# Patient Record
Sex: Male | Born: 1975 | Race: White | Hispanic: No | Smoking: Former smoker
Health system: Southern US, Community
[De-identification: ages and names within clinical notes are randomized; demographics above are authoritative.]

## PROBLEM LIST (undated history)

## (undated) DIAGNOSIS — E079 Disorder of thyroid, unspecified: Secondary | ICD-10-CM

## (undated) DIAGNOSIS — K589 Irritable bowel syndrome without diarrhea: Secondary | ICD-10-CM

## (undated) DIAGNOSIS — Z8489 Family history of other specified conditions: Secondary | ICD-10-CM

## (undated) DIAGNOSIS — I10 Essential (primary) hypertension: Secondary | ICD-10-CM

## (undated) DIAGNOSIS — E119 Type 2 diabetes mellitus without complications: Secondary | ICD-10-CM

## (undated) DIAGNOSIS — K219 Gastro-esophageal reflux disease without esophagitis: Secondary | ICD-10-CM

## (undated) DIAGNOSIS — G4730 Sleep apnea, unspecified: Secondary | ICD-10-CM

## (undated) DIAGNOSIS — Z87442 Personal history of urinary calculi: Secondary | ICD-10-CM

## (undated) DIAGNOSIS — M109 Gout, unspecified: Secondary | ICD-10-CM

## (undated) DIAGNOSIS — E785 Hyperlipidemia, unspecified: Secondary | ICD-10-CM

## (undated) HISTORY — DX: Gout, unspecified: M10.9

## (undated) HISTORY — DX: Sleep apnea, unspecified: G47.30

## (undated) HISTORY — PX: HERNIA REPAIR: SHX51

## (undated) HISTORY — PX: UPPER GASTROINTESTINAL ENDOSCOPY: SHX188

## (undated) HISTORY — DX: Disorder of thyroid, unspecified: E07.9

## (undated) HISTORY — DX: Essential (primary) hypertension: I10

## (undated) HISTORY — DX: Gastro-esophageal reflux disease without esophagitis: K21.9

## (undated) HISTORY — PX: COLONOSCOPY: SHX174

## (undated) HISTORY — DX: Hyperlipidemia, unspecified: E78.5

## (undated) HISTORY — DX: Type 2 diabetes mellitus without complications: E11.9

## (undated) HISTORY — DX: Irritable bowel syndrome without diarrhea: K58.9

---

## 2004-01-29 ENCOUNTER — Encounter: Payer: Self-pay | Admitting: Internal Medicine

## 2007-04-16 ENCOUNTER — Ambulatory Visit: Payer: Self-pay | Admitting: Internal Medicine

## 2007-05-08 ENCOUNTER — Ambulatory Visit: Payer: Self-pay | Admitting: Internal Medicine

## 2007-05-08 ENCOUNTER — Encounter: Payer: Self-pay | Admitting: Internal Medicine

## 2007-05-11 ENCOUNTER — Encounter: Payer: Self-pay | Admitting: Internal Medicine

## 2007-05-11 DIAGNOSIS — K219 Gastro-esophageal reflux disease without esophagitis: Secondary | ICD-10-CM

## 2007-05-11 HISTORY — DX: Gastro-esophageal reflux disease without esophagitis: K21.9

## 2007-05-18 LAB — CONVERTED CEMR LAB
AST: 29 units/L (ref 0–37)
Albumin: 4.4 g/dL (ref 3.5–5.2)
Alkaline Phosphatase: 41 units/L (ref 39–117)
BUN: 16 mg/dL (ref 6–23)
Basophils Absolute: 0 10*3/uL (ref 0.0–0.1)
Basophils Relative: 0.5 % (ref 0.0–1.0)
Bilirubin, Direct: 0.2 mg/dL (ref 0.0–0.3)
Chloride: 104 meq/L (ref 96–112)
Cholesterol: 166 mg/dL (ref 0–200)
Eosinophils Absolute: 0.2 10*3/uL (ref 0.0–0.6)
Eosinophils Relative: 2.2 % (ref 0.0–5.0)
Hemoglobin: 15.3 g/dL (ref 13.0–17.0)
MCV: 85 fL (ref 78.0–100.0)
Monocytes Absolute: 0.7 10*3/uL (ref 0.2–0.7)
Neutro Abs: 4.7 10*3/uL (ref 1.4–7.7)
Potassium: 4.3 meq/L (ref 3.5–5.1)
RDW: 12.4 % (ref 11.5–14.6)
Total Bilirubin: 1 mg/dL (ref 0.3–1.2)
Total CHOL/HDL Ratio: 7.3
Total Protein: 7.2 g/dL (ref 6.0–8.3)

## 2007-06-04 ENCOUNTER — Encounter: Payer: Self-pay | Admitting: Internal Medicine

## 2007-06-08 ENCOUNTER — Ambulatory Visit: Payer: Self-pay | Admitting: Internal Medicine

## 2007-06-08 DIAGNOSIS — R109 Unspecified abdominal pain: Secondary | ICD-10-CM | POA: Insufficient documentation

## 2007-06-09 ENCOUNTER — Encounter: Admission: RE | Admit: 2007-06-09 | Discharge: 2007-06-09 | Payer: Self-pay | Admitting: Internal Medicine

## 2007-06-11 ENCOUNTER — Telehealth (INDEPENDENT_AMBULATORY_CARE_PROVIDER_SITE_OTHER): Payer: Self-pay | Admitting: *Deleted

## 2007-06-22 ENCOUNTER — Ambulatory Visit: Payer: Self-pay | Admitting: Gastroenterology

## 2007-06-22 LAB — CONVERTED CEMR LAB
Lipase: 22 units/L (ref 11.0–59.0)
Sed Rate: 9 mm/hr (ref 0–20)

## 2007-07-10 ENCOUNTER — Ambulatory Visit: Payer: Self-pay | Admitting: Gastroenterology

## 2007-07-10 ENCOUNTER — Encounter: Payer: Self-pay | Admitting: Internal Medicine

## 2007-07-10 ENCOUNTER — Encounter: Payer: Self-pay | Admitting: Gastroenterology

## 2007-07-23 ENCOUNTER — Ambulatory Visit: Payer: Self-pay | Admitting: Gastroenterology

## 2007-08-10 ENCOUNTER — Telehealth: Payer: Self-pay | Admitting: Internal Medicine

## 2007-08-12 ENCOUNTER — Telehealth (INDEPENDENT_AMBULATORY_CARE_PROVIDER_SITE_OTHER): Payer: Self-pay | Admitting: *Deleted

## 2007-08-26 ENCOUNTER — Ambulatory Visit: Payer: Self-pay | Admitting: Internal Medicine

## 2007-08-29 ENCOUNTER — Observation Stay (HOSPITAL_COMMUNITY): Admission: EM | Admit: 2007-08-29 | Discharge: 2007-08-30 | Payer: Self-pay | Admitting: Emergency Medicine

## 2007-08-29 ENCOUNTER — Ambulatory Visit: Payer: Self-pay | Admitting: Cardiology

## 2007-08-31 ENCOUNTER — Ambulatory Visit: Payer: Self-pay | Admitting: Internal Medicine

## 2007-09-21 ENCOUNTER — Encounter: Payer: Self-pay | Admitting: Internal Medicine

## 2007-11-30 ENCOUNTER — Encounter: Payer: Self-pay | Admitting: Internal Medicine

## 2007-12-01 ENCOUNTER — Encounter: Payer: Self-pay | Admitting: Internal Medicine

## 2008-01-04 ENCOUNTER — Telehealth: Payer: Self-pay | Admitting: Internal Medicine

## 2008-01-05 ENCOUNTER — Encounter: Payer: Self-pay | Admitting: Internal Medicine

## 2008-01-08 DIAGNOSIS — G4733 Obstructive sleep apnea (adult) (pediatric): Secondary | ICD-10-CM | POA: Insufficient documentation

## 2008-01-08 DIAGNOSIS — K589 Irritable bowel syndrome without diarrhea: Secondary | ICD-10-CM | POA: Insufficient documentation

## 2008-01-08 DIAGNOSIS — I10 Essential (primary) hypertension: Secondary | ICD-10-CM | POA: Insufficient documentation

## 2008-01-08 DIAGNOSIS — G4730 Sleep apnea, unspecified: Secondary | ICD-10-CM

## 2008-01-08 HISTORY — DX: Irritable bowel syndrome, unspecified: K58.9

## 2008-01-08 HISTORY — DX: Sleep apnea, unspecified: G47.30

## 2008-09-07 ENCOUNTER — Telehealth: Payer: Self-pay | Admitting: Internal Medicine

## 2008-09-08 ENCOUNTER — Ambulatory Visit: Payer: Self-pay | Admitting: Internal Medicine

## 2008-09-08 DIAGNOSIS — I10 Essential (primary) hypertension: Secondary | ICD-10-CM

## 2008-09-08 DIAGNOSIS — L255 Unspecified contact dermatitis due to plants, except food: Secondary | ICD-10-CM | POA: Insufficient documentation

## 2008-09-08 HISTORY — DX: Essential (primary) hypertension: I10

## 2008-09-13 ENCOUNTER — Telehealth: Payer: Self-pay | Admitting: Internal Medicine

## 2008-10-10 ENCOUNTER — Ambulatory Visit: Payer: Self-pay | Admitting: Internal Medicine

## 2008-10-10 DIAGNOSIS — E079 Disorder of thyroid, unspecified: Secondary | ICD-10-CM

## 2008-10-10 HISTORY — DX: Disorder of thyroid, unspecified: E07.9

## 2008-10-11 ENCOUNTER — Telehealth: Payer: Self-pay | Admitting: Internal Medicine

## 2008-10-11 LAB — CONVERTED CEMR LAB: TSH: 2.02 u[IU]/mL

## 2009-04-05 ENCOUNTER — Encounter: Payer: Self-pay | Admitting: Internal Medicine

## 2009-04-10 ENCOUNTER — Ambulatory Visit: Payer: Self-pay | Admitting: Internal Medicine

## 2009-05-03 ENCOUNTER — Encounter: Payer: Self-pay | Admitting: Internal Medicine

## 2009-05-16 ENCOUNTER — Encounter: Payer: Self-pay | Admitting: Internal Medicine

## 2009-06-05 ENCOUNTER — Ambulatory Visit: Payer: Self-pay | Admitting: Internal Medicine

## 2009-09-05 ENCOUNTER — Ambulatory Visit: Payer: Self-pay | Admitting: Internal Medicine

## 2009-09-05 DIAGNOSIS — M109 Gout, unspecified: Secondary | ICD-10-CM

## 2009-09-05 HISTORY — DX: Gout, unspecified: M10.9

## 2009-09-05 LAB — CONVERTED CEMR LAB
TSH: 3.64 microintl units/mL (ref 0.35–5.50)
Uric Acid, Serum: 9.4 mg/dL — ABNORMAL HIGH (ref 4.0–7.8)

## 2009-09-08 ENCOUNTER — Telehealth: Payer: Self-pay | Admitting: Internal Medicine

## 2009-10-30 ENCOUNTER — Ambulatory Visit: Payer: Self-pay | Admitting: Internal Medicine

## 2010-04-13 ENCOUNTER — Ambulatory Visit: Payer: Self-pay | Admitting: Internal Medicine

## 2010-11-19 ENCOUNTER — Ambulatory Visit
Admission: RE | Admit: 2010-11-19 | Discharge: 2010-11-19 | Payer: Self-pay | Source: Home / Self Care | Attending: Internal Medicine | Admitting: Internal Medicine

## 2010-11-28 NOTE — Assessment & Plan Note (Signed)
Summary: PAIN IN R KNEE (GOUT?) // RS   Vital Signs:  Patient profile:   35 year old male Weight:      276 pounds Temp:     98.2 degrees F oral BP sitting:   120 / 90  (left arm) Cuff size:   regular  Vitals Entered By: Kathrynn Speed CMA (April 13, 2010 10:53 AM) CC: Pian in right knee / gout?   CC:  Pian in right knee / gout?Kyle [name]  History of Present Illness: 35 year old patient has a history of gout and also a history of treated hypertension.  For the past few days.  He has had a traumatic right knee pain and swelling.  He has had prior gout in this knee.  He states that over the years.  He has had probably 10 episodes of acute gouty arthritis.  Her uric acyl level was checked about 7 months ago and was 9.3.  Preventative medications were discussed with the wishes to avoid further medications.  He has been using indomethacin, which has not been very effective and has caused some mild stomach upset.  Current Medications (verified): 1)  Azor 5-40 Mg Tabs (Amlodipine-Olmesartan) .... One Daily 2)  Indomethacin 50 Mg Caps (Indomethacin) .... As Needed  Allergies (verified): 1)  ! Penicillin G Pot in Dextrose (Penicillin G Potassium in D5w)  Physical Exam  General:  overweight-appearing.  130/90overweight-appearing.   Msk:  right knee was minimally swollen, warm to touch and slightly tender   Impression & Recommendations:  Problem # 1:  GOUT, UNSPECIFIED (ICD-274.9)  Problem # 2:  ESSENTIAL HYPERTENSION, BENIGN (ICD-401.1)  His updated medication list for this problem includes:    Azor 5-40 Mg Tabs (Amlodipine-olmesartan) ..... One daily  His updated medication list for this problem includes:    Azor 5-40 Mg Tabs (Amlodipine-olmesartan) ..... One daily  Complete Medication List: 1)  Azor 5-40 Mg Tabs (Amlodipine-olmesartan) .... One daily 2)  Indomethacin 50 Mg Caps (Indomethacin) .... As needed 3)  Prednisone 20 Mg Tabs (Prednisone) .... One twice daily  Patient  Instructions: 1)  Please schedule a follow-up appointment in 6 months. 2)  Please schedule a follow-up appointment as needed. 3)  Limit your Sodium (Salt) to less than 2 grams a day(slightly less than 1/2 a teaspoon) to prevent fluid retention, swelling, or worsening of symptoms. Prescriptions: PREDNISONE 20 MG TABS (PREDNISONE) one twice daily  #20 x 0   Entered and Authorized by:   Gordy Savers  MD   Signed by:   Gordy Savers  MD on 04/13/2010   Method used:   Print then Give to Patient   RxID:   [id number] INDOMETHACIN 50 MG CAPS (INDOMETHACIN) as needed  #50 x 2   Entered and Authorized by:   Gordy Savers  MD   Signed by:   Gordy Savers  MD on 04/13/2010   Method used:   Print then Give to Patient   RxID:   [id number] AZOR 5-40 MG TABS (AMLODIPINE-OLMESARTAN) one daily  #90 x 6   Entered and Authorized by:   Gordy Savers  MD   Signed by:   Gordy Savers  MD on 04/13/2010   Method used:   Print then Give to Patient   RxID:   [id number]   Appended Document: PAIN IN R KNEE (GOUT?) // RS     Vitals Entered By: Duard Brady LPN (April 13, 2010 12:56 PM)  Allergies: 1)  ! Penicillin G Pot in  Dextrose (Penicillin G Potassium in D5w)   Complete Medication List: 1)  Azor 5-40 Mg Tabs (Amlodipine-olmesartan) .... One daily 2)  Indomethacin 50 Mg Caps (Indomethacin) .... As needed 3)  Prednisone 20 Mg Tabs (Prednisone) .... One twice daily  Other Orders: Depo- Medrol 80mg  (J1040) Admin of Therapeutic Inj  intramuscular or subcutaneous ([address])    Medication Administration  Injection # 1:    Medication: Depo- Medrol 80mg     Diagnosis: GOUT, UNSPECIFIED (ICD-274.9)    Route: IM    Site: RUOQ gluteus    Exp Date: 11.2013    Lot #: [id number]    Mfr: Pharmacia    Patient tolerated injection without complications    Given by: Duard Brady LPN (April 13, 2010 12:59 PM)  Orders Added: 1)  Depo- Medrol 80mg   [J1040] 2)  Admin of Therapeutic Inj  intramuscular or subcutaneous [60454]

## 2010-11-28 NOTE — Assessment & Plan Note (Signed)
Summary: 6 month rov/njr pt rsc/njr   Vital Signs:  [name] profile:   35 year old male Weight:      264 pounds BMI:     35.93 Temp:     97.5 degrees F oral BP sitting:   124 / 94  (left arm) Cuff size:   large  Vitals Entered By: Raechel Ache, RN (October 30, 2009 8:20 AM) CC: 6 mo ROV; still has gout pain both feet.   CC:  6 mo ROV; still has gout pain both feet.Marland Kitchen  History of Present Illness: Kyle [name] who is seen today for follow-up of his hypertension.  Last visit, diuretic therapy was discontinued and Azor 5-40 substituted.  He continues to have achiness primarily in the ankles of both feet.  Last month.  He was felt to have probable gout with inflammatory changes involving his right ankle.  He has tolerated his medication well. Since his last visit here.  He is adopted two children  Allergies: 1)  ! Penicillin G Pot in Dextrose (Penicillin G Potassium in D5w)  Past History:  Past Medical History: Reviewed history from 09/05/2009 and no changes required. GERD  Current Problems:  HYPERTENSION (ICD-401.9) SLEEP APNEA (ICD-780.57)-mild IBS (ICD-564.1) ABDOMINAL PAIN (ICD-789.00) FAMILY HISTORY OF CAD MALE 1ST DEGREE RELATIVE <60 (ICD-V16.49) GERD (ICD-530.81)  probable gout  Review of Systems  The [name] denies anorexia, fever, weight loss, weight gain, vision loss, decreased hearing, hoarseness, chest pain, syncope, dyspnea on exertion, peripheral edema, prolonged cough, headaches, hemoptysis, abdominal pain, melena, hematochezia, severe indigestion/heartburn, hematuria, incontinence, genital sores, muscle weakness, suspicious skin lesions, transient blindness, difficulty walking, depression, unusual weight change, abnormal bleeding, enlarged lymph nodes, angioedema, breast masses, and testicular masses.    Physical Exam  General:  mildly overweight, but pressure 140/94 Msk:   no inflammatory changes noted   Impression & Recommendations:  Problem  # 1:  GOUT, UNSPECIFIED (ICD-274.9)  Problem # 2:  ESSENTIAL HYPERTENSION, BENIGN (ICD-401.1)  His updated medication list for this problem includes:    Azor 5-40 Mg Tabs (Amlodipine-olmesartan) ..... One daily  His updated medication list for this problem includes:    Azor 5-40 Mg Tabs (Amlodipine-olmesartan) ..... One daily  Complete Medication List: 1)  Protonix 40 Mg Pack (Pantoprazole sodium) .Marland Kitchen.. 1 two times a day 2)  Zyrtec Allergy 10 Mg Tabs (Cetirizine hcl) .Marland Kitchen.. 1 once daily as needed 3)  Sucralfate 1 Gm Tabs (Sucralfate) .Marland Kitchen.. 1 qid 4)  Azor 5-40 Mg Tabs (Amlodipine-olmesartan) .... One daily 5)  Indomethacin 50 Mg Caps (Indomethacin) .Marland Kitchen.. 1 three times a day  Other Orders: Prescription Created Electronically [phone])  [name] Instructions: 1)  Limit your Sodium (Salt). 2)  It is important that you exercise regularly at least 20 minutes 5 times a week. If you develop chest pain, have severe difficulty breathing, or feel very tired , stop exercising immediately and seek medical attention. 3)  Please schedule a follow-up appointment in 3 months. Prescriptions: INDOMETHACIN 50 MG CAPS (INDOMETHACIN) 1 three times a day  #90 x 2   Entered and Authorized by:   Gordy Savers  MD   Signed by:   Gordy Savers  MD on 10/30/2009   Method used:   Print then Give to [name]   RxID:   [id number] AZOR 5-40 MG TABS (AMLODIPINE-OLMESARTAN) one daily  #90 x 6   Entered and Authorized by:   Gordy Savers  MD   Signed by:   Gordy Savers  MD on 10/30/2009   Method used:   Print then Give to [name]   RxID:   [id number] PROTONIX 40 MG  PACK (PANTOPRAZOLE SODIUM) 1 two times a day  #180 x 6   Entered and Authorized by:   Gordy Savers  MD   Signed by:   Gordy Savers  MD on 10/30/2009   Method used:   Print then Give to [name]   RxID:   [id number] INDOMETHACIN 50 MG CAPS (INDOMETHACIN) 1 three times a day  #90 x 2   Entered and  Authorized by:   Gordy Savers  MD   Signed by:   Gordy Savers  MD on 10/30/2009   Method used:   Electron[facility] to        [facility] (retail)       [address]       [place], [place]  [address]       Ph: [phone] or [phone]       Fax: [phone]   RxID:   [id number] AZOR 5-40 MG TABS (AMLODIPINE-OLMESARTAN) one daily  #90 x 6   Entered and Authorized by:   Gordy Savers  MD   Signed by:   Gordy Savers  MD on 10/30/2009   Method used:   [facility]        [facility] (retail)       [address]       [place], [place]  [address]       Ph: [phone] or [phone]       Fax: [phone]   RxID:   [id number] PROTONIX 40 MG  PACK (PANTOPRAZOLE SODIUM) 1 two times a day  #180 x 6   Entered and Authorized by:   Gordy Savers  MD   Signed by:   Gordy Savers  MD on 10/30/2009   Method used:   [facility]        [facility] (retail)       [address]       [place], [place]  [address]       Ph: [phone] or [phone]       Fax: [phone]   RxID:   [id number]

## 2010-11-29 NOTE — Assessment & Plan Note (Signed)
Summary: ? gout//ccm   Vital Signs:  Patient profile:   35 year old male Weight:      284 pounds Temp:     99.6 degrees F oral BP sitting:   130 / 80  (left arm) Cuff size:   large  Vitals Entered By: Duard Brady LPN (November 19, 2010 3:30 PM) CC: c/o pain and ?swelling to ()R) foot x1wk Is Patient Diabetic? No   CC:  c/o pain and ?swelling to ()R) foot x1wk.  History of Present Illness: 35 year old C. history of treated hypertension.  He has exogenous obesity.  He also has a history of hyperuricemia and recurrent gouty arthritis.  This has affected his right foot and ankle on at least 3 prior episodes.  It is also affected his knee.  For the past week.  He has had increasing pain involving his lateral foot area.  He has been using indomethacin and he had a few pills of prednisone and he is much improved  Allergies: 1)  ! Penicillin G Pot in Dextrose (Penicillin G Potassium in D5w)  Past History:  Past Medical History: GERD  Current Problems:  HYPERTENSION (ICD-401.9) SLEEP APNEA (ICD-780.57)-mild IBS (ICD-564.1) ABDOMINAL PAIN (ICD-789.00) FAMILY HISTORY OF CAD MALE 1ST DEGREE RELATIVE <60 (ICD-V16.49) GERD (ICD-530.81)  GOUT   Review of Systems       The patient complains of difficulty walking.  The patient denies anorexia, fever, weight loss, weight gain, vision loss, decreased hearing, hoarseness, chest pain, syncope, dyspnea on exertion, peripheral edema, prolonged cough, headaches, hemoptysis, abdominal pain, melena, hematochezia, severe indigestion/heartburn, hematuria, incontinence, genital sores, muscle weakness, suspicious skin lesions, transient blindness, depression, unusual weight change, abnormal bleeding, enlarged lymph nodes, angioedema, breast masses, and testicular masses.    Physical Exam  General:  overweight-appearing.  130/80overweight-appearing.   Msk:  tenderness along the dorsal and lateral aspect of the right foot Pulses:  R and L  carotid,radial,femoral,dorsalis pedis and posterior tibial pulses are full and equal bilaterally   Impression & Recommendations:  Problem # 1:  GOUT, UNSPECIFIED (ICD-274.9)  His updated medication list for this problem includes:    Allopurinol 300 Mg Tabs (Allopurinol) ..... One daily  His updated medication list for this problem includes:    Allopurinol 300 Mg Tabs (Allopurinol) ..... One daily  Problem # 2:  HYPERTENSION (ICD-401.9)  His updated medication list for this problem includes:    Azor 5-40 Mg Tabs (Amlodipine-olmesartan) ..... One daily  His updated medication list for this problem includes:    Azor 5-40 Mg Tabs (Amlodipine-olmesartan) ..... One daily  Complete Medication List: 1)  Azor 5-40 Mg Tabs (Amlodipine-olmesartan) .... One daily 2)  Indomethacin 50 Mg Caps (Indomethacin) .... As needed 3)  Prednisone 20 Mg Tabs (Prednisone) .... One twice daily 4)  Allopurinol 300 Mg Tabs (Allopurinol) .... One daily  Patient Instructions: 1)  Limit your Sodium (Salt) to less than 2 grams a day(slightly less than 1/2 a teaspoon) to prevent fluid retention, swelling, or worsening of symptoms. 2)  It is important that you exercise regularly at least 20 minutes 5 times a week. If you develop chest pain, have severe difficulty breathing, or feel very tired , stop exercising immediately and seek medical attention. 3)  You need to lose weight. Consider a lower calorie diet and regular exercise.  Prescriptions: ALLOPURINOL 300 MG TABS (ALLOPURINOL) one daily  #90 x 4   Entered and Authorized by:   Gordy Savers  MD   Signed by:  Gordy Savers  MD on 11/19/2010   Method used:   [facility]        [facility] ([facility]ail)       [address]       [place], [place]  [address]       Ph: [phone] or [phone]       Fax: [phone]   RxID:   [id number] PREDNISONE 20 MG TABS (PREDNISONE) one twice daily  #20 x 1   Entered and Authorized  by:   Gordy Savers  MD   Signed by:   Gordy Savers  MD on 11/19/2010   Method used:   [facility]        [id number]       [address]       [place], [place]  [address]       Ph: [phone] or [phone]       Fax: [phone]   RxID:   [id number]    Orders Added: 1)  Est. Patient Level III [[address]]

## 2011-03-12 NOTE — Assessment & Plan Note (Signed)
[facility] OFFICE NOTE   Kyle, [name]                         MRN:          [id number]  DATE:04/16/2007                            DOB:          [birth year]    A 35 year old gentleman who was seen today to establish with our  practice.  He has a history of gastroesophageal reflux disease, and has  been on Protonix for a few months.  The past 3 days he has had  increasing epigastric pain, especially nocturnal, in spite of  medications.  He describes some bloating, belching, minimal nausea, but  no active emesis.  No change in his bowel habits.  There does not appear  to be any new stressors in his life, although he has recently relocated,  and does have a new job.  He is married without children.  Nondrinker  and nonsmoker.   PAST MEDICAL HISTORY:  Fairly unremarkable.  There has been no history  of peptic ulcer disease.  He had a hernia repair in the year 2000 in  [place].   HE HAS A PENICILLIN ALLERGY.   Medical regimen includes Protonix 40 mg daily.   FAMILY HISTORY:  Father is age 62 with a history of prostate cancer.  He  is status post CABG, and has a thyroid disorder.  Mother age 78 also has  coronary artery disease, status post prior MI.  Two half sisters, 1 with  history of cervical cancer.   EXAMINATION:  Reveals an overweight male that appears well.  No acute  distress.  Blood pressure was 140/90.  Weight 255.  Fundi, ears, nose, and throat clear.  NECK:  No bruits or adenopathy.  No thyroid enlargement.  CHEST:  Clear.  CARDIOVASCULAR EXAM:  Normal heart sounds.  No murmurs.  ABDOMEN:  Obese, soft and non-tender.  No organomegaly.  No bruits  appreciated.  EXTERNAL GENITALIA:  Normal.  RECTAL EXAM:  No obvious hemorrhoids.  Stool Hemoccult negative.  EXTREMITIES:  Negative with full peripheral pulses.   IMPRESSION:  1. Abdominal pain.  2. Probable gastroesophageal reflux disease.   DISPOSITION:  We will place the patient on aggressive antireflux regimen  including weight loss.  Instructions were dispensed.  For the short  term, we will increase his Protonix to a b.i.d.  regimen.  He will be rechecked in 2 weeks, and will have a fasting  laboratory panel done at that  time.  Depending on his clinical response, we will consider additional  testing for H. pylori, possible gallbladder disease, etc., depending on  his clinical response.     Gordy Savers, MD  Electronically Signed    PFK/MedQ  DD: 04/16/2007  DT: 04/16/2007  Job #: [id number]

## 2011-03-12 NOTE — Assessment & Plan Note (Signed)
[facility]                         GASTROENTEROLOGY OFFICE NOTE   NAME:Kyle [name], Kyle [name]                         MRN:          [id number]  DATE:07/23/2007                            DOB:          1976-01-08    The patient returns and is still having episodes of subxiphoid pain  rather severe, that last up to an hour in duration, usually occurring at  night without other precipitating or alleviating elements.  There are no  specific hepatobiliary, other gastrointestinal, or any cardiovascular  pulmonary symptoms related to this pain.  He has been on twice a day  Pantoprazole and also twice a day anticholinergic therapy with Levsin  without improvement.  He does have chronic underlying IBS with  alternating diarrhea and constipation.  As per previous notes, previous  ultrasound examination was apparently unremarkable, although, I do not  have that specific report.  I did endoscopy and colonoscopy on July 10, 2007, both of which were entirely normal.   LABORATORY DATA:  Pattern consistent with inflammatory bowel disease  with positive ASCA IgA and IgG antibodies.  Serologic markers for celiac  disease were unremarkable.  Small bowel biopsy also was obtained and  showed no evidence of abnormalities.   PHYSICAL EXAMINATION:  VITAL SIGNS:  The patient weighs 245 pounds,  blood pressure 132/98.  Pulse 68 and regular.  I cannot see where he has  had previous hypertension.  ABDOMEN:  Unremarkable.  HEART:  Regular rhythm without murmurs, rubs, or gallops.  CHEST:  Clear.   ASSESSMENT:  1. Chronic irritable bowel syndrome with alternating diarrhea and      constipation.  He has positive serological panel for Crohn's.  It      is interesting and should be kept in mind, although, I do not think      he has active inflammatory bowel disease at this point.  2. Recurrent attacks of subxiphoid pain despite twice a day PPI      therapy.  Etiology diagnoses  should include dysfunctional      gallbladder, esophageal spasm, or functional symptomatology.  3. Borderline essential hypertension which needs to be followed.  4. History of lactose intolerance.   RECOMMENDATIONS:  1. Check CCK-HIDA hepatobiliary scanning to see if he has gallbladder      dysfunction.  2. Should CCK-HIDA scan be normal, we will proceed with esophageal      monometry and 24-hour pH probe testing.  3. Consider small bowel enterocapsular examination depending on      clinical course.  4. Continue empiric reflux treatment with twice a day Protonix until      workup completed.     Vania Rea. Jarold Motto, MD, Caleen Essex, FAGA  Electronically Signed    DRP/MedQ  DD: 07/23/2007  DT: 07/23/2007  Job #: [id number]   cc:   Gordy Savers, MD

## 2011-03-12 NOTE — H&P (Signed)
NAME:  Kyle [name], Kyle [name] NO.:  [id number]   MEDICAL RECORD NO.:  [id number]          PATIENT TYPE:  EMS   LOCATION:  [facility]                         FACILITY:  [facility]   PHYSICIAN:  Dorian Pod, ACNP  DATE OF BIRTH:  30-Apr-1976   DATE OF ADMISSION:  08/29/2007  DATE OF DISCHARGE:                              HISTORY & PHYSICAL   PRIMARY CARDIOLOGIST:  New, being seen by Dr. Olga Millers.   PRIMARY CARE:  Eleonore Chiquito.   GI:  Dr. Jarold Motto.   Mr. Lindstrom is a 35 year old Caucasian gentleman with no known history of  coronary artery disease, who presents to [facility] Emergency  Department today complaining of chest discomfort x3 days.  Mr. Purdy has  no previous cardiac workup.  He has been worked up, however, for ongoing  irritable bowel syndrome type symptoms.  He states he underwent an  endoscopy and colonoscopy through [facility] in September.  In  discussion with the patient and his wife, he states everything was  negative. However, in pulling up office notes from Dr. Jarold Motto,  questionable possibility of Crohn disease.  The patient states he was  not pleased with the service he received through the GI office and has  an appointment with [facility] later this month for a workup.  Mr.  Calleros also complains of rectal bleeding x2 days; one episode on Thursday  and 2 episodes on Friday.  He states episodes occurred with change in  consistency of stool, although Mr. Littler states his irritable bowel  symptoms can be diarrhea or constipation.  Not very clear as to what the  episodes entail, as he is rather in answering then, but apparently he  has chronic abdominal pain, chronic GERD-type symptoms with a lot of  reflux, acid indigestion.  He states this chest discomfort he had 3 days  ago, however, was different from his normal reflux symptoms, although it  does seem to correlate with his abdominal discomfort.  He describes it  as a localized burning in  his substernal area with increased belching.  He states it started Thursday night after he had gone to bed.  He took  some Pepto-Bismol without relief, but finally fell asleep.  Friday, he  had a bloody stool.  The chest discomfort returned.  He described it as  a burning epigastric discomfort.  He also associated it with a headache.  The chest discomfort lasted around an hour and then resolved.  This  morning, he was watching TV.  He had some chest discomfort again.  He  described it as a soreness, also associated with constant abdominal  pain.  He does complain of increased fatigue this morning.  With the  chest discomfort, he decided to have his wife check his blood pressure  and his blood pressure was 160/100.  He states he has never been treated  for hypertension but Dr. Kirtland Bouchard just watches his blood pressure because it  has been mildly elevated in the past; mildly meaning 130s/90s.  Mr.  Lamountain is in no acute distress at this time, denying any current  discomfort.  However, when he initially arrived in the emergency room he  complained of chest discomfort, rate of 3 or 4 on a scale of 1-10.  He  states it was relieved with aspirin and nitroglycerin sublingual.   ALLERGIES:  PENICILLIN.   MEDICATIONS:  1. Protonix b.i.d.  2. Hyomax-SR 0.375 mg b.i.d.   PAST MEDICAL HISTORY:  Previous EGD colonoscopy, questionable Crohn  disease, pending further workup at [facility], and borderline  hypertension.   SOCIAL HISTORY:  He lives in [place] with his wife.  He is an  Art gallery manager.  Denies any tobacco, EtOH, drugs or herbal medication use.  He  tries to follow a bland diet.   FAMILY HISTORY:  Both parents alive.  Mother had an MI in her 35s.  Father with CABG in his 35s.   REVIEW OF SYSTEMS:  Positive for a 30-pound weight loss in 6 months,  unintentional; occasional headaches; chest discomfort as described  above; abdominal discomfort; diarrhea; constipation; abdominal pain;   bloody stool x2 days.   PHYSICAL EXAMINATION:  Temperature 97.3; heart rate initially 105,  currently 78; respirations 18; blood pressure 159/93.  Mr. Furtick is in no acute distress.  No a jugular vein distention.  No carotid bruits.  HEENT:  Unremarkable.  CARDIOVASCULAR:  Exam reveals S1 and S2, regular rate and rhythm.  LUNGS:  Clear to auscultation.  SKIN:  Warm and dry.  ABDOMEN:  Soft, nontender, positive bowel sounds.  LOWER EXTREMITIES: Without clubbing, cyanosis or edema.  NEUROLOGIC:  Alert and oriented x3.   Chest x-ray showing no acute findings.  EKG sinus rhythm at a rate of  99.   LABORATORY DATA:  H&H 15.2 and 43.5, WBC 6.1, platelets 222,000.  Sodium  141, potassium 3.9, BUN and creatinine 8 and 0.9 with glucose 116.  Dr.  Olga Millers in to examine and assess patient with complaints of chest  discomfort, somewhat atypical with ongoing symptoms of GERD, irritable  bowel syndrome, now with x3 episodes of bright-red blood in stool.  EKG:  Normal sinus rhythm with no ST changes.   The patient will be admitted for observation, rule out MI.  No further  cardiac workup.  If enzymes are negative, continue Protonix.  Recheck  CBC in the a.m.  Follow up outpatient with GI.  Discontinue aspirin.      Dorian Pod, ACNP     MB/MEDQ  D:  08/29/2007  T:  08/31/2007  Job:  [id number]

## 2011-03-12 NOTE — Discharge Summary (Signed)
[name], [name]                ACCOUNT NO.:  [id number]   MEDICAL RECORD NO.:  [id number]          PATIENT TYPE:  OBV   LOCATION:  3735                         FACILITY:  [facility]   PHYSICIAN:  Madolyn Frieze. Jens Som, MD, FACCDATE OF BIRTH:  [birth year]   DATE OF ADMISSION:  08/29/2007  DATE OF DISCHARGE:  08/30/2007                               DISCHARGE SUMMARY   DISCHARGING PHYSICIAN:  Dr. Olga Millers.   PRIMARY CARDIOLOGIST:  New, has been seen by Dr. Jens Som.   PRIMARY CARE PHYSICIAN:  Dr. Eleonore Chiquito.   GASTROENTEROLOGIST:  Dr. Jarold Motto.   DISCHARGE DIAGNOSES:  1. Chest pain, negative cardiac workup, no further cardiac workup      needed at this time.  2. Gastroesophageal reflux disease.  3. Irritable bowel syndrome.  4. Questionable Crohn disease with hematochezia.  5. Borderline hypertension.   HOSPITAL COURSE:  [name] is a 35 year old Caucasian gentleman with no  known history of coronary artery disease, who presents to [facility]  Emergency Room complaining of chest pain for 3 days, rather atypical  chest discomfort; he states it is different from his GERD symptoms.  Chest pain is described as burning associated with increased belching  and abdominal discomfort, patient complaining of 3 episodes of bright  red blood in stool over the last 2 days with ongoing abdominal  discomfort, GERD symptoms and 30-pound weight loss over this last 6  months, in the process of being worked up by GI, also pending further  evaluation by GI Department a [facility] later this month, patient  admitted for observation.  No further episodes of chest discomfort, no  loss of blood during the admission.  Patient is being discharged home  with instructions to follow up with GI at [facility] as scheduled.  No  further cardiac workup at this time.   LABORATORY WORK AT TIME OF DISCHARGE:  H&H 14 and 39.3, WBC 6.3,  platelets 210,000.  Potassium 4.2, BUN and creatinine 10 and  0.87.   DURATION OF DISCHARGE ENCOUNTER:  Less than 30 minutes.      Dorian Pod, ACNP      Madolyn Frieze. Jens Som, MD, [facility]  Electronically Signed    MB/MEDQ  D:  08/30/2007  T:  08/31/2007  Job:  [id number]   cc:   Vania Rea. Jarold Motto, MD, [facility]aleen Essex, FAGA

## 2011-03-12 NOTE — Assessment & Plan Note (Signed)
[facility]                         GASTROENTEROLOGY OFFICE NOTE   JADRIEL, SAXER                         MRN:          [id number]  DATE:06/22/2007                            DOB:          Mar 30, 1976    HISTORY:  [name], Kyle [name]. 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 Gordy Savers for  evaluation of epigastric abdominal pain.  The patient has had acid  reflux for several years, initially treated by a physician in [place],  [place].  He has been on Prilosec and/or Protonix but saw Dr.  Amador Cunas approximately one month ago, with periodic episodes of  rather acute abdominal discomfort.  These pains were in the epigastric  area and associated with mild nausea but no emesis, no hepatobiliary  complaints or symptoms.  The laboratory data was all unremarkable  including an upper abdominal ultrasound examination.  He was placed on  Levsinex twice daily, along with Protonix 40 mg from once a day to twice  daily, and really has been doing somewhat better.  He does have belching  and burping but denies pre-regurgitation or dysphagia.  He has had no  clay-colored stools, dark urine, icterus, fever or chills.   He does have chronic irritable bowel syndrome, alternating diarrhea and  constipation, but denies melena or hematochezia.  He has never had  endoscopic or barium studies.  He has a variety of food intolerance,  including lactose.  He has never been checked for celiac disease.  He  denies any history of osteoporosis, anemia or current skin rashes, but  does have frequent aphthous erosions in his mouth and palate.  He denies  foreign travel and no infectious disease exposure, no use of alcohol,  cigarettes or NSAIDs.  He has had no significant anorexia or weight  loss.   PAST MEDICAL/SURGICAL HISTORY:  1. Remarkable for labile hypertension.  2. Sleep apnea.  3. Previous right inguinal  hernia repair.   MEDICAL DECISION MAKING:  1. Levsin 0.375 mg twice daily.  2. Protonix 40 mg twice daily.  3. Metamucil twice daily.   ALLERGIES:  In the past he has had reaction to PENICILLIN.   FAMILY HISTORY:  Remarkable for an older uncle having colon cancer.  An  aunt with colon polyps.  His father had prostate cancer.   SOCIAL HISTORY:  The patient is married and lives with his wife.  He has  a Curator.  He has never smoked or used ethanol.   REVIEW OF SYSTEMS:  Otherwise noncontributory, without any  cardiovascular, pulmonary, genitourinary, neurological, orthopedic,  endocrine or dermatologic problems.   PHYSICAL EXAMINATION:  GENERAL:  He is a healthy-appearing white male,  in no distress.  He appears his stated age.  VITAL SIGNS:  He is 6 feet 2 inches tall and weighs 245 pounds.  Blood  pressure 132/78, pulse 78 and regular.  HEENT/NECK:  I could not appreciate stigmata of chronic liver disease or  thyromegaly.  He did have a healing erosion on the soft palate.  No  lymphadenopathy noted.  CHEST:  Entirely clear.  HEART:  He was in a regular rhythm without murmurs, gallops or rubs.  ABDOMEN:  There was no hepatosplenomegaly, abdominal mass or tenderness  except in the epigastric area to deep palpation.  Bowel sounds normal.  EXTREMITIES:  Peripheral extremities were unremarkable.  NEUROLOGIC:  Mental status was clear.  RECTAL:  Exam was deferred.   The patient had an ultrasound exam performed at [facility] on June 08, 2007, which was entirely normal except for  increased echogenicity of the liver, suggesting fatty infiltration.  The  pancreas was poorly visualized.   ASSESSMENT:  1. Chronic acid reflux disease - rule out Barrett's mucosa.  2. Recurrent epigastric abdominal pain, of unexplained etiology -      still think it is possible the patient has very small gallstones      which are giving him attacks.  3. History  of chronic irritable bowel syndrome - rule out inflammatory      bowel disease.  4. Lactose intolerance - rule out celiac disease.   RECOMMENDATIONS:  1. Check celiac panel and inflammatory bowel disease panel.  2. Continue current medications per Dr. Amador Cunas.  3. Outpatient endoscopy and colonoscopy as soon as possible.  4. Consider small bowel series.  5. Lactose-free diet as tolerated.     Vania Rea. Jarold Motto, MD, Caleen Essex, FAGA  Electronically Signed    DRP/MedQ  DD: 06/22/2007  DT: 06/23/2007  Job #: [id number]   cc:   Gordy Savers, MD

## 2011-03-15 NOTE — Assessment & Plan Note (Signed)
[facility]                         GASTROENTEROLOGY OFFICE NOTE   DAJOUR, PIERPOINT                         MRN:          [id number]  DATE:07/30/2007                            DOB:          [birth year]    TELEPHONE CALL:  The patient is a patient of Dr. Norval Gable.  He has a working diagnosis  of irritable bowel syndrome.  He had a serologic panel positive for  Crohn's disease.  EGD and colonoscopy were unrevealing.  He and his wife  had called dissatisfied with a lack of clear-cut explanation and in my  roll as continuous quality improvement coordinator for the [facility], I called to answer his questions.  He plans to  transfer his care to Bernette Redbird, M.D., in the future.   He stated that he felt that the medical care was appropriate but he was  left with questions about his diagnosis and the rationale for that.  We  had a conversation about irritable bowel syndrome and Crohn's disease.  I explained to him that it certainly looks like he has irritable bowel  syndrome at this time.  He has the positive [employer] serologic panel  for Crohn's and that sometimes people have Crohn's disease that does not  really manifest itself in the mucosa of the gut until later on in life  and in the interim, they act and are treated as if they have irritable  bowel syndrome.  He has had some repeated attacks of subxiphoid pain,  Dr. Jarold Motto had recommended a [facility] HIDA scan since previous ultrasound  was reported to be negative for stones, and I explained the rationale  for this study.  He will follow up with Dr. Matthias Hughs on this as well as  the possible small-bowel capsule endoscopy study, Dr. Jarold Motto had  mentioned that is a possibility as well.   Dr. Jarold Motto had also indicated that there may be gluten sensitivity  and had made a recommendation to continue with gluten-free diet, I think  there was a miscommunication in there in that the  patient had not been  on it, but we clarified that as well.     Iva Boop, MD,FACG  Electronically Signed    CEG/MedQ  DD: 07/30/2007  DT: 07/31/2007  Job #: [id number]   cc:   Vania Rea. Jarold Motto, MD, Caleen Essex, FAGA

## 2011-06-17 ENCOUNTER — Other Ambulatory Visit: Payer: Self-pay | Admitting: Internal Medicine

## 2011-07-30 ENCOUNTER — Ambulatory Visit (INDEPENDENT_AMBULATORY_CARE_PROVIDER_SITE_OTHER): Payer: BC Managed Care – PPO | Admitting: Internal Medicine

## 2011-07-30 ENCOUNTER — Encounter: Payer: Self-pay | Admitting: Internal Medicine

## 2011-07-30 DIAGNOSIS — I10 Essential (primary) hypertension: Secondary | ICD-10-CM

## 2011-07-30 DIAGNOSIS — J069 Acute upper respiratory infection, unspecified: Secondary | ICD-10-CM

## 2011-07-30 MED ORDER — HYDROCODONE-HOMATROPINE 5-1.5 MG/5ML PO SYRP: 5.0000 mL | ORAL_SOLUTION | Freq: Four times a day (QID) | ORAL | Status: AC | PRN

## 2011-07-30 NOTE — Progress Notes (Signed)
  Subjective:    Patient ID: Kyle [name], male    DOB: 02/27/1976, 35 y.o.   MRN: [id number]  HPI  35 year old patient who presents with a chief complaint of weakness cough low-grade fever chest congestion. Cough is largely nonproductive. His wife and 2 children have had similar illness he has felt poorly with fatigue weakness and has basically spent the weekend in bed. No recent fever or chills. He feels improved but still quite weak. He does have treated hypertension    Review of Systems  Constitutional: Positive for fever, appetite change and fatigue. Negative for chills.  HENT: Positive for congestion and rhinorrhea. Negative for hearing loss, ear pain, sore throat, trouble swallowing, neck stiffness, dental problem, voice change and tinnitus.   Eyes: Negative for pain, discharge and visual disturbance.  Respiratory: Positive for cough. Negative for chest tightness, wheezing and stridor.   Cardiovascular: Negative for chest pain, palpitations and leg swelling.  Gastrointestinal: Negative for nausea, vomiting, abdominal pain, diarrhea, constipation, blood in stool and abdominal distention.  Genitourinary: Negative for urgency, hematuria, flank pain, discharge, difficulty urinating and genital sores.  Musculoskeletal: Negative for myalgias, back pain, joint swelling, arthralgias and gait problem.  Skin: Negative for rash.  Neurological: Negative for dizziness, syncope, speech difficulty, weakness, numbness and headaches.  Hematological: Negative for adenopathy. Does not bruise/bleed easily.  Psychiatric/Behavioral: Negative for behavioral problems and dysphoric mood. The patient is not nervous/anxious.        Objective:   Physical Exam  Constitutional: He is oriented to person, place, and time. He appears well-developed and well-nourished. No distress.       Blood pressure 130/90  HENT:  Head: Normocephalic.  Right Ear: External ear normal.  Left Ear: External ear normal.  Eyes:  Conjunctivae and EOM are normal.  Neck: Normal range of motion. Thyromegaly present.  Cardiovascular: Normal rate and normal heart sounds.   Pulmonary/Chest: Effort normal and breath sounds normal. No respiratory distress. He has no wheezes. He has no rales.       Frequent paroxysms of coughing during the exam  Abdominal: Bowel sounds are normal.  Musculoskeletal: Normal range of motion. He exhibits no edema and no tenderness.  Neurological: He is alert and oriented to person, place, and time.  Psychiatric: He has a normal mood and affect. His behavior is normal.          Assessment & Plan:    Viral URI. Will treat symptomatically with Hydromet. Hypertension. Well controlled we'll continue his present regimen

## 2011-07-30 NOTE — Patient Instructions (Signed)
Get plenty of rest, Drink lots of  clear liquids, and use Tylenol or ibuprofen for fever and discomfort.    Call or return to clinic prn if these symptoms worsen or fail to improve as anticipated.  Please check your blood pressure on a regular basis.  If it is consistently greater than 150/90, please make an office appointment.

## 2011-08-06 LAB — COMPREHENSIVE METABOLIC PANEL
ALT: 30
AST: 22
AST: 22
Albumin: 3.8
Albumin: 4
Alkaline Phosphatase: 37 — ABNORMAL LOW
Alkaline Phosphatase: 42
BUN: 10
CO2: 30
Chloride: 102
Chloride: 106
Creatinine, Ser: 0.81
Creatinine, Ser: 0.87
GFR calc Af Amer: >60
Glucose, Bld: 94
Potassium: 4.2
Total Bilirubin: 0.6
Total Protein: 6.6
Total Protein: 6.9

## 2011-08-06 LAB — POCT CARDIAC MARKERS
Operator id: 265201
Troponin i, poc: <0.05

## 2011-08-06 LAB — CBC
HCT: 43.5
Hemoglobin: 14
MCHC: 35
MCV: 82.8
MCV: 83
RBC: 4.82
RDW: 12.5
WBC: 6.3

## 2011-08-06 LAB — CARDIAC PANEL(CRET KIN+CKTOT+MB+TROPI)
Relative Index: INVALID
Troponin I: 0.01

## 2011-08-06 LAB — I-STAT 8, (EC8 V) (CONVERTED LAB)
BUN: 8
Bicarbonate: 26.9 — ABNORMAL HIGH
Glucose, Bld: 116 — ABNORMAL HIGH
HCT: 45
Sodium: 141
TCO2: 28
pCO2, Ven: 42.3 — ABNORMAL LOW
pH, Ven: 7.411 — ABNORMAL HIGH

## 2011-08-06 LAB — CK TOTAL AND CKMB (NOT AT ARMC)
CK, MB: 0.9
Relative Index: INVALID
Total CK: 88

## 2011-08-06 LAB — PROTIME-INR: INR: 1.1

## 2011-08-06 LAB — DIFFERENTIAL
Eosinophils Absolute: 0.1
Eosinophils Relative: 2
Lymphocytes Relative: 31
Lymphs Abs: 1.9
Monocytes Absolute: 0.5
Neutrophils Relative %: 58

## 2011-08-06 LAB — POCT I-STAT CREATININE: Creatinine, Ser: 0.9

## 2011-08-06 LAB — TSH: TSH: 1.373

## 2011-08-06 LAB — LIPID PANEL: Total CHOL/HDL Ratio: 6.6

## 2011-08-06 LAB — APTT
aPTT: 34
aPTT: 36

## 2011-08-16 ENCOUNTER — Ambulatory Visit (INDEPENDENT_AMBULATORY_CARE_PROVIDER_SITE_OTHER): Payer: BC Managed Care – PPO | Admitting: Internal Medicine

## 2011-08-16 ENCOUNTER — Encounter: Payer: Self-pay | Admitting: Internal Medicine

## 2011-08-16 DIAGNOSIS — G4730 Sleep apnea, unspecified: Secondary | ICD-10-CM

## 2011-08-16 DIAGNOSIS — I10 Essential (primary) hypertension: Secondary | ICD-10-CM

## 2011-08-16 NOTE — Progress Notes (Signed)
  Subjective:    Patient ID: Kyle [name], male    DOB: 1976/10/03, 35 y.o.   MRN: [id number]  HPI  35 year old patient who has a history of sleep apnea. He has used a CPAP machine in the past but has not tolerated the mask well and really does not use his CPAP machine he has had a CPAP titration study and the best observed pressure that corrected his sleep disorder breathing was a pressure of 7 cm of water. At He has treated hypertension which has been well. He has exogenous obesity. Complaints include fatigue and daytime sleepiness    Review of Systems  Constitutional: Positive for fatigue.  Psychiatric/Behavioral: Positive for sleep disturbance.       Objective:   Physical Exam  Constitutional: He is oriented to person, place, and time. He appears well-developed and well-nourished. No distress.       Weight 280  HENT:  Head: Normocephalic.  Right Ear: External ear normal.  Left Ear: External ear normal.  Eyes: Conjunctivae and EOM are normal.       Mild pharyngeal crowding laterally  Neck: Normal range of motion.  Cardiovascular: Normal rate, regular rhythm and normal heart sounds.   Pulmonary/Chest: Breath sounds normal.  Abdominal: Bowel sounds are normal.  Musculoskeletal: Normal range of motion. He exhibits no edema and no tenderness.  Neurological: He is alert and oriented to person, place, and time.  Psychiatric: He has a normal mood and affect. His behavior is normal.          Assessment & Plan:   Hypertension well controlled. We'll continue present regimen Obstructive sleep apnea. We'll reset the patient with a new face mask and initiate CPAP at 7 cm of water    Low-salt diet exercise and especially weight loss all discussed and encouraged

## 2011-08-16 NOTE — Patient Instructions (Signed)
Limit your sodium (Salt) intake  You need to lose weight.  Consider a lower calorie diet and regular exercise.    It is important that you exercise regularly, at least 20 minutes 3 to 4 times per week.  If you develop chest pain or shortness of breath seek  medical attention.  Please check your blood pressure on a regular basis.  If it is consistently greater than 150/90, please make an office appointment.  Return in 6 months for follow-up  

## 2011-12-08 ENCOUNTER — Other Ambulatory Visit: Payer: Self-pay | Admitting: Internal Medicine

## 2011-12-24 ENCOUNTER — Other Ambulatory Visit: Payer: Self-pay | Admitting: Internal Medicine

## 2012-04-20 ENCOUNTER — Ambulatory Visit (INDEPENDENT_AMBULATORY_CARE_PROVIDER_SITE_OTHER): Payer: BC Managed Care – PPO | Admitting: Internal Medicine

## 2012-04-20 ENCOUNTER — Encounter: Payer: Self-pay | Admitting: Internal Medicine

## 2012-04-20 VITALS — BP 120/80 | Temp 98.0°F | Wt 288.0 lb

## 2012-04-20 DIAGNOSIS — I10 Essential (primary) hypertension: Secondary | ICD-10-CM

## 2012-04-20 DIAGNOSIS — J069 Acute upper respiratory infection, unspecified: Secondary | ICD-10-CM

## 2012-04-20 MED ORDER — HYDROCODONE-HOMATROPINE 5-1.5 MG/5ML PO SYRP: 5.0000 mL | ORAL_SOLUTION | Freq: Four times a day (QID) | ORAL | Status: AC | PRN

## 2012-04-20 NOTE — Progress Notes (Signed)
  Subjective:    Patient ID: Kyle [name], male    DOB: [birth year], 36 y.o.   MRN: [id number]  HPI  36 year old patient has a history of treated hypertension a remote history of tobacco use with discontinuation in 2005. He had the acute onset of illness 6 days ago with sore throat cervical lymphadenopathy and low-grade fever he was out of town on vacation and 2 days later due to clinical worsening [place]. A rapid strep was negative he was treated with a Z-Pak and has taken his final dose today. 2 days ago developed a nonproductive cough and worsening congestion. He still feels unwell with fatigue and hypersomnolence. Blood pressure was notably elevated at his UC  Encounter. Allergies include penicillin only    Review of Systems  Constitutional: Positive for fever, chills and fatigue. Negative for appetite change.  HENT: Positive for congestion and sore throat. Negative for hearing loss, ear pain, trouble swallowing, neck stiffness, dental problem, voice change and tinnitus.   Eyes: Negative for pain, discharge and visual disturbance.  Respiratory: Positive for cough. Negative for chest tightness, wheezing and stridor.   Cardiovascular: Negative for chest pain, palpitations and leg swelling.  Gastrointestinal: Negative for nausea, vomiting, abdominal pain, diarrhea, constipation, blood in stool and abdominal distention.  Genitourinary: Negative for urgency, hematuria, flank pain, discharge, difficulty urinating and genital sores.  Musculoskeletal: Negative for myalgias, back pain, joint swelling, arthralgias and gait problem.  Skin: Negative for rash.  Neurological: Negative for dizziness, syncope, speech difficulty, weakness, numbness and headaches.  Hematological: Negative for adenopathy. Does not bruise/bleed easily.  Psychiatric/Behavioral: Negative for behavioral problems and dysphoric mood. The patient is not nervous/anxious.        Objective:   Physical Exam  Constitutional: He is oriented to person, place, and time. He appears well-developed.  HENT:  Head: Normocephalic.  Right Ear: External ear normal.  Left Ear: External ear normal.       Mild erythema the oropharynx No cervical adenopathy  Eyes: Conjunctivae and EOM are normal.  Neck: Normal range of motion.  Cardiovascular: Normal rate and normal heart sounds.   Pulmonary/Chest: Effort normal and breath sounds normal. No respiratory distress. He has no wheezes. He has no rales.  Abdominal: Bowel sounds are normal.  Musculoskeletal: Normal range of motion. He exhibits no edema and no tenderness.  Neurological: He is alert and oriented to person, place, and time.  Psychiatric: He has a normal mood and affect. His behavior is normal.          Assessment & Plan:   URI with cough. The patient has completed his final dose of azithromycin. Will treat his cough aggressively with Hydromet. Hypertension presently controlled. Samples dispensed

## 2012-04-20 NOTE — Patient Instructions (Addendum)
Get plenty of rest, Drink lots of  clear liquids, and use Tylenol  for fever and discomfort.    Cough medicine as directed  Okay to use indomethacin

## 2012-10-08 ENCOUNTER — Ambulatory Visit (INDEPENDENT_AMBULATORY_CARE_PROVIDER_SITE_OTHER): Payer: BC Managed Care – PPO | Admitting: Internal Medicine

## 2012-10-08 ENCOUNTER — Encounter: Payer: Self-pay | Admitting: Internal Medicine

## 2012-10-08 VITALS — BP 160/100 | HR 88 | Temp 97.9°F | Resp 20 | Ht 73.75 in | Wt 295.0 lb

## 2012-10-08 DIAGNOSIS — Z23 Encounter for immunization: Secondary | ICD-10-CM

## 2012-10-08 DIAGNOSIS — M109 Gout, unspecified: Secondary | ICD-10-CM

## 2012-10-08 DIAGNOSIS — Z0000 Encounter for general adult medical examination without abnormal findings: Secondary | ICD-10-CM

## 2012-10-08 DIAGNOSIS — E079 Disorder of thyroid, unspecified: Secondary | ICD-10-CM

## 2012-10-08 DIAGNOSIS — I10 Essential (primary) hypertension: Secondary | ICD-10-CM

## 2012-10-08 DIAGNOSIS — G4730 Sleep apnea, unspecified: Secondary | ICD-10-CM

## 2012-10-08 LAB — COMPREHENSIVE METABOLIC PANEL
ALT: 97 U/L — ABNORMAL HIGH (ref 0–53)
AST: 63 U/L — ABNORMAL HIGH (ref 0–37)
Creatinine, Ser: 0.8 mg/dL (ref 0.4–1.5)
Sodium: 137 mEq/L (ref 135–145)
Total Bilirubin: 0.9 mg/dL (ref 0.3–1.2)
Total Protein: 8 g/dL (ref 6.0–8.3)

## 2012-10-08 LAB — TSH: TSH: 1.83 u[IU]/mL (ref 0.35–5.50)

## 2012-10-08 LAB — URIC ACID: Uric Acid, Serum: 7 mg/dL (ref 4.0–7.8)

## 2012-10-08 MED ORDER — ALLOPURINOL 300 MG PO TABS: 300.0000 mg | ORAL_TABLET | Freq: Every day | ORAL | Status: DC

## 2012-10-08 MED ORDER — LOSARTAN POTASSIUM-HCTZ 100-25 MG PO TABS: 1.0000 | ORAL_TABLET | Freq: Every day | ORAL | Status: DC

## 2012-10-08 NOTE — Progress Notes (Signed)
Subjective:    Patient ID: Kyle [name], male    DOB: [birth year], 36 y.o.   MRN: [id number]  HPI  36 year old patient who is seen today for a wellness exam. Medical problems include hypertension. He has not been taking his blood pressure medication and blood pressure mildly elevated today. Since his last visit he has gained 15 pounds in weight. He has a history of hyperuricemia and gout. He has OSA and is treated with CPAP. He has a family history of hypothyroidism and a personal history of TSH abnormality in the past when last checked this was normal  Family history father age 46 status post CABG history of prostate cancer and hypothyroidism Mother age 42 prior MI history of hypertension 2 half sisters in good health one with a history of cervical cancer  Social history married 2 children ages 41 and 97  Wt Readings from Last 3 Encounters:  10/08/12 295 lb (133.811 kg)  04/20/12 288 lb (130.636 kg)  08/16/11 280 lb (127.007 kg)    BP Readings from Last 3 Encounters:  10/08/12 160/100  04/20/12 120/80  08/16/11 130/70    Review of Systems  Constitutional: Positive for fatigue and unexpected weight change. Negative for fever, chills, activity change and appetite change.  HENT: Negative for hearing loss, ear pain, congestion, rhinorrhea, sneezing, mouth sores, trouble swallowing, neck pain, neck stiffness, dental problem, voice change, sinus pressure and tinnitus.   Eyes: Negative for photophobia, pain, redness and visual disturbance.  Respiratory: Negative for apnea, cough, choking, chest tightness, shortness of breath and wheezing.   Cardiovascular: Negative for chest pain, palpitations and leg swelling.  Gastrointestinal: Negative for nausea, vomiting, abdominal pain, diarrhea, constipation, blood in stool, abdominal distention, anal bleeding and rectal pain.  Genitourinary: Negative for dysuria, urgency, frequency, hematuria, flank pain, decreased urine volume, discharge, penile  swelling, scrotal swelling, difficulty urinating, genital sores and testicular pain.  Musculoskeletal: Negative for myalgias, back pain, joint swelling, arthralgias and gait problem.  Skin: Negative for color change, rash and wound.  Neurological: Negative for dizziness, tremors, seizures, syncope, facial asymmetry, speech difficulty, weakness, light-headedness, numbness and headaches.  Hematological: Negative for adenopathy. Does not bruise/bleed easily.  Psychiatric/Behavioral: Negative for suicidal ideas, hallucinations, behavioral problems, confusion, sleep disturbance, self-injury, dysphoric mood, decreased concentration and agitation. The patient is not nervous/anxious.        Objective:   Physical Exam  Constitutional: He appears well-developed and well-nourished.       Blood pressure 150-160/98 -100  HENT:  Head: Normocephalic and atraumatic.  Right Ear: External ear normal.  Left Ear: External ear normal.  Nose: Nose normal.  Mouth/Throat: Oropharynx is clear and moist.  Eyes: Conjunctivae normal and EOM are normal. Pupils are equal, round, and reactive to light. No scleral icterus.  Neck: Normal range of motion. Neck supple. No JVD present. No thyromegaly present.  Cardiovascular: Regular rhythm, normal heart sounds and intact distal pulses.  Exam reveals no gallop and no friction rub.   No murmur heard. Pulmonary/Chest: Effort normal and breath sounds normal. He exhibits no tenderness.  Abdominal: Soft. Bowel sounds are normal. He exhibits no distension and no mass. There is no tenderness.  Genitourinary: Prostate normal and penis normal.  Musculoskeletal: Normal range of motion. He exhibits no edema and no tenderness.  Lymphadenopathy:    He has no cervical adenopathy.  Neurological: He is alert. He has normal reflexes. No cranial nerve deficit. Coordination normal.  Skin: Skin is warm and dry. No rash noted.  Psychiatric: He  has a normal mood and affect. His behavior is  normal.          Assessment & Plan:  Preventive health exam Hypertension poor control. We'll resume antihypertensive therapy Exogenous obesity weight loss encouraged OSA. Continue CPAP weight loss encouraged History of gout stable

## 2012-10-08 NOTE — Patient Instructions (Signed)
Limit your sodium (Salt) intake  You need to lose weight.  Consider a lower calorie diet and regular exercise.    It is important that you exercise regularly, at least 20 minutes 3 to 4 times per week.  If you develop chest pain or shortness of breath seek  medical attention.  Return in 3 months for follow-up

## 2013-01-06 ENCOUNTER — Ambulatory Visit: Payer: BC Managed Care – PPO | Admitting: Internal Medicine

## 2013-03-01 ENCOUNTER — Encounter: Payer: Self-pay | Admitting: Internal Medicine

## 2013-03-01 ENCOUNTER — Ambulatory Visit (INDEPENDENT_AMBULATORY_CARE_PROVIDER_SITE_OTHER): Payer: PRIVATE HEALTH INSURANCE | Admitting: Internal Medicine

## 2013-03-01 VITALS — BP 160/90 | HR 108 | Temp 98.5°F | Resp 20 | Wt 296.0 lb

## 2013-03-01 DIAGNOSIS — I10 Essential (primary) hypertension: Secondary | ICD-10-CM

## 2013-03-01 DIAGNOSIS — G4730 Sleep apnea, unspecified: Secondary | ICD-10-CM

## 2013-03-01 MED ORDER — FLUTICASONE PROPIONATE 50 MCG/ACT NA SUSP: 2.0000 | Freq: Every day | NASAL | Status: DC

## 2013-03-01 MED ORDER — AMLODIPINE BESYLATE 5 MG PO TABS: 5.0000 mg | ORAL_TABLET | Freq: Every day | ORAL | Status: DC

## 2013-03-01 NOTE — Patient Instructions (Signed)
Limit your sodium (Salt) intake    It is important that you exercise regularly, at least 20 minutes 3 to 4 times per week.  If you develop chest pain or shortness of breath seek  medical attention.  Please check your blood pressure on a regular basis.  If it is consistently greater than 150/90, please make an office appointment.  You need to lose weight.  Consider a lower calorie diet and regular exercise.  Return in 3 months for follow-up

## 2013-03-01 NOTE — Progress Notes (Signed)
Subjective:    Patient ID: Kyle [name], male    DOB: 02-03-1976, 37 y.o.   MRN: [id number]  HPI  37 year old patient who is seen today for followup. He has history of treated hypertension which was not optimally controlled 4 months ago. He has OSA and obesity. His main complaint is allergy related. His weight remains at 296.  Past Medical History  Diagnosis Date  . Essential hypertension, benign 09/08/2008  . GERD 05/11/2007  . Gout, unspecified 09/05/2009  . HYPERTENSION 01/08/2008  . IBS 01/08/2008  . PLANT DERMATITIS 09/08/2008  . SLEEP APNEA 01/08/2008  . THYROID STIMULATING HORMONE, ABNORMAL 10/10/2008    History   Social History  . Marital Status: Married    Spouse Name: N/A    Number of Children: N/A  . Years of Education: N/A   Occupational History  . Not on file.   Social History Main Topics  . Smoking status: Former Smoker    Quit date: 10/29/2003  . Smokeless tobacco: Never Used  . Alcohol Use: Yes  . Drug Use: No  . Sexually Active: Not on file   Other Topics Concern  . Not on file   Social History Narrative  . No narrative on file    Past Surgical History  Procedure Laterality Date  . Hernia repair      Family History  Problem Relation Age of Onset  . Stroke Neg Hx     family hx  . Hypertension Neg Hx     family hx  . Cancer Neg Hx     cervical, prostate - family hx  . Heart disease Neg Hx     family hx  . Hyperlipidemia Neg Hx     family hx    Allergies  Allergen Reactions  . Penicillins     Current Outpatient Prescriptions on File Prior to Visit  Medication Sig Dispense Refill  . allopurinol (ZYLOPRIM) 300 MG tablet Take 1 tablet (300 mg total) by mouth daily.  90 tablet  2  . indomethacin (INDOCIN) 50 MG capsule TAKE AS NEEDED  50 capsule  1  . losartan-hydrochlorothiazide (HYZAAR) 100-25 MG per tablet Take 1 tablet by mouth daily.  90 tablet  3   No current facility-administered medications on file prior to visit.    BP 160/90   Pulse 108  Temp(Src) 98.5 F (36.9 C) (Oral)  Resp 20  Wt 296 lb (134.265 kg)  BMI 38.27 kg/m2  SpO2 98%       Review of Systems  Constitutional: Negative for fever, chills, appetite change and fatigue.  HENT: Positive for congestion, rhinorrhea, sneezing and postnasal drip. Negative for hearing loss, ear pain, sore throat, trouble swallowing, neck stiffness, dental problem, voice change and tinnitus.   Eyes: Positive for itching. Negative for pain, discharge and visual disturbance.  Respiratory: Negative for cough, chest tightness, wheezing and stridor.   Cardiovascular: Negative for chest pain, palpitations and leg swelling.  Gastrointestinal: Negative for nausea, vomiting, abdominal pain, diarrhea, constipation, blood in stool and abdominal distention.  Genitourinary: Negative for urgency, hematuria, flank pain, discharge, difficulty urinating and genital sores.  Musculoskeletal: Negative for myalgias, back pain, joint swelling, arthralgias and gait problem.  Skin: Negative for rash.  Neurological: Negative for dizziness, syncope, speech difficulty, weakness, numbness and headaches.  Hematological: Negative for adenopathy. Does not bruise/bleed easily.  Psychiatric/Behavioral: Negative for behavioral problems and dysphoric mood. The patient is not nervous/anxious.        Objective:   Physical Exam  Constitutional:  He is oriented to person, place, and time. He appears well-developed.  Blood pressure 160/96  HENT:  Head: Normocephalic.  Right Ear: External ear normal.  Left Ear: External ear normal.  Eyes: Conjunctivae and EOM are normal.  Neck: Normal range of motion.  Cardiovascular: Normal rate and normal heart sounds.   Pulmonary/Chest: Breath sounds normal.  Abdominal: Bowel sounds are normal.  Musculoskeletal: Normal range of motion. He exhibits no edema and no tenderness.  Neurological: He is alert and oriented to person, place, and time.  Psychiatric: He has a  normal mood and affect. His behavior is normal.          Assessment & Plan:   Hypertension. Suboptimal control. Lifestyle issues addressed. We'll add amlodipine 5 Allergic rhinitis. Will add a nasal steroid Obesity OSA. Weight loss encouraged  Recheck 3 months

## 2013-03-01 NOTE — Progress Notes (Signed)
  Subjective:    Patient ID: Kyle [name], male    DOB: 06-Mar-1976, 37 y.o.   MRN: [id number]  HPI  BP Readings from Last 3 Encounters:  03/01/13 160/90  10/08/12 160/100  04/20/12 120/80    Wt Readings from Last 3 Encounters:  03/01/13 296 lb (134.265 kg)  10/08/12 295 lb (133.811 kg)  04/20/12 288 lb (130.636 kg)    Review of Systems     Objective:   Physical Exam        Assessment & Plan:

## 2013-06-01 ENCOUNTER — Ambulatory Visit: Payer: PRIVATE HEALTH INSURANCE | Admitting: Internal Medicine

## 2013-06-02 ENCOUNTER — Ambulatory Visit (INDEPENDENT_AMBULATORY_CARE_PROVIDER_SITE_OTHER): Payer: PRIVATE HEALTH INSURANCE | Admitting: Internal Medicine

## 2013-06-02 ENCOUNTER — Encounter: Payer: Self-pay | Admitting: Internal Medicine

## 2013-06-02 VITALS — BP 150/90 | HR 95 | Temp 99.0°F | Resp 20 | Wt 298.0 lb

## 2013-06-02 DIAGNOSIS — I10 Essential (primary) hypertension: Secondary | ICD-10-CM

## 2013-06-02 DIAGNOSIS — G4730 Sleep apnea, unspecified: Secondary | ICD-10-CM

## 2013-06-02 DIAGNOSIS — E669 Obesity, unspecified: Secondary | ICD-10-CM | POA: Insufficient documentation

## 2013-06-02 NOTE — Patient Instructions (Addendum)
Limit your sodium (Salt) intake  Please check your blood pressure on a regular basis.  If it is consistently greater than 150/90, please make an office appointment.  Return in 6 months for follow-up DASH Diet The DASH diet stands for "Dietary Approaches to Stop Hypertension." It is a healthy eating plan that has been shown to reduce high blood pressure (hypertension) in as little as 14 days, while also possibly providing other significant health benefits. These other health benefits include reducing the risk of breast cancer after menopause and reducing the risk of type 2 diabetes, heart disease, colon cancer, and stroke. Health benefits also include weight loss and slowing kidney failure in patients with chronic kidney disease.  DIET GUIDELINES  Limit salt (sodium). Your diet should contain less than 1500 mg of sodium daily.  Limit refined or processed carbohydrates. Your diet should include mostly whole grains. Desserts and added sugars should be used sparingly.  Include small amounts of heart-healthy fats. These types of fats include nuts, oils, and tub margarine. Limit saturated and trans fats. These fats have been shown to be harmful in the body. CHOOSING FOODS  The following food groups are based on a 2000 calorie diet. See your Registered Dietitian for individual calorie needs. Grains and Grain Products (6 to 8 servings daily)  Eat More Often: Whole-wheat bread, brown rice, whole-grain or wheat pasta, quinoa, popcorn without added fat or salt (air popped).  Eat Less Often: White bread, white pasta, white rice, cornbread. Vegetables (4 to 5 servings daily)  Eat More Often: Fresh, frozen, and canned vegetables. Vegetables may be raw, steamed, roasted, or grilled with a minimal amount of fat.  Eat Less Often/Avoid: Creamed or fried vegetables. Vegetables in a cheese sauce. Fruit (4 to 5 servings daily)  Eat More Often: All fresh, canned (in natural juice), or frozen fruits. Dried  fruits without added sugar. One hundred percent fruit juice ( cup [237 mL] daily).  Eat Less Often: Dried fruits with added sugar. Canned fruit in light or heavy syrup. [employer], Fish, and Poultry (2 servings or less daily. One serving is 3 to 4 oz [85-114 g]).  Eat More Often: Ninety percent or leaner ground beef, tenderloin, sirloin. Round cuts of beef, chicken breast, [place] breast. All fish. Grill, bake, or broil your meat. Nothing should be fried.  Eat Less Often/Avoid: Fatty cuts of meat, [place], or chicken leg, thigh, or wing. Fried cuts of meat or fish. Dairy (2 to 3 servings)  Eat More Often: Low-fat or fat-free milk, low-fat plain or light yogurt, reduced-fat or part-skim cheese.  Eat Less Often/Avoid: Milk (whole, 2%).Whole milk yogurt. Full-fat cheeses. Nuts, Seeds, and Legumes (4 to 5 servings per week)  Eat More Often: All without added salt.  Eat Less Often/Avoid: Salted nuts and seeds, canned beans with added salt. Fats and Sweets (limited)  Eat More Often: Vegetable oils, tub margarines without trans fats, sugar-free gelatin. Mayonnaise and salad dressings.  Eat Less Often/Avoid: Coconut oils, palm oils, butter, stick margarine, cream, half and half, cookies, candy, pie. FOR MORE INFORMATION The Dash Diet Eating Plan: www.dashdiet.org Document Released: 10/03/2011 Document Revised: 01/06/2012 Document Reviewed: 10/03/2011 [facility] Patient Information 2014 [place], [place].

## 2013-06-02 NOTE — Progress Notes (Signed)
  Subjective:    Patient ID: Kyle [name], male    DOB: 14-Nov-1975, 37 y.o.   MRN: [id number]  HPI  BP Readings from Last 3 Encounters:  06/02/13 150/90  03/01/13 160/90  10/08/12 160/100    Wt Readings from Last 3 Encounters:  06/02/13 298 lb (135.172 kg)  03/01/13 296 lb (134.265 kg)  10/08/12 295 lb (133.811 kg)    Review of Systems     Objective:   Physical Exam        Assessment & Plan:

## 2013-06-02 NOTE — Progress Notes (Signed)
Subjective:    Patient ID: Kyle [name], male    DOB: 1976/03/25, 37 y.o.   MRN: [id number]  HPI 37 year old patient who is seen today for followup of hypertension. He feels well. No concerns or complaints. He has obesity and OSA. No weight loss. Rare home blood pressure monitoring but usually in the 140/90 range.  Past Medical History  Diagnosis Date  . Essential hypertension, benign 09/08/2008  . GERD 05/11/2007  . Gout, unspecified 09/05/2009  . HYPERTENSION 01/08/2008  . IBS 01/08/2008  . PLANT DERMATITIS 09/08/2008  . SLEEP APNEA 01/08/2008  . THYROID STIMULATING HORMONE, ABNORMAL 10/10/2008    History   Social History  . Marital Status: Married    Spouse Name: N/A    Number of Children: N/A  . Years of Education: N/A   Occupational History  . Not on file.   Social History Main Topics  . Smoking status: Former Smoker    Quit date: 10/29/2003  . Smokeless tobacco: Never Used  . Alcohol Use: Yes  . Drug Use: No  . Sexually Active: Not on file   Other Topics Concern  . Not on file   Social History Narrative  . No narrative on file    Past Surgical History  Procedure Laterality Date  . Hernia repair      Family History  Problem Relation Age of Onset  . Stroke Neg Hx     family hx  . Hypertension Neg Hx     family hx  . Cancer Neg Hx     cervical, prostate - family hx  . Heart disease Neg Hx     family hx  . Hyperlipidemia Neg Hx     family hx    Allergies  Allergen Reactions  . Penicillins     Current Outpatient Prescriptions on File Prior to Visit  Medication Sig Dispense Refill  . allopurinol (ZYLOPRIM) 300 MG tablet Take 1 tablet (300 mg total) by mouth daily.  90 tablet  2  . amLODipine (NORVASC) 5 MG tablet Take 1 tablet (5 mg total) by mouth daily.  90 tablet  3  . fluticasone (FLONASE) 50 MCG/ACT nasal spray Place 2 sprays into the nose daily.  16 g  6  . losartan-hydrochlorothiazide (HYZAAR) 100-25 MG per tablet Take 1 tablet by mouth  daily.  90 tablet  3  . indomethacin (INDOCIN) 50 MG capsule TAKE AS NEEDED  50 capsule  1   No current facility-administered medications on file prior to visit.    BP 150/90  Pulse 95  Temp(Src) 99 F (37.2 C) (Oral)  Resp 20  Wt 298 lb (135.172 kg)  BMI 38.53 kg/m2  SpO2 98%       Review of Systems  Constitutional: Negative for fever, chills, appetite change and fatigue.  HENT: Negative for hearing loss, ear pain, congestion, sore throat, trouble swallowing, neck stiffness, dental problem, voice change and tinnitus.   Eyes: Negative for pain, discharge and visual disturbance.  Respiratory: Negative for cough, chest tightness, wheezing and stridor.   Cardiovascular: Negative for chest pain, palpitations and leg swelling.  Gastrointestinal: Negative for nausea, vomiting, abdominal pain, diarrhea, constipation, blood in stool and abdominal distention.  Genitourinary: Negative for urgency, hematuria, flank pain, discharge, difficulty urinating and genital sores.  Musculoskeletal: Negative for myalgias, back pain, joint swelling, arthralgias and gait problem.  Skin: Negative for rash.  Neurological: Negative for dizziness, syncope, speech difficulty, weakness, numbness and headaches.  Hematological: Negative for adenopathy. Does not bruise/bleed easily.  Psychiatric/Behavioral: Negative for behavioral problems and dysphoric mood. The patient is not nervous/anxious.        Objective:   Physical Exam  Constitutional: He is oriented to person, place, and time. He appears well-developed.  Blood pressure 140/90 in both arms  HENT:  Head: Normocephalic.  Right Ear: External ear normal.  Left Ear: External ear normal.  Eyes: Conjunctivae and EOM are normal.  Neck: Normal range of motion.  Cardiovascular: Normal rate and normal heart sounds.   Pulmonary/Chest: Breath sounds normal.  Abdominal: Bowel sounds are normal.  Musculoskeletal: Normal range of motion. He exhibits no  edema and no tenderness.  Neurological: He is alert and oriented to person, place, and time.  Psychiatric: He has a normal mood and affect. His behavior is normal.          Assessment & Plan:   Hypertension. Reasonable control. Nonpharmacologic measures discussed Exogenous obesity. Weight loss exercise encouraged  No change in medications Recheck 6 months

## 2013-08-25 ENCOUNTER — Telehealth: Payer: Self-pay | Admitting: Internal Medicine

## 2013-08-25 NOTE — Telephone Encounter (Signed)
Pt is having symptoms of high blood sugar. Pt is overweight , extreme thirst, frequent urination, blurred vision. Pt would like to have a1c labs done prior to seeing you. Pt would like lab appt would also make ov to determine results. pls advise.

## 2013-08-26 NOTE — Telephone Encounter (Signed)
Noted  

## 2013-08-26 NOTE — Telephone Encounter (Signed)
Suggest office visits today with random blood sugar

## 2013-08-26 NOTE — Telephone Encounter (Signed)
Lmom for pt to call me directly/ pt states on voice mail he's in meetings most of day.

## 2013-08-26 NOTE — Telephone Encounter (Signed)
Please call pt and add to schedule today per Dr.K

## 2013-08-26 NOTE — Telephone Encounter (Signed)
Pt out of town today. appt sch Friday for 3:45pm

## 2013-08-27 ENCOUNTER — Ambulatory Visit (INDEPENDENT_AMBULATORY_CARE_PROVIDER_SITE_OTHER): Payer: PRIVATE HEALTH INSURANCE | Admitting: Internal Medicine

## 2013-08-27 ENCOUNTER — Encounter: Payer: Self-pay | Admitting: Internal Medicine

## 2013-08-27 VITALS — BP 120/80 | Temp 97.7°F | Wt 284.0 lb

## 2013-08-27 DIAGNOSIS — E1169 Type 2 diabetes mellitus with other specified complication: Secondary | ICD-10-CM | POA: Insufficient documentation

## 2013-08-27 DIAGNOSIS — I10 Essential (primary) hypertension: Secondary | ICD-10-CM

## 2013-08-27 DIAGNOSIS — E1165 Type 2 diabetes mellitus with hyperglycemia: Secondary | ICD-10-CM

## 2013-08-27 DIAGNOSIS — R739 Hyperglycemia, unspecified: Secondary | ICD-10-CM

## 2013-08-27 DIAGNOSIS — R3589 Other polyuria: Secondary | ICD-10-CM

## 2013-08-27 DIAGNOSIS — IMO0002 Reserved for concepts with insufficient information to code with codable children: Secondary | ICD-10-CM

## 2013-08-27 DIAGNOSIS — R631 Polydipsia: Secondary | ICD-10-CM

## 2013-08-27 DIAGNOSIS — R7309 Other abnormal glucose: Secondary | ICD-10-CM

## 2013-08-27 DIAGNOSIS — Z23 Encounter for immunization: Secondary | ICD-10-CM

## 2013-08-27 DIAGNOSIS — R358 Other polyuria: Secondary | ICD-10-CM

## 2013-08-27 LAB — GLUCOSE, POCT (MANUAL RESULT ENTRY): POC Glucose: 403 mg/dL — AB (ref 70–99)

## 2013-08-27 MED ORDER — LOSARTAN POTASSIUM 100 MG PO TABS: 100.0000 mg | ORAL_TABLET | Freq: Every day | ORAL | Status: DC

## 2013-08-27 MED ORDER — METFORMIN HCL ER (MOD) 500 MG PO TB24: 500.0000 mg | ORAL_TABLET | Freq: Every day | ORAL | Status: DC

## 2013-08-27 MED ORDER — CANAGLIFLOZIN 300 MG PO TABS: 1.0000 | ORAL_TABLET | Freq: Every day | ORAL | Status: DC

## 2013-08-27 MED ORDER — INSULIN ASPART 100 UNIT/ML FLEXPEN: PEN_INJECTOR | SUBCUTANEOUS | Status: DC

## 2013-08-27 MED ORDER — INSULIN DETEMIR 100 UNIT/ML ~~LOC~~ SOLN: 20.0000 [IU] | Freq: Every day | SUBCUTANEOUS | Status: DC

## 2013-08-27 NOTE — Patient Instructions (Signed)
Drink as much fluid as you  can tolerate over the next few days  Return in one week for follow up

## 2013-08-27 NOTE — Progress Notes (Signed)
Subjective:    Patient ID: Kyle [name], male    DOB: 1976-10-25, 37 y.o.   MRN: [id number]  HPI  37 year old patient who has a history of exogenous obesity and treated hypertension. He presents with a 3 to four-week history of significant hyperglycemic symptoms. No family history of diabetes.  Wt Readings from Last 3 Encounters:  08/27/13 284 lb (128.822 kg)  06/02/13 298 lb (135.172 kg)  03/01/13 296 lb (134.265 kg)    Past Medical History  Diagnosis Date  . Essential hypertension, benign 09/08/2008  . GERD 05/11/2007  . Gout, unspecified 09/05/2009  . HYPERTENSION 01/08/2008  . IBS 01/08/2008  . PLANT DERMATITIS 09/08/2008  . SLEEP APNEA 01/08/2008  . THYROID STIMULATING HORMONE, ABNORMAL 10/10/2008    History   Social History  . Marital Status: Married    Spouse Name: N/A    Number of Children: N/A  . Years of Education: N/A   Occupational History  . Not on file.   Social History Main Topics  . Smoking status: Former Smoker    Quit date: 10/29/2003  . Smokeless tobacco: Never Used  . Alcohol Use: Yes  . Drug Use: No  . Sexual Activity: Not on file   Other Topics Concern  . Not on file   Social History Narrative  . No narrative on file    Past Surgical History  Procedure Laterality Date  . Hernia repair      Family History  Problem Relation Age of Onset  . Stroke Neg Hx     family hx  . Hypertension Neg Hx     family hx  . Cancer Neg Hx     cervical, prostate - family hx  . Heart disease Neg Hx     family hx  . Hyperlipidemia Neg Hx     family hx    Allergies  Allergen Reactions  . Penicillins     Current Outpatient Prescriptions on File Prior to Visit  Medication Sig Dispense Refill  . allopurinol (ZYLOPRIM) 300 MG tablet Take 1 tablet (300 mg total) by mouth daily.  90 tablet  2  . amLODipine (NORVASC) 5 MG tablet Take 1 tablet (5 mg total) by mouth daily.  90 tablet  3  . fluticasone (FLONASE) 50 MCG/ACT nasal spray Place 2 sprays into  the nose daily.  16 g  6  . indomethacin (INDOCIN) 50 MG capsule TAKE AS NEEDED  50 capsule  1   No current facility-administered medications on file prior to visit.    BP 120/80  Temp(Src) 97.7 F (36.5 C) (Oral)  Wt 284 lb (128.822 kg)  BMI 36.72 kg/m2       Review of Systems  Constitutional: Positive for activity change and fatigue. Negative for fever, chills and appetite change.  HENT: Negative for congestion, dental problem, ear pain, hearing loss, mouth sores, rhinorrhea, sinus pressure, sneezing, tinnitus, trouble swallowing and voice change.   Eyes: Positive for visual disturbance. Negative for photophobia, pain and redness.  Respiratory: Negative for apnea, cough, choking, chest tightness, shortness of breath and wheezing.   Cardiovascular: Negative for chest pain, palpitations and leg swelling.  Gastrointestinal: Negative for nausea, vomiting, abdominal pain, diarrhea, constipation, blood in stool, abdominal distention, anal bleeding and rectal pain.  Endocrine: Positive for polydipsia and polyuria.  Genitourinary: Positive for frequency. Negative for dysuria, urgency, hematuria, flank pain, decreased urine volume, discharge, penile swelling, scrotal swelling, difficulty urinating, genital sores and testicular pain.  Musculoskeletal: Negative for arthralgias, back pain, gait  problem, joint swelling, myalgias, neck pain and neck stiffness.  Skin: Negative for color change, rash and wound.  Neurological: Negative for dizziness, tremors, seizures, syncope, facial asymmetry, speech difficulty, weakness, light-headedness, numbness and headaches.  Hematological: Negative for adenopathy. Does not bruise/bleed easily.  Psychiatric/Behavioral: Negative for suicidal ideas, hallucinations, behavioral problems, confusion, sleep disturbance, self-injury, dysphoric mood, decreased concentration and agitation. The patient is not nervous/anxious.        Objective:   Physical Exam   Constitutional: He appears well-developed and well-nourished. No distress.  Blood pressure low normal  Cardiovascular: Regular rhythm.   No  tachycardia  Pulmonary/Chest: No respiratory distress.  No Kussmaul's respirations  Psychiatric: He has a normal mood and affect. His behavior is normal. Judgment and thought content normal.          Assessment & Plan:   New onset uncontrolled diabetes.  The patient was given extensive literature concerning diabetes and its management. He was counseled on the use of home glucose monitoring as well as insulin injection technique. He will be started on basal bolus insulin regimen and will be seen in 7 days. He was also started on oral medications. He will force fluids. Laboratory studies will be reviewed.  We'll refer for nutritional counseling We checked 7 days or as needed  Hypertension well controlled Exogenous obesity

## 2013-08-30 ENCOUNTER — Other Ambulatory Visit: Payer: Self-pay | Admitting: *Deleted

## 2013-08-30 MED ORDER — GLUCOSE BLOOD VI STRP: 1.0000 | ORAL_STRIP | Freq: Three times a day (TID) | Status: DC | PRN

## 2013-08-30 MED ORDER — FREESTYLE LANCETS MISC: 1.0000 | Freq: Three times a day (TID) | Status: DC | PRN

## 2013-09-03 ENCOUNTER — Ambulatory Visit (INDEPENDENT_AMBULATORY_CARE_PROVIDER_SITE_OTHER): Payer: PRIVATE HEALTH INSURANCE | Admitting: Internal Medicine

## 2013-09-03 ENCOUNTER — Encounter: Payer: Self-pay | Admitting: Internal Medicine

## 2013-09-03 VITALS — BP 140/90 | HR 89 | Temp 98.0°F | Resp 20 | Wt 285.0 lb

## 2013-09-03 DIAGNOSIS — IMO0002 Reserved for concepts with insufficient information to code with codable children: Secondary | ICD-10-CM

## 2013-09-03 DIAGNOSIS — E1165 Type 2 diabetes mellitus with hyperglycemia: Secondary | ICD-10-CM

## 2013-09-03 DIAGNOSIS — I10 Essential (primary) hypertension: Secondary | ICD-10-CM

## 2013-09-03 MED ORDER — INSULIN DETEMIR 100 UNIT/ML ~~LOC~~ SOLN: SUBCUTANEOUS | Status: DC

## 2013-09-03 MED ORDER — METFORMIN HCL ER (MOD) 500 MG PO TB24: 1000.0000 mg | ORAL_TABLET | Freq: Every day | ORAL | Status: DC

## 2013-09-03 NOTE — Progress Notes (Signed)
Subjective:    Patient ID: Kyle [name], male    DOB: 05/19/1976, 37 y.o.   MRN: [id number]  HPI  37 year old patient who is seen today in followup. He was seen 7 days ago with new onset uncontrolled diabetes. His hyperglycemic symptoms have all resolved except for some persistent blurred vision. He is on basal bolus insulin as well as metformin 500 mg once daily. He initially had some nausea on metformin therapy but none for the past 3 or 4 days. He is on mealtime insulin 8 units with 4 extra units if blood sugars are over 200. For the past 3 or 4 days he has had the nice glycemic control. Blood sugars are generally in the 130s to 160 range. Blood sugars throughout the day ranged from 1:30 to 200. No hypoglycemia.  Past Medical History  Diagnosis Date  . Essential hypertension, benign 09/08/2008  . GERD 05/11/2007  . Gout, unspecified 09/05/2009  . HYPERTENSION 01/08/2008  . IBS 01/08/2008  . PLANT DERMATITIS 09/08/2008  . SLEEP APNEA 01/08/2008  . THYROID STIMULATING HORMONE, ABNORMAL 10/10/2008    History   Social History  . Marital Status: Married    Spouse Name: N/A    Number of Children: N/A  . Years of Education: N/A   Occupational History  . Not on file.   Social History Main Topics  . Smoking status: Former Smoker    Quit date: 10/29/2003  . Smokeless tobacco: Never Used  . Alcohol Use: Yes  . Drug Use: No  . Sexual Activity: Not on file   Other Topics Concern  . Not on file   Social History Narrative  . No narrative on file    Past Surgical History  Procedure Laterality Date  . Hernia repair      Family History  Problem Relation Age of Onset  . Stroke Neg Hx     family hx  . Hypertension Neg Hx     family hx  . Cancer Neg Hx     cervical, prostate - family hx  . Heart disease Neg Hx     family hx  . Hyperlipidemia Neg Hx     family hx    Allergies  Allergen Reactions  . Penicillins     Current Outpatient Prescriptions on File Prior to Visit   Medication Sig Dispense Refill  . allopurinol (ZYLOPRIM) 300 MG tablet Take 1 tablet (300 mg total) by mouth daily.  90 tablet  2  . amLODipine (NORVASC) 5 MG tablet Take 1 tablet (5 mg total) by mouth daily.  90 tablet  3  . Canagliflozin 300 MG TABS Take 1 tablet (300 mg total) by mouth daily.  30 tablet    . glucose blood test strip 1 each by Other route 3 (three) times daily as needed for other.  100 each  12  . indomethacin (INDOCIN) 50 MG capsule TAKE AS NEEDED  50 capsule  1  . insulin aspart (NOVOLOG FLEXPEN) 100 UNIT/ML SOPN FlexPen 8 Units prior to each meal Add an additional 4 units if blood sugar is over 200  3 mL  6  . Lancets (FREESTYLE) lancets 1 each by Other route 3 (three) times daily as needed for other.  100 each  12  . losartan (COZAAR) 100 MG tablet Take 1 tablet (100 mg total) by mouth daily.  90 tablet  6   No current facility-administered medications on file prior to visit.    BP 140/90  Pulse 89  Temp(Src)  98 F (36.7 C) (Oral)  Resp 20  Wt 285 lb (129.275 kg)  SpO2 98%       Review of Systems  Eyes: Positive for visual disturbance.       Objective:   Physical Exam  Constitutional: He appears well-developed and well-nourished. No distress.          Assessment & Plan:   Diabetes mellitus new onset. Greatly improved glycemic control. We'll titrate levemir to 24 units. Will continue present dose of mealtime insulin and omit  midday dosing.  We'll increase metformin to 1000 mg daily if tolerated.  He does have diabetic teaching scheduled. Recheck 1 month.  Continue Invokanna

## 2013-09-03 NOTE — Patient Instructions (Signed)
Diabetic/nutritional counseling as discussed  Return in one month for follow-up

## 2013-09-10 ENCOUNTER — Telehealth: Payer: Self-pay | Admitting: Internal Medicine

## 2013-09-10 MED ORDER — INSULIN ASPART 100 UNIT/ML FLEXPEN: PEN_INJECTOR | SUBCUTANEOUS | Status: DC

## 2013-09-10 MED ORDER — INSULIN DETEMIR 100 UNIT/ML ~~LOC~~ SOLN: SUBCUTANEOUS | Status: DC

## 2013-09-10 NOTE — Telephone Encounter (Signed)
Pt states his rx for insulin aspart (NOVOLOG FLEXPEN) 100 UNIT/ML SOPN FlexPen and insulin detemir (LEVEMIR) 100 UNIT/ML injection Was not at pharm. Pt is out of samples Can you pls resend. [facility]

## 2013-09-10 NOTE — Telephone Encounter (Signed)
Pharm system is down, pt call rxs into pharm

## 2013-09-10 NOTE — Telephone Encounter (Signed)
Rx called in to pharmacy. 

## 2013-09-20 ENCOUNTER — Other Ambulatory Visit: Payer: Self-pay | Admitting: *Deleted

## 2013-09-20 MED ORDER — METFORMIN HCL ER (MOD) 500 MG PO TB24: 1000.0000 mg | ORAL_TABLET | Freq: Every day | ORAL | Status: DC

## 2013-09-20 MED ORDER — ALLOPURINOL 300 MG PO TABS: 300.0000 mg | ORAL_TABLET | Freq: Every day | ORAL | Status: DC

## 2013-09-20 MED ORDER — CANAGLIFLOZIN 300 MG PO TABS: 1.0000 | ORAL_TABLET | Freq: Every day | ORAL | Status: DC

## 2013-09-20 MED ORDER — INSULIN DETEMIR 100 UNIT/ML FLEXPEN: 24.0000 [IU] | PEN_INJECTOR | Freq: Every day | SUBCUTANEOUS | Status: DC

## 2013-09-21 ENCOUNTER — Telehealth: Payer: Self-pay | Admitting: Internal Medicine

## 2013-09-21 MED ORDER — INSULIN PEN NEEDLE 32G X 4 MM MISC: 1.0000 | Freq: Three times a day (TID) | Status: DC | PRN

## 2013-09-21 NOTE — Telephone Encounter (Signed)
Left detailed message Rx was sent to pharmacy. 

## 2013-09-21 NOTE — Telephone Encounter (Signed)
Pt needs the needle for the flexpen, 4 ml x 32 is what is states on side of box.  Pharm [facility]/ [address] Pt needs today, is leaving for thanksgiving

## 2013-09-27 ENCOUNTER — Telehealth: Payer: Self-pay | Admitting: Internal Medicine

## 2013-09-27 DIAGNOSIS — E1165 Type 2 diabetes mellitus with hyperglycemia: Secondary | ICD-10-CM

## 2013-09-27 DIAGNOSIS — IMO0002 Reserved for concepts with insufficient information to code with codable children: Secondary | ICD-10-CM

## 2013-09-27 NOTE — Telephone Encounter (Signed)
Order for referral to Endocrinology per pt request done. Called McKenzie back told her I sent order for referral to Endo as requested and someone should contact them. Told her referral for Nutritionist and Diabetic Management referral was sent back on 10/31 by Dr. Kirtland Bouchard and someone was suppose to contact them. McKenzie said they have not heard from anyone. Told her I am sorry, new referral done and someone should contact them. McKenzie verbalized understanding.

## 2013-09-27 NOTE — Telephone Encounter (Signed)
Caller: McKenzie/Spouse; Phone: [phone]; Reason for Call: Newly diagnosed with Diabetes Mellitus.  Has not received call from anyone yet about training.  Called to request referral to Dr. Wendall Mola, endocrinolgist with [facility] phone [phone].  Prefers husband work with a [employer] for diabetes management.  Friends highly recommended Dr Tedd Sias.  Please call back to advise if this is approved and appointment date/time.

## 2013-10-01 ENCOUNTER — Ambulatory Visit: Payer: PRIVATE HEALTH INSURANCE | Admitting: Internal Medicine

## 2013-10-27 ENCOUNTER — Other Ambulatory Visit: Payer: Self-pay | Admitting: Internal Medicine

## 2013-11-28 LAB — HM DIABETES FOOT EXAM

## 2013-12-06 ENCOUNTER — Ambulatory Visit: Payer: PRIVATE HEALTH INSURANCE | Admitting: Internal Medicine

## 2013-12-08 LAB — BASIC METABOLIC PANEL
CREATININE: 0.8
POTASSIUM: 3.9 mmol/L
SODIUM: 140 mmol/L (ref 137–147)

## 2013-12-08 LAB — MICROALBUMIN, URINE: MICROALB UR: 23

## 2013-12-08 LAB — HEPATIC FUNCTION PANEL
ALT: 46 U/L — AB (ref 10–40)
AST: 26 U/L
Albumin: 4.8
Alkaline Phosphatase: 48 U/L
Total Bilirubin: 0.5 mg/dL

## 2013-12-08 LAB — C-PEPTIDE: C PEPTIDE: 5.8

## 2013-12-17 LAB — HEMOGLOBIN A1C: A1c: 6

## 2013-12-26 LAB — HM DIABETES EYE EXAM

## 2014-01-21 ENCOUNTER — Encounter: Payer: Self-pay | Admitting: Family Medicine

## 2014-01-21 ENCOUNTER — Ambulatory Visit (INDEPENDENT_AMBULATORY_CARE_PROVIDER_SITE_OTHER): Payer: PRIVATE HEALTH INSURANCE | Admitting: Family Medicine

## 2014-01-21 VITALS — BP 114/76 | HR 92 | Temp 98.8°F | Ht 74.0 in | Wt 270.5 lb

## 2014-01-21 DIAGNOSIS — M109 Gout, unspecified: Secondary | ICD-10-CM

## 2014-01-21 DIAGNOSIS — E079 Disorder of thyroid, unspecified: Secondary | ICD-10-CM

## 2014-01-21 DIAGNOSIS — E119 Type 2 diabetes mellitus without complications: Secondary | ICD-10-CM

## 2014-01-21 DIAGNOSIS — I10 Essential (primary) hypertension: Secondary | ICD-10-CM

## 2014-01-21 DIAGNOSIS — G4730 Sleep apnea, unspecified: Secondary | ICD-10-CM

## 2014-01-21 NOTE — Assessment & Plan Note (Signed)
Off CPAP currently, weight loss should help.

## 2014-01-21 NOTE — Assessment & Plan Note (Addendum)
Body mass index is 34.72 kg/(m^2). Weight loss noted. Congratulated and encouraged for continued weight loss.

## 2014-01-21 NOTE — Assessment & Plan Note (Signed)
Great control on current allopurinol dose.  

## 2014-01-21 NOTE — Assessment & Plan Note (Signed)
Chronic, stable. Great control as evidenced by latest A1c. Unclear metformin dose he should be on - recommended he check with Dr. Pricilla HandlerSolum's office and notify us to update chart.

## 2014-01-21 NOTE — Progress Notes (Signed)
Pre visit review using our clinic review tool, if applicable. No additional management support is needed unless otherwise documented below in the visit note. 

## 2014-01-21 NOTE — Assessment & Plan Note (Signed)
With thyromegaly today - check TSH next blood work

## 2014-01-21 NOTE — Progress Notes (Signed)
BP 114/76  Pulse 92  Temp(Src) 98.8 F (37.1 C) (Oral)  Ht 6\' 2"  (1.88 m)  Wt 270 lb 8 oz (122.698 kg)  BMI 34.72 kg/m2   CC: transfer care from [facility]  Subjective:    Patient ID: Kyle [name], male    DOB: [birth year], 38 y.o.   MRN: [id number]  HPI: Kyle [name] is a 38 y.o. male presenting on 01/21/2014 for Establish Care   Prior saw Dr. Lesia Hausen.   Adopted children from [place] Brings labwork from Endo with latest A1c 12/08/2013 6.0%  DM - dx last fall.  Compliant with metformin (unclear dose), canagliflozin, and levemir.  To start seeing diabetes education May 2015.  Checks sugars once daily - fasting.  80-140.  No paresthesias.  No low sugars, hypoglycemic sxs.  Last eye exam 12/2013, WNL.  Foot exam every endo visit.  Recently dx with floppy eyelid syndrome.  Causing dry eyelids.  HTN - compliant with amlodipine and losartan.  No HA, vision changes, CP/tightness, SOB, leg swelling.  Gout - allopurinol 300mg  daily controlling sxs.  Wt Readings from Last 3 Encounters:  01/21/14 270 lb 8 oz (122.698 kg)  09/03/13 285 lb (129.275 kg)  08/27/13 284 lb (128.822 kg)    Preventative: Last CPE 1 yr ago Hep A done Flu shot 07/2014 Td 2009  Relevant past medical, surgical, family and social history reviewed and updated as indicated.  Allergies and medications reviewed and updated. Current Outpatient Prescriptions on File Prior to Visit  Medication Sig  . allopurinol (ZYLOPRIM) 300 MG tablet Take 1 tablet (300 mg total) by mouth daily.  . Canagliflozin 300 MG TABS Take 1 tablet (300 mg total) by mouth daily.  Marland Kitchen glucose blood test strip 1 each by Other route 3 (three) times daily as needed for other.  . Lancets (FREESTYLE) lancets 1 each by Other route 3 (three) times daily as needed for other.  . losartan (COZAAR) 100 MG tablet Take 1 tablet (100 mg total) by mouth daily.  . metFORMIN (GLUMETZA) 500 MG (MOD) 24 hr tablet Take 2 tablets (1,000 mg total) by mouth daily  with breakfast.  . indomethacin (INDOCIN) 50 MG capsule TAKE AS NEEDED  . Insulin Pen Needle 32G X 4 MM MISC 1 each by Does not apply route 3 (three) times daily as needed.   No current facility-administered medications on file prior to visit.    Review of Systems Per HPI unless specifically indicated above    Objective:    BP 114/76  Pulse 92  Temp(Src) 98.8 F (37.1 C) (Oral)  Ht 6\' 2"  (1.88 m)  Wt 270 lb 8 oz (122.698 kg)  BMI 34.72 kg/m2  Physical Exam  Nursing note and vitals reviewed. Constitutional: He appears well-developed and well-nourished. No distress.  HENT:  Head: Normocephalic and atraumatic.  Mouth/Throat: Oropharynx is clear and moist. No oropharyngeal exudate.  Eyes: Conjunctivae and EOM are normal. Pupils are equal, round, and reactive to light.  Neck: Normal range of motion. Neck supple. Thyromegaly present.  Cardiovascular: Normal rate, regular rhythm, normal heart sounds and intact distal pulses.   No murmur heard. Pulmonary/Chest: Effort normal and breath sounds normal. No respiratory distress. He has no wheezes. He has no rales.  Musculoskeletal: He exhibits no edema.  Lymphadenopathy:    He has no cervical adenopathy.  Skin: Skin is warm and dry. No rash noted.  Psychiatric: He has a normal mood and affect.      Assessment & Plan:   Problem  List Items Addressed This Visit   Diabetes type 2, controlled - Primary     Chronic, stable. Great control as evidenced by latest A1c. Unclear metformin dose he should be on - recommended he check with Dr. Pricilla HandlerSolum's office and notify us to update chart.    Relevant Medications      Insulin Detemir (LEVEMIR) 100 UNIT/ML Pen   Gout, unspecified     Great control on current allopurinol dose.    HYPERTENSION     Chronic, stable. Continue amlodipine 10mg  and losartan 100mg  daily.    Relevant Medications      amLODipine (NORVASC) 10 MG tablet   Obesity     Body mass index is 34.72 kg/(m^2). Weight loss  noted. Congratulated and encouraged for continued weight loss.    SLEEP APNEA     Off CPAP currently, weight loss should help.    THYROID STIMULATING HORMONE, ABNORMAL     With thyromegaly today - check TSH next blood work         Follow up plan: Return in about 4 months (around 05/23/2014), or as needed, for annual exam, prior fasting for blood work.

## 2014-01-21 NOTE — Patient Instructions (Addendum)
Call Dr. Pricilla HandlerSolum's office to clarify metformin dose (immediate or extended release) Return as needed or in 3-4 months for a physical with fasting blood work prior. Good to meet you today, call us with questions. Continue f/u with Dr Tedd SiasSolum for diabetes - congratulations on great control!

## 2014-01-21 NOTE — Assessment & Plan Note (Signed)
Chronic, stable. Continue amlodipine 10mg  and losartan 100mg  daily.

## 2014-01-22 ENCOUNTER — Telehealth: Payer: Self-pay | Admitting: Family Medicine

## 2014-01-22 NOTE — Telephone Encounter (Signed)
Relevant patient education assigned to patient using Emmi. ° °

## 2014-02-02 ENCOUNTER — Telehealth: Payer: Self-pay

## 2014-02-02 NOTE — Telephone Encounter (Signed)
Relevant patient education assigned to patient using Emmi. ° °

## 2014-02-08 ENCOUNTER — Telehealth: Payer: Self-pay | Admitting: *Deleted

## 2014-02-08 NOTE — Telephone Encounter (Signed)
Late entry- patient called and advised that per Dr. Tedd SiasSolum he is taking regular Metformin 1000 mg BID. Med list updated.

## 2014-02-10 ENCOUNTER — Other Ambulatory Visit: Payer: Self-pay | Admitting: Internal Medicine

## 2014-02-15 ENCOUNTER — Encounter: Payer: Self-pay | Admitting: *Deleted

## 2014-05-14 ENCOUNTER — Other Ambulatory Visit: Payer: Self-pay | Admitting: Family Medicine

## 2014-05-14 DIAGNOSIS — M109 Gout, unspecified: Secondary | ICD-10-CM

## 2014-05-14 DIAGNOSIS — E079 Disorder of thyroid, unspecified: Secondary | ICD-10-CM

## 2014-05-14 DIAGNOSIS — I10 Essential (primary) hypertension: Secondary | ICD-10-CM

## 2014-05-14 DIAGNOSIS — E669 Obesity, unspecified: Secondary | ICD-10-CM

## 2014-05-14 DIAGNOSIS — E119 Type 2 diabetes mellitus without complications: Secondary | ICD-10-CM

## 2014-05-18 ENCOUNTER — Other Ambulatory Visit (INDEPENDENT_AMBULATORY_CARE_PROVIDER_SITE_OTHER): Payer: PRIVATE HEALTH INSURANCE

## 2014-05-18 DIAGNOSIS — E119 Type 2 diabetes mellitus without complications: Secondary | ICD-10-CM

## 2014-05-18 DIAGNOSIS — E669 Obesity, unspecified: Secondary | ICD-10-CM

## 2014-05-18 DIAGNOSIS — E079 Disorder of thyroid, unspecified: Secondary | ICD-10-CM

## 2014-05-18 DIAGNOSIS — M109 Gout, unspecified: Secondary | ICD-10-CM

## 2014-05-18 DIAGNOSIS — I10 Essential (primary) hypertension: Secondary | ICD-10-CM

## 2014-05-18 LAB — LIPID PANEL
CHOL/HDL RATIO: 5
Cholesterol: 153 mg/dL (ref 0–200)
HDL: 28 mg/dL — ABNORMAL LOW (ref >39.00)
NONHDL: 125
TRIGLYCERIDES: 252 mg/dL — AB (ref 0.0–149.0)
VLDL: 50.4 mg/dL — ABNORMAL HIGH (ref 0.0–40.0)

## 2014-05-18 LAB — URIC ACID: Uric Acid, Serum: 4.6 mg/dL (ref 4.0–7.8)

## 2014-05-18 LAB — BASIC METABOLIC PANEL
BUN: 14 mg/dL (ref 6–23)
CALCIUM: 9.3 mg/dL (ref 8.4–10.5)
CO2: 31 mEq/L (ref 19–32)
Chloride: 105 mEq/L (ref 96–112)
Creatinine, Ser: 0.8 mg/dL (ref 0.4–1.5)
GFR: 116.5 mL/min (ref >60.00)
Glucose, Bld: 100 mg/dL — ABNORMAL HIGH (ref 70–99)
Potassium: 4.4 mEq/L (ref 3.5–5.1)
SODIUM: 140 meq/L (ref 135–145)

## 2014-05-18 LAB — LDL CHOLESTEROL, DIRECT: Direct LDL: 87.8 mg/dL

## 2014-05-18 LAB — T4, FREE: FREE T4: 1.06 ng/dL (ref 0.60–1.60)

## 2014-05-18 LAB — HEMOGLOBIN A1C: HEMOGLOBIN A1C: 5.9 % (ref 4.6–6.5)

## 2014-05-18 LAB — TSH: TSH: 0.72 u[IU]/mL (ref 0.35–4.50)

## 2014-05-19 LAB — T3: T3 TOTAL: 135.8 ng/dL (ref 80.0–204.0)

## 2014-05-25 ENCOUNTER — Ambulatory Visit (INDEPENDENT_AMBULATORY_CARE_PROVIDER_SITE_OTHER): Payer: PRIVATE HEALTH INSURANCE | Admitting: Family Medicine

## 2014-05-25 ENCOUNTER — Encounter (INDEPENDENT_AMBULATORY_CARE_PROVIDER_SITE_OTHER): Payer: Self-pay

## 2014-05-25 ENCOUNTER — Encounter: Payer: Self-pay | Admitting: Family Medicine

## 2014-05-25 VITALS — BP 124/86 | HR 84 | Temp 97.6°F | Ht 74.0 in | Wt 272.0 lb

## 2014-05-25 DIAGNOSIS — Z23 Encounter for immunization: Secondary | ICD-10-CM

## 2014-05-25 DIAGNOSIS — E119 Type 2 diabetes mellitus without complications: Secondary | ICD-10-CM

## 2014-05-25 DIAGNOSIS — E785 Hyperlipidemia, unspecified: Secondary | ICD-10-CM

## 2014-05-25 DIAGNOSIS — E1169 Type 2 diabetes mellitus with other specified complication: Secondary | ICD-10-CM | POA: Insufficient documentation

## 2014-05-25 DIAGNOSIS — Z0000 Encounter for general adult medical examination without abnormal findings: Secondary | ICD-10-CM | POA: Insufficient documentation

## 2014-05-25 DIAGNOSIS — M109 Gout, unspecified: Secondary | ICD-10-CM

## 2014-05-25 DIAGNOSIS — Z0001 Encounter for general adult medical examination with abnormal findings: Secondary | ICD-10-CM | POA: Insufficient documentation

## 2014-05-25 DIAGNOSIS — I10 Essential (primary) hypertension: Secondary | ICD-10-CM

## 2014-05-25 DIAGNOSIS — E669 Obesity, unspecified: Secondary | ICD-10-CM

## 2014-05-25 HISTORY — DX: Hyperlipidemia, unspecified: E78.5

## 2014-05-25 MED ORDER — ALLOPURINOL 300 MG PO TABS: 300.0000 mg | ORAL_TABLET | Freq: Every day | ORAL | Status: DC

## 2014-05-25 MED ORDER — LOSARTAN POTASSIUM 100 MG PO TABS: 100.0000 mg | ORAL_TABLET | Freq: Every day | ORAL | Status: DC

## 2014-05-25 MED ORDER — AMLODIPINE BESYLATE 10 MG PO TABS: 10.0000 mg | ORAL_TABLET | Freq: Every day | ORAL | Status: DC

## 2014-05-25 NOTE — Addendum Note (Signed)
Addended by: Josph MachoANCE, Vylet Maffia A on: 05/25/2014 08:52 AM   Modules accepted: Orders

## 2014-05-25 NOTE — Assessment & Plan Note (Signed)
Chronic, well controlled. Continue current regimen. Followed by endo Dr. Tedd SiasSolum

## 2014-05-25 NOTE — Assessment & Plan Note (Addendum)
High trig, low HDL. Stable LDL. Reviewed dietary and lifestyle changes to improve chol levels. Discussed possible addition of statin if chol levels not at goal next visit. Strong fmhx CAD.

## 2014-05-25 NOTE — Progress Notes (Signed)
BP 124/86  Pulse 84  Temp(Src) 97.6 F (36.4 C) (Oral)  Ht 6\' 2"  (1.88 m)  Wt 272 lb (123.378 kg)  BMI 34.91 kg/m2   CC: CPE  Subjective:    Patient ID: Kyle [name], male    DOB: 16-May-1976, 38 y.o.   MRN: [id number]  HPI: Kyle [name] is a 38 y.o. male presenting on 05/25/2014 for Annual Exam   Tick bite 6 wks ago to tip of penis - removed. No sxs after bite.  DM - dx last fall. Compliant with metformin 1000mg  bid, canagliflozin 300mg  daily, and levemir 15 u daily. Diabetes education May 2015. Checks sugars once daily - fasting 80-100. No paresthesias. No low sugars, hypoglycemic sxs. Last eye exam 12/2013, WNL. Foot exam every endo visit. Has f/u with endo December 2015.  HTN - complaint with amlodipine and losartan. No HA, vision changes, CP/tightness, SOB, leg swelling.  Gout - compliant with allopurinol without recent flares.  Preventative:  Hep A done  Flu shot 07/2014  Td 2009 Pneumovax today. Seat belt use discussed Sunscreen use discussed. No changing moles.  Caffeine: 4 cups/day Lives with wife and 2 children (adopted from [place]) Occupation: Emergency planning/management officer for [employer] Activity: no regular exercise, walks at work Diet: good water daily, fruits/vegetables daily  Relevant past medical, surgical, family and social history reviewed and updated as indicated.  Allergies and medications reviewed and updated. Current Outpatient Prescriptions on File Prior to Visit  Medication Sig  . Canagliflozin 300 MG TABS Take 1 tablet (300 mg total) by mouth daily.  Marland Kitchen glucose blood test strip 1 each by Other route 3 (three) times daily as needed for other.  . indomethacin (INDOCIN) 50 MG capsule TAKE AS NEEDED  . Insulin Pen Needle 32G X 4 MM MISC 1 each by Does not apply route 3 (three) times daily as needed.  . Lancets (FREESTYLE) lancets 1 each by Other route 3 (three) times daily as needed for other.  . metFORMIN (GLUCOPHAGE) 500 MG tablet Take 1,000 mg by mouth 2  (two) times daily with a meal.   No current facility-administered medications on file prior to visit.    Review of Systems  Constitutional: Negative for fever, chills, activity change, appetite change, fatigue and unexpected weight change.  HENT: Positive for sinus pressure. Negative for hearing loss.   Eyes: Negative for visual disturbance.  Respiratory: Negative for cough, chest tightness, shortness of breath and wheezing.   Cardiovascular: Negative for chest pain, palpitations and leg swelling.  Gastrointestinal: Negative for nausea, vomiting, abdominal pain, diarrhea, constipation, blood in stool and abdominal distention.  Genitourinary: Negative for hematuria and difficulty urinating.  Musculoskeletal: Negative for arthralgias, myalgias and neck pain.  Skin: Negative for rash.  Neurological: Positive for dizziness (?mild hypoglycemia). Negative for seizures, syncope and headaches.  Hematological: Negative for adenopathy. Does not bruise/bleed easily.  Psychiatric/Behavioral: Negative for dysphoric mood. The patient is not nervous/anxious.    Per HPI unless specifically indicated above    Objective:    BP 124/86  Pulse 84  Temp(Src) 97.6 F (36.4 C) (Oral)  Ht 6\' 2"  (1.88 m)  Wt 272 lb (123.378 kg)  BMI 34.91 kg/m2  Physical Exam  Nursing note and vitals reviewed. Constitutional: He is oriented to person, place, and time. He appears well-developed and well-nourished. No distress.  HENT:  Head: Normocephalic and atraumatic.  Right Ear: Hearing, tympanic membrane, external ear and ear canal normal.  Left Ear: Hearing, tympanic membrane, external ear and ear  canal normal.  Nose: Nose normal.  Mouth/Throat: Uvula is midline, oropharynx is clear and moist and mucous membranes are normal. No oropharyngeal exudate, posterior oropharyngeal edema or posterior oropharyngeal erythema.  Eyes: Conjunctivae and EOM are normal. Pupils are equal, round, and reactive to light. No scleral  icterus.  Neck: Normal range of motion. Neck supple. No thyromegaly present.  Cardiovascular: Normal rate, regular rhythm, normal heart sounds and intact distal pulses.   No murmur heard. Pulses:      Radial pulses are 2+ on the right side, and 2+ on the left side.  Pulmonary/Chest: Effort normal and breath sounds normal. No respiratory distress. He has no wheezes. He has no rales.  Abdominal: Soft. Bowel sounds are normal. He exhibits no distension and no mass. There is no tenderness. There is no rebound and no guarding.  Musculoskeletal: Normal range of motion. He exhibits no edema.  Lymphadenopathy:    He has no cervical adenopathy.  Neurological: He is alert and oriented to person, place, and time.  CN grossly intact, station and gait intact  Skin: Skin is warm and dry. No rash noted.  Psychiatric: He has a normal mood and affect. His behavior is normal. Judgment and thought content normal.   Results for orders placed in visit on 05/18/14  URIC ACID      Result Value Ref Range   Uric Acid, Serum 4.6  4.0 - 7.8 mg/dL  LIPID PANEL      Result Value Ref Range   Cholesterol 153  0 - 200 mg/dL   Triglycerides 161.0252.0 (*) 0.0 - 149.0 mg/dL   HDL 96.0428.00 (*) >54.09>39.00 mg/dL   VLDL 81.150.4 (*) 0.0 - 91.440.0 mg/dL   Total CHOL/HDL Ratio 5     NonHDL 125.00    TSH      Result Value Ref Range   TSH 0.72  0.35 - 4.50 uIU/mL  BASIC METABOLIC PANEL      Result Value Ref Range   Sodium 140  135 - 145 mEq/L   Potassium 4.4  3.5 - 5.1 mEq/L   Chloride 105  96 - 112 mEq/L   CO2 31  19 - 32 mEq/L   Glucose, Bld 100 (*) 70 - 99 mg/dL   BUN 14  6 - 23 mg/dL   Creatinine, Ser 0.8  0.4 - 1.5 mg/dL   Calcium 9.3  8.4 - 78.210.5 mg/dL   GFR [id number]  >30.86>60.00 mL/min  HEMOGLOBIN A1C      Result Value Ref Range   Hemoglobin A1C 5.9  4.6 - 6.5 %  T4, FREE      Result Value Ref Range   Free T4 1.06  0.60 - 1.60 ng/dL  T3      Result Value Ref Range   T3, Total 135.8  80.0 - 204.0 ng/dL  LDL CHOLESTEROL, DIRECT        Result Value Ref Range   Direct LDL 87.8        Assessment & Plan:   Problem List Items Addressed This Visit   Obesity     Encouraged start regular walking. Body mass index is 34.91 kg/(m^2).    Relevant Medications      Insulin Detemir (LEVEMIR FLEXTOUCH) 100 UNIT/ML Pen   HYPERTENSION     Chronic, stable on amlodipine 10 and losartan 100    Relevant Medications      amLODIpine (NORVASC) tablet      losartan (COZAAR) tablet   Health maintenance examination - Primary  Preventative protocols reviewed and updated unless pt declined. Discussed healthy diet and lifestyle.  Pneumovax today.    Gout, unspecified     Chronic, stable on allopurinol    Dyslipidemia     High trig, low HDL. Stable LDL. Reviewed dietary and lifestyle changes to improve chol levels. Discussed possible addition of statin if chol levels not at goal next visit. Strong fmhx CAD.    Diabetes type 2, controlled     Chronic, well controlled. Continue current regimen. Followed by endo Dr. Tedd Sias     Relevant Medications      Insulin Detemir (LEVEMIR FLEXTOUCH) 100 UNIT/ML Pen      losartan (COZAAR) tablet       Follow up plan: Return in about 1 year (around 05/26/2015), or as needed, for annual exam, prior fasting for blood work.

## 2014-05-25 NOTE — Assessment & Plan Note (Signed)
Chronic, stable on allopurinol 

## 2014-05-25 NOTE — Progress Notes (Signed)
Pre visit review using our clinic review tool, if applicable. No additional management support is needed unless otherwise documented below in the visit note. 

## 2014-05-25 NOTE — Assessment & Plan Note (Signed)
Encouraged start regular walking. Body mass index is 34.91 kg/(m^2).

## 2014-05-25 NOTE — Patient Instructions (Addendum)
Pneumonia shot today. Good to see you today, call us with questions. Return as needed or in 1 year for next physical. Work on fish in diet and slowly increase aerobic exercises for better cholesterol control.

## 2014-05-25 NOTE — Assessment & Plan Note (Signed)
Chronic, stable on amlodipine 10 and losartan 100

## 2014-05-25 NOTE — Assessment & Plan Note (Signed)
Preventative protocols reviewed and updated unless pt declined. Discussed healthy diet and lifestyle.  Pneumovax today.

## 2014-06-03 ENCOUNTER — Ambulatory Visit (INDEPENDENT_AMBULATORY_CARE_PROVIDER_SITE_OTHER): Payer: PRIVATE HEALTH INSURANCE | Admitting: Family Medicine

## 2014-06-03 ENCOUNTER — Encounter: Payer: Self-pay | Admitting: Family Medicine

## 2014-06-03 ENCOUNTER — Other Ambulatory Visit: Payer: Self-pay | Admitting: *Deleted

## 2014-06-03 VITALS — BP 124/90 | HR 96 | Temp 97.9°F | Wt 270.5 lb

## 2014-06-03 DIAGNOSIS — J069 Acute upper respiratory infection, unspecified: Secondary | ICD-10-CM

## 2014-06-03 MED ORDER — FLUTICASONE PROPIONATE 50 MCG/ACT NA SUSP: 2.0000 | Freq: Every day | NASAL | Status: DC

## 2014-06-03 MED ORDER — BENZONATATE 200 MG PO CAPS: 200.0000 mg | ORAL_CAPSULE | Freq: Three times a day (TID) | ORAL | Status: DC | PRN

## 2014-06-03 MED ORDER — AZITHROMYCIN 250 MG PO TABS
ORAL_TABLET | ORAL | Status: DC
Start: 2014-06-03 — End: 2015-05-31

## 2014-06-03 NOTE — Progress Notes (Signed)
Pre visit review using our clinic review tool, if applicable. No additional management support is needed unless otherwise documented below in the visit note.  Sx started last night.  Felt tired last night, felt weak, went to bed early.  Didn't sleep well.  Out of work today.  Exhausted.  "My head is in a fog."  Can get lightheaded on standing, right after standing. Tickle in his throat.  Throat clearing, not a true cough.  Head pressure and HA.    [place]    Meds, vitals, and allergies reviewed.   ROS: See HPI.  Otherwise, noncontributory.  GEN: nad, alert and oriented HEENT: mucous membranes moist, tm w/o erythema, nasal exam w/o erythema, clear discharge noted,  OP with cobblestoning, sinuses mildly ttp B NECK: supple w/o LA CV: rrr.   PULM: ctab, no inc wob EXT: no edema SKIN: no acute rash

## 2014-06-03 NOTE — Patient Instructions (Signed)
Use tessalon for the cough if needed.  Start the flonase today.  Use the antibiotics if the symptoms continue into next week.  Take care.

## 2014-06-05 DIAGNOSIS — J069 Acute upper respiratory infection, unspecified: Secondary | ICD-10-CM | POA: Insufficient documentation

## 2014-06-05 NOTE — Assessment & Plan Note (Signed)
Leaving town soon.  Use tessalon for the cough if needed. Start the flonase today. Use the antibiotics if the symptoms continue into next week.  D/w pt.  Likely viral, hold abx for now.  He agrees.  F/u prn.  Nontoxic.

## 2014-07-12 ENCOUNTER — Telehealth: Payer: Self-pay | Admitting: *Deleted

## 2014-07-12 NOTE — Telephone Encounter (Signed)
Spoke to pt and advised him he is needing BP check for DM bundle. Pt denies and states that his wife is a CNA and he will have her to monitor BP

## 2014-09-30 LAB — HEMOGLOBIN A1C: Hgb A1c MFr Bld: 5.7 % (ref 4.0–6.0)

## 2014-10-10 ENCOUNTER — Encounter: Payer: Self-pay | Admitting: Family Medicine

## 2015-04-28 LAB — HM DIABETES FOOT EXAM

## 2015-05-31 ENCOUNTER — Other Ambulatory Visit: Payer: Self-pay | Admitting: Family Medicine

## 2015-05-31 DIAGNOSIS — I10 Essential (primary) hypertension: Secondary | ICD-10-CM

## 2015-05-31 DIAGNOSIS — M109 Gout, unspecified: Secondary | ICD-10-CM

## 2015-05-31 DIAGNOSIS — E119 Type 2 diabetes mellitus without complications: Secondary | ICD-10-CM

## 2015-05-31 DIAGNOSIS — E785 Hyperlipidemia, unspecified: Secondary | ICD-10-CM

## 2015-06-02 ENCOUNTER — Other Ambulatory Visit (INDEPENDENT_AMBULATORY_CARE_PROVIDER_SITE_OTHER): Payer: BLUE CROSS/BLUE SHIELD

## 2015-06-02 DIAGNOSIS — I10 Essential (primary) hypertension: Secondary | ICD-10-CM | POA: Diagnosis not present

## 2015-06-02 DIAGNOSIS — E119 Type 2 diabetes mellitus without complications: Secondary | ICD-10-CM | POA: Diagnosis not present

## 2015-06-02 DIAGNOSIS — E785 Hyperlipidemia, unspecified: Secondary | ICD-10-CM | POA: Diagnosis not present

## 2015-06-02 DIAGNOSIS — M109 Gout, unspecified: Secondary | ICD-10-CM | POA: Diagnosis not present

## 2015-06-02 LAB — URIC ACID: Uric Acid, Serum: 4.2 mg/dL (ref 4.0–7.8)

## 2015-06-02 LAB — COMPREHENSIVE METABOLIC PANEL
ALT: 42 U/L (ref 0–53)
AST: 24 U/L (ref 0–37)
Albumin: 4.3 g/dL (ref 3.5–5.2)
Alkaline Phosphatase: 39 U/L (ref 39–117)
BUN: 17 mg/dL (ref 6–23)
CO2: 29 mEq/L (ref 19–32)
CREATININE: 0.76 mg/dL (ref 0.40–1.50)
Calcium: 9.3 mg/dL (ref 8.4–10.5)
Chloride: 104 mEq/L (ref 96–112)
GFR: 121.16 mL/min (ref >60.00)
GLUCOSE: 103 mg/dL — AB (ref 70–99)
POTASSIUM: 4.4 meq/L (ref 3.5–5.1)
Sodium: 140 mEq/L (ref 135–145)
TOTAL PROTEIN: 6.8 g/dL (ref 6.0–8.3)
Total Bilirubin: 0.4 mg/dL (ref 0.2–1.2)

## 2015-06-02 LAB — LIPID PANEL
CHOLESTEROL: 147 mg/dL (ref 0–200)
HDL: 28.5 mg/dL — ABNORMAL LOW (ref >39.00)
NONHDL: 118.87
TRIGLYCERIDES: 219 mg/dL — AB (ref 0.0–149.0)
Total CHOL/HDL Ratio: 5
VLDL: 43.8 mg/dL — AB (ref 0.0–40.0)

## 2015-06-02 LAB — MICROALBUMIN / CREATININE URINE RATIO
CREATININE, U: 128.8 mg/dL
Microalb Creat Ratio: 2.3 mg/g (ref 0.0–30.0)
Microalb, Ur: 2.9 mg/dL — ABNORMAL HIGH (ref 0.0–1.9)

## 2015-06-02 LAB — LDL CHOLESTEROL, DIRECT: LDL DIRECT: 91 mg/dL

## 2015-06-02 LAB — HEMOGLOBIN A1C: HEMOGLOBIN A1C: 5.6 % (ref 4.6–6.5)

## 2015-06-07 ENCOUNTER — Encounter: Payer: PRIVATE HEALTH INSURANCE | Admitting: Family Medicine

## 2015-06-23 ENCOUNTER — Ambulatory Visit (INDEPENDENT_AMBULATORY_CARE_PROVIDER_SITE_OTHER): Payer: BLUE CROSS/BLUE SHIELD | Admitting: Family Medicine

## 2015-06-23 ENCOUNTER — Encounter: Payer: Self-pay | Admitting: Family Medicine

## 2015-06-23 VITALS — BP 110/70 | HR 92 | Temp 98.0°F | Ht 74.0 in | Wt 266.0 lb

## 2015-06-23 DIAGNOSIS — Z0000 Encounter for general adult medical examination without abnormal findings: Secondary | ICD-10-CM | POA: Diagnosis not present

## 2015-06-23 DIAGNOSIS — M109 Gout, unspecified: Secondary | ICD-10-CM

## 2015-06-23 DIAGNOSIS — E119 Type 2 diabetes mellitus without complications: Secondary | ICD-10-CM

## 2015-06-23 DIAGNOSIS — I10 Essential (primary) hypertension: Secondary | ICD-10-CM

## 2015-06-23 DIAGNOSIS — E785 Hyperlipidemia, unspecified: Secondary | ICD-10-CM

## 2015-06-23 DIAGNOSIS — G4733 Obstructive sleep apnea (adult) (pediatric): Secondary | ICD-10-CM

## 2015-06-23 DIAGNOSIS — E669 Obesity, unspecified: Secondary | ICD-10-CM

## 2015-06-23 MED ORDER — ALLOPURINOL 300 MG PO TABS: 300.0000 mg | ORAL_TABLET | Freq: Every day | ORAL | Status: DC

## 2015-06-23 MED ORDER — AMLODIPINE BESYLATE 10 MG PO TABS: 10.0000 mg | ORAL_TABLET | Freq: Every day | ORAL | Status: DC

## 2015-06-23 MED ORDER — LOSARTAN POTASSIUM 100 MG PO TABS: 100.0000 mg | ORAL_TABLET | Freq: Every day | ORAL | Status: DC

## 2015-06-23 NOTE — Assessment & Plan Note (Signed)
Discussed healthy diet and lifestyle changes to affect sustainable weight loss  

## 2015-06-23 NOTE — Assessment & Plan Note (Addendum)
Chronic, stable. Continue losartan and amlodipine.  Pt will montor bp and notify me if low bp or low bp sxs.

## 2015-06-23 NOTE — Assessment & Plan Note (Signed)
Chronic, well controlled. Off insulin. F/u with Dr Tedd Sias.

## 2015-06-23 NOTE — Patient Instructions (Addendum)
Let me know if blood pressures consistently low at home <110 or worsening dizzy symptoms. Return as needed or in 1 year for next physical We will continue to watch cholesterol. Decrease added sugars, eliminate trans fats, increase fiber and limit alcohol.  All these changes together can drop triglycerides by almost 50%.   Health Maintenance A healthy lifestyle and preventative care can promote health and wellness.  Maintain regular health, dental, and eye exams.  Eat a healthy diet. Foods like vegetables, fruits, whole grains, low-fat dairy products, and lean protein foods contain the nutrients you need and are low in calories. Decrease your intake of foods high in solid fats, added sugars, and salt. Get information about a proper diet from your health care provider, if necessary.  Regular physical exercise is one of the most important things you can do for your health. Most adults should get at least 150 minutes of moderate-intensity exercise (any activity that increases your heart rate and causes you to sweat) each week. In addition, most adults need muscle-strengthening exercises on 2 or more days a week.   Maintain a healthy weight. The body mass index (BMI) is a screening tool to identify possible weight problems. It provides an estimate of body fat based on height and weight. Your health care provider can find your BMI and can help you achieve or maintain a healthy weight. For males 20 years and older:  A BMI below 18.5 is considered underweight.  A BMI of 18.5 to 24.9 is normal.  A BMI of 25 to 29.9 is considered overweight.  A BMI of 30 and above is considered obese.  Maintain normal blood lipids and cholesterol by exercising and minimizing your intake of saturated fat. Eat a balanced diet with plenty of fruits and vegetables. Blood tests for lipids and cholesterol should begin at age 68 and be repeated every 5 years. If your lipid or cholesterol levels are high, you are over age 21,  or you are at high risk for heart disease, you may need your cholesterol levels checked more frequently.Ongoing high lipid and cholesterol levels should be treated with medicines if diet and exercise are not working.  If you smoke, find out from your health care provider how to quit. If you do not use tobacco, do not start.  Lung cancer screening is recommended for adults aged 55-80 years who are at high risk for developing lung cancer because of a history of smoking. A yearly low-dose CT scan of the lungs is recommended for people who have at least a 30-pack-year history of smoking and are current smokers or have quit within the past 15 years. A pack year of smoking is smoking an average of 1 pack of cigarettes a day for 1 year (for example, a 30-pack-year history of smoking could mean smoking 1 pack a day for 30 years or 2 packs a day for 15 years). Yearly screening should continue until the smoker has stopped smoking for at least 15 years. Yearly screening should be stopped for people who develop a health problem that would prevent them from having lung cancer treatment.  If you choose to drink alcohol, do not have more than 2 drinks per day. One drink is considered to be 12 oz (360 mL) of beer, 5 oz (150 mL) of wine, or 1.5 oz (45 mL) of liquor.  Avoid the use of street drugs. Do not share needles with anyone. Ask for help if you need support or instructions about stopping the use  of drugs.  High blood pressure causes heart disease and increases the risk of stroke. Blood pressure should be checked at least every 1-2 years. Ongoing high blood pressure should be treated with medicines if weight loss and exercise are not effective.  If you are 74-30 years old, ask your health care provider if you should take aspirin to prevent heart disease.  Diabetes screening involves taking a blood sample to check your fasting blood sugar level. This should be done once every 3 years after age 49 if you are at a  normal weight and without risk factors for diabetes. Testing should be considered at a younger age or be carried out more frequently if you are overweight and have at least 1 risk factor for diabetes.  Colorectal cancer can be detected and often prevented. Most routine colorectal cancer screening begins at the age of 47 and continues through age 17. However, your health care provider may recommend screening at an earlier age if you have risk factors for colon cancer. On a yearly basis, your health care provider may provide home test kits to check for hidden blood in the stool. A small camera at the end of a tube may be used to directly examine the colon (sigmoidoscopy or colonoscopy) to detect the earliest forms of colorectal cancer. Talk to your health care provider about this at age 82 when routine screening begins. A direct exam of the colon should be repeated every 5-10 years through age 1, unless early forms of precancerous polyps or small growths are found.  People who are at an increased risk for hepatitis B should be screened for this virus. You are considered at high risk for hepatitis B if:  You were born in a country where hepatitis B occurs often. Talk with your health care provider about which countries are considered high risk.  Your parents were born in a high-risk country and you have not received a shot to protect against hepatitis B (hepatitis B vaccine).  You have HIV or AIDS.  You use needles to inject street drugs.  You live with, or have sex with, someone who has hepatitis B.  You are a man who has sex with other men (MSM).  You get hemodialysis treatment.  You take certain medicines for conditions like cancer, organ transplantation, and autoimmune conditions.  Hepatitis C blood testing is recommended for all people born from [birth year] through 1965 and any individual with known risk factors for hepatitis C.  Healthy men should no longer receive prostate-specific antigen  (PSA) blood tests as part of routine cancer screening. Talk to your health care provider about prostate cancer screening.  Testicular cancer screening is not recommended for adolescents or adult males who have no symptoms. Screening includes self-exam, a health care provider exam, and other screening tests. Consult with your health care provider about any symptoms you have or any concerns you have about testicular cancer.  Practice safe sex. Use condoms and avoid high-risk sexual practices to reduce the spread of sexually transmitted infections (STIs).  You should be screened for STIs, including gonorrhea and chlamydia if:  You are sexually active and are younger than 24 years.  You are older than 24 years, and your health care provider tells you that you are at risk for this type of infection.  Your sexual activity has changed since you were last screened, and you are at an increased risk for chlamydia or gonorrhea. Ask your health care provider if you are at risk.  If you are at risk of being infected with HIV, it is recommended that you take a prescription medicine daily to prevent HIV infection. This is called pre-exposure prophylaxis (PrEP). You are considered at risk if:  You are a man who has sex with other men (MSM).  You are a heterosexual man who is sexually active with multiple partners.  You take drugs by injection.  You are sexually active with a partner who has HIV.  Talk with your health care provider about whether you are at high risk of being infected with HIV. If you choose to begin PrEP, you should first be tested for HIV. You should then be tested every 3 months for as long as you are taking PrEP.  Use sunscreen. Apply sunscreen liberally and repeatedly throughout the day. You should seek shade when your shadow is shorter than you. Protect yourself by wearing long sleeves, pants, a wide-brimmed hat, and sunglasses year round whenever you are outdoors.  Tell your health  care provider of new moles or changes in moles, especially if there is a change in shape or color. Also, tell your health care provider if a mole is larger than the size of a pencil eraser.  A one-time screening for abdominal aortic aneurysm (AAA) and surgical repair of large AAAs by ultrasound is recommended for men aged 65-75 years who are current or former smokers.  Stay current with your vaccines (immunizations). Document Released: 04/11/2008 Document Revised: 10/19/2013 Document Reviewed: 03/11/2011 [facility] Patient Information 2015 [place], [place]. This information is not intended to replace advice given to you by your health care provider. Make sure you discuss any questions you have with your health care provider.

## 2015-06-23 NOTE — Assessment & Plan Note (Signed)
Reviewed chol levels - LDL at goal. Consider statin in future. Pt declines for now. Discussed diet changes to improve trig levels

## 2015-06-23 NOTE — Progress Notes (Signed)
Pre visit review using our clinic review tool, if applicable. No additional management support is needed unless otherwise documented below in the visit note. 

## 2015-06-23 NOTE — Progress Notes (Signed)
BP 110/70 mmHg  Pulse 92  Temp(Src) 98 F (36.7 C) (Tympanic)  Ht  (1.88 m)  Wt 266 lb (120.657 kg)  BMI 34.14 kg/m2  SpO2 98%   CC: CPE  Subjective:    Patient ID: Kyle [name], male    DOB: [birth year], 39 y.o.   MRN: [id number]  HPI: Kyle [name] is a 39 y.o. male presenting on 06/23/2015 for Annual Exam   DM - followed by Dr Tedd Sias. Last A1c 5.6%. Taken off insulin and doing well.  HTN - controlled on current regimen. BP Readings from Last 3 Encounters:  06/23/15 110/70  06/03/14 124/90  05/25/14 124/86    Gout - on allopurinol and no recent flares. OSA - not on CPAP.   Preventative:  Hep A done  Flu shot 07/2014  Td 2009 Pneumovax today. Seat belt use discussed Sunscreen use discussed. No changing moles.   Caffeine: 4 cups/day Lives with wife and 2 children (adopted from [place]) Occupation: Production designer, theatre/television/film at [employer] Activity: 4x/wk calisthetics Diet: good water daily, fruits/vegetables daily  Relevant past medical, surgical, family and social history reviewed and updated as indicated. Interim medical history since our last visit reviewed. Allergies and medications reviewed and updated. Current Outpatient Prescriptions on File Prior to Visit  Medication Sig  . Canagliflozin 300 MG TABS Take 1 tablet (300 mg total) by mouth daily.  . indomethacin (INDOCIN) 50 MG capsule TAKE AS NEEDED  . metFORMIN (GLUCOPHAGE) 500 MG tablet Take 1,000 mg by mouth 2 (two) times daily with a meal.   No current facility-administered medications on file prior to visit.    Review of Systems  Constitutional: Negative for fever, chills, activity change, appetite change, fatigue and unexpected weight change.  HENT: Negative for hearing loss.   Eyes: Negative for visual disturbance.  Respiratory: Negative for cough, chest tightness, shortness of breath and wheezing.   Cardiovascular: Negative for chest pain, palpitations and leg swelling.  Gastrointestinal: Positive for  constipation (mild). Negative for nausea, vomiting, abdominal pain, diarrhea, blood in stool and abdominal distention.  Genitourinary: Negative for hematuria and difficulty urinating.  Musculoskeletal: Negative for myalgias, arthralgias and neck pain.  Skin: Negative for rash.  Neurological: Negative for dizziness, seizures, syncope and headaches.  Hematological: Negative for adenopathy. Does not bruise/bleed easily.  Psychiatric/Behavioral: Negative for dysphoric mood. The patient is not nervous/anxious.    Per HPI unless specifically indicated above     Objective:    BP 110/70 mmHg  Pulse 92  Temp(Src) 98 F (36.7 C) (Tympanic)  Ht  (1.88 m)  Wt 266 lb (120.657 kg)  BMI 34.14 kg/m2  SpO2 98%  Wt Readings from Last 3 Encounters:  06/23/15 266 lb (120.657 kg)  06/03/14 270 lb 8 oz (122.698 kg)  05/25/14 272 lb (123.378 kg)    Physical Exam  Constitutional: He is oriented to person, place, and time. He appears well-developed and well-nourished. No distress.  HENT:  Head: Normocephalic and atraumatic.  Right Ear: Hearing, tympanic membrane, external ear and ear canal normal.  Left Ear: Hearing, tympanic membrane, external ear and ear canal normal.  Nose: Nose normal.  Mouth/Throat: Uvula is midline, oropharynx is clear and moist and mucous membranes are normal. No oropharyngeal exudate, posterior oropharyngeal edema or posterior oropharyngeal erythema.  Eyes: Conjunctivae and EOM are normal. Pupils are equal, round, and reactive to light. No scleral icterus.  Neck: Normal range of motion. Neck supple.  Cardiovascular: Normal rate, regular rhythm, normal heart sounds and intact distal  pulses.   No murmur heard. Pulses:      Radial pulses are 2+ on the right side, and 2+ on the left side.  Pulmonary/Chest: Effort normal and breath sounds normal. No respiratory distress. He has no wheezes. He has no rales.  Abdominal: Soft. Bowel sounds are normal. He exhibits no distension  and no mass. There is no tenderness. There is no rebound and no guarding.  Musculoskeletal: Normal range of motion. He exhibits no edema.  Lymphadenopathy:    He has no cervical adenopathy.  Neurological: He is alert and oriented to person, place, and time.  CN grossly intact, station and gait intact  Skin: Skin is warm and dry. No rash noted.  Psychiatric: He has a normal mood and affect. His behavior is normal. Judgment and thought content normal.  Nursing note and vitals reviewed.  Results for orders placed or performed in visit on 06/23/15  HM DIABETES FOOT EXAM  Result Value Ref Range   HM Diabetic Foot Exam per endo       Assessment & Plan:   Problem List Items Addressed This Visit    Gout    Chronic, stable on allopurinol 300mg  daily - continue. No recent flare.      Essential hypertension    Chronic, stable. Continue losartan and amlodipine.  Pt will montor bp and notify me if low bp or low bp sxs.      Relevant Medications   amLODipine (NORVASC) 10 MG tablet   losartan (COZAAR) 100 MG tablet   OSA (obstructive sleep apnea)    Not using CPAP. Endorses restorative sleep.       Obesity, Class I, BMI 30-34.9    Discussed healthy diet and lifestyle changes to affect sustainable weight loss.      Diabetes type 2, controlled    Chronic, well controlled. Off insulin. F/u with Dr Tedd Sias.      Relevant Medications   losartan (COZAAR) 100 MG tablet   Health maintenance examination - Primary    Preventative protocols reviewed and updated unless pt declined. Discussed healthy diet and lifestyle.       Dyslipidemia    Reviewed chol levels - LDL at goal. Consider statin in future. Pt declines for now. Discussed diet changes to improve trig levels          Follow up plan: Return in about 1 year (around 06/22/2016), or as needed, for annual exam, prior fasting for blood work.

## 2015-06-23 NOTE — Assessment & Plan Note (Signed)
Not using CPAP. Endorses restorative sleep.

## 2015-06-23 NOTE — Assessment & Plan Note (Signed)
Preventative protocols reviewed and updated unless pt declined. Discussed healthy diet and lifestyle.  

## 2015-06-23 NOTE — Assessment & Plan Note (Signed)
Chronic, stable on allopurinol  daily - continue. No recent flare.

## 2015-07-10 ENCOUNTER — Ambulatory Visit (INDEPENDENT_AMBULATORY_CARE_PROVIDER_SITE_OTHER): Payer: BLUE CROSS/BLUE SHIELD | Admitting: Family Medicine

## 2015-07-10 ENCOUNTER — Encounter: Payer: Self-pay | Admitting: Family Medicine

## 2015-07-10 VITALS — BP 122/82 | HR 82 | Temp 98.2°F | Ht 74.0 in | Wt 263.2 lb

## 2015-07-10 DIAGNOSIS — S91209A Unspecified open wound of unspecified toe(s) with damage to nail, initial encounter: Secondary | ICD-10-CM

## 2015-07-10 DIAGNOSIS — E119 Type 2 diabetes mellitus without complications: Secondary | ICD-10-CM

## 2015-07-10 NOTE — Progress Notes (Signed)
Dr. Karleen Hampshire T. Elmo Rio, MD, CAQ [facility] [address] [place], [address] Phone: [phone] Fax: [phone]  07/10/2015  Patient: Kyle [name], MRN: [id number], DOB: Apr 25, 1976, 39 y.o.  Primary Physician:  Eustaquio Boyden, MD  Chief Complaint: Toe Injury  Subjective:   Traycen Goyer is a 39 y.o. very pleasant male patient who presents with the following:  Immunization History  Administered Date(s) Administered  . Hepatitis A 10/25/2008, 06/05/2009  . Influenza Split 07/29/2011, 10/08/2012  . Influenza Whole 08/26/2007  . Influenza,inj,Quad PF,36+ Mos 08/27/2013  . Pneumococcal Polysaccharide-23 05/25/2014  . Td 10/25/2008    Avulsed L 5th toenail on 07/08/2015  A lot of blood, feeling better today  Past Medical History, Surgical History, Social History, Family History, Problem List, Medications, and Allergies have been reviewed and updated if relevant.  Patient Active Problem List   Diagnosis Date Noted  . Health maintenance examination 05/25/2014  . Dyslipidemia 05/25/2014  . Diabetes type 2, controlled 08/27/2013  . Obesity, Class I, BMI 30-34.9 06/02/2013  . Gout 09/05/2009  . Essential hypertension 01/08/2008  . IBS 01/08/2008  . OSA (obstructive sleep apnea) 01/08/2008  . GERD 05/11/2007    Past Medical History  Diagnosis Date  . HTN (hypertension) 09/08/2008  . GERD 05/11/2007  . Gout, unspecified 09/05/2009  . IBS 01/08/2008  . SLEEP APNEA 01/08/2008    cannot tolerate CPAP  . THYROID STIMULATING HORMONE, ABNORMAL 10/10/2008  . Diabetes type 2, controlled     Dr Tedd Sias  . Dyslipidemia 05/25/2014    Past Surgical History  Procedure Laterality Date  . Hernia repair      Social History   Social History  . Marital Status: Married    Spouse Name: N/A  . Number of Children: N/A  . Years of Education: N/A   Occupational History  . Not on file.   Social History Main Topics  . Smoking status: Former  Smoker    Quit date: 10/29/2003  . Smokeless tobacco: Never Used  . Alcohol Use: Yes     Comment: Rarely  . Drug Use: No  . Sexual Activity: Not on file   Other Topics Concern  . 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 Stroke Other     bilateral grandfathers  . Hypertension Other     strong on maternal side  . Cancer Sister     cervical  . Cancer Father     prostate  . Thyroid disease Father   . CAD Mother 67  . CAD Father 47  . CAD Other     bilateral grandfathers  . Diabetes Neg Hx     Allergies  Allergen Reactions  . Penicillins     Medication list reviewed and updated in full in  Link.   GEN: No acute illnesses, no fevers, chills. GI: No n/v/d, eating normally Pulm: No SOB Interactive and getting along well at home.  Otherwise, ROS is as per the HPI.  Objective:   BP 122/82 mmHg  Pulse 82  Temp(Src) 98.2 F (36.8 C) (Oral)  Ht  (1.88 m)  Wt  263 lb 4 oz (119.409 kg)  BMI 33.78 kg/m2  GEN: WDWN, NAD, Non-toxic, A & O x 3 HEENT: Atraumatic, Normocephalic. Neck supple. No masses, No LAD. Ears and Nose: No external deformity. CV: RRR, No M/G/R. No JVD. No thrill. No extra heart sounds. PULM: CTA B, no wheezes, crackles, rhonchi. No retractions. No resp. distress. No accessory muscle use. EXTR: No c/c/e NEURO Normal gait.  PSYCH: Normally interactive. Conversant. Not depressed or anxious appearing.  Calm demeanor.  L 5th toenail completely avulsed, some healing, no scab  Laboratory and Imaging Data:  Assessment and Plan:   Toenail avulsion, initial encounter  Diabetes type 2, controlled  Looking ok Discussed wound care  If red or warm, call,  will send in abx  Signed,  Jamarie Mussa T. Ronella Plunk, MD   Patient's Medications  New Prescriptions   No medications on file  Previous Medications   ALLOPURINOL (ZYLOPRIM) 300 MG TABLET    Take 1 tablet (300 mg total) by mouth daily.   AMLODIPINE (NORVASC) 10 MG TABLET    Take 1 tablet (10 mg total) by mouth daily.   CANAGLIFLOZIN 300 MG TABS    Take 1 tablet (300 mg total) by mouth daily.   INDOMETHACIN (INDOCIN) 50 MG CAPSULE    TAKE AS NEEDED   LOSARTAN (COZAAR) 100 MG TABLET    Take 1 tablet (100 mg total) by mouth daily.   METFORMIN (GLUCOPHAGE) 500 MG TABLET    Take 1,000 mg by mouth 2 (two) times daily with a meal.  Modified Medications   No medications on file  Discontinued Medications   No medications on file

## 2015-07-10 NOTE — Progress Notes (Signed)
Pre visit review using our clinic review tool, if applicable. No additional management support is needed unless otherwise documented below in the visit note. 

## 2015-07-26 ENCOUNTER — Telehealth: Payer: Self-pay | Admitting: Family Medicine

## 2015-07-26 NOTE — Telephone Encounter (Signed)
Pt dropped off form needed for work. He needs form signed and returned by Friday so he can turn it in. Please call pt when ready to pick up. cb number is [phone] placing on Dr Timoteo Expose in box  thanks

## 2015-07-27 NOTE — Telephone Encounter (Signed)
Form faxed. Patient notified and copy mailed to patient as requested.

## 2015-07-27 NOTE — Telephone Encounter (Signed)
Filled and in Kim's box. 

## 2016-03-20 DIAGNOSIS — E119 Type 2 diabetes mellitus without complications: Secondary | ICD-10-CM | POA: Diagnosis not present

## 2016-03-27 DIAGNOSIS — E119 Type 2 diabetes mellitus without complications: Secondary | ICD-10-CM | POA: Diagnosis not present

## 2016-06-20 ENCOUNTER — Other Ambulatory Visit: Payer: Self-pay | Admitting: Family Medicine

## 2016-06-20 DIAGNOSIS — E669 Obesity, unspecified: Secondary | ICD-10-CM

## 2016-06-20 DIAGNOSIS — E119 Type 2 diabetes mellitus without complications: Secondary | ICD-10-CM

## 2016-06-20 DIAGNOSIS — M109 Gout, unspecified: Secondary | ICD-10-CM

## 2016-06-20 DIAGNOSIS — E785 Hyperlipidemia, unspecified: Secondary | ICD-10-CM

## 2016-06-20 DIAGNOSIS — I10 Essential (primary) hypertension: Secondary | ICD-10-CM

## 2016-06-21 ENCOUNTER — Other Ambulatory Visit (INDEPENDENT_AMBULATORY_CARE_PROVIDER_SITE_OTHER): Payer: BLUE CROSS/BLUE SHIELD

## 2016-06-21 DIAGNOSIS — E119 Type 2 diabetes mellitus without complications: Secondary | ICD-10-CM | POA: Diagnosis not present

## 2016-06-21 DIAGNOSIS — R7989 Other specified abnormal findings of blood chemistry: Secondary | ICD-10-CM | POA: Diagnosis not present

## 2016-06-21 DIAGNOSIS — E785 Hyperlipidemia, unspecified: Secondary | ICD-10-CM

## 2016-06-21 DIAGNOSIS — M109 Gout, unspecified: Secondary | ICD-10-CM | POA: Diagnosis not present

## 2016-06-21 DIAGNOSIS — E669 Obesity, unspecified: Secondary | ICD-10-CM | POA: Diagnosis not present

## 2016-06-21 DIAGNOSIS — I10 Essential (primary) hypertension: Secondary | ICD-10-CM | POA: Diagnosis not present

## 2016-06-21 LAB — LIPID PANEL
CHOLESTEROL: 188 mg/dL (ref 0–200)
HDL: 30.1 mg/dL — ABNORMAL LOW (ref >39.00)
Total CHOL/HDL Ratio: 6
Triglycerides: 519 mg/dL — ABNORMAL HIGH (ref 0.0–149.0)

## 2016-06-21 LAB — COMPREHENSIVE METABOLIC PANEL
ALBUMIN: 4.5 g/dL (ref 3.5–5.2)
ALK PHOS: 38 U/L — AB (ref 39–117)
ALT: 51 U/L (ref 0–53)
AST: 27 U/L (ref 0–37)
BILIRUBIN TOTAL: 0.4 mg/dL (ref 0.2–1.2)
BUN: 21 mg/dL (ref 6–23)
CO2: 26 mEq/L (ref 19–32)
Calcium: 9 mg/dL (ref 8.4–10.5)
Chloride: 104 mEq/L (ref 96–112)
Creatinine, Ser: 0.83 mg/dL (ref 0.40–1.50)
GFR: 108.86 mL/min (ref >60.00)
Glucose, Bld: 129 mg/dL — ABNORMAL HIGH (ref 70–99)
POTASSIUM: 4.1 meq/L (ref 3.5–5.1)
SODIUM: 137 meq/L (ref 135–145)
TOTAL PROTEIN: 7.3 g/dL (ref 6.0–8.3)

## 2016-06-21 LAB — LDL CHOLESTEROL, DIRECT: Direct LDL: 86 mg/dL

## 2016-06-21 LAB — HEMOGLOBIN A1C: HEMOGLOBIN A1C: 6.2 % (ref 4.6–6.5)

## 2016-06-21 LAB — URIC ACID: Uric Acid, Serum: 5.1 mg/dL (ref 4.0–7.8)

## 2016-06-27 ENCOUNTER — Encounter: Payer: Self-pay | Admitting: Family Medicine

## 2016-06-27 ENCOUNTER — Ambulatory Visit (INDEPENDENT_AMBULATORY_CARE_PROVIDER_SITE_OTHER): Payer: BLUE CROSS/BLUE SHIELD | Admitting: Family Medicine

## 2016-06-27 VITALS — BP 128/82 | HR 94 | Temp 98.2°F | Ht 73.75 in | Wt 274.5 lb

## 2016-06-27 DIAGNOSIS — E119 Type 2 diabetes mellitus without complications: Secondary | ICD-10-CM

## 2016-06-27 DIAGNOSIS — Z23 Encounter for immunization: Secondary | ICD-10-CM

## 2016-06-27 DIAGNOSIS — H0259 Other disorders affecting eyelid function: Secondary | ICD-10-CM | POA: Diagnosis not present

## 2016-06-27 DIAGNOSIS — M109 Gout, unspecified: Secondary | ICD-10-CM

## 2016-06-27 DIAGNOSIS — Z0000 Encounter for general adult medical examination without abnormal findings: Secondary | ICD-10-CM | POA: Diagnosis not present

## 2016-06-27 DIAGNOSIS — E669 Obesity, unspecified: Secondary | ICD-10-CM

## 2016-06-27 DIAGNOSIS — E785 Hyperlipidemia, unspecified: Secondary | ICD-10-CM

## 2016-06-27 DIAGNOSIS — I10 Essential (primary) hypertension: Secondary | ICD-10-CM | POA: Diagnosis not present

## 2016-06-27 MED ORDER — LOSARTAN POTASSIUM 100 MG PO TABS: 100.0000 mg | ORAL_TABLET | Freq: Every day | ORAL | 3 refills | Status: DC

## 2016-06-27 MED ORDER — ALLOPURINOL 300 MG PO TABS: 300.0000 mg | ORAL_TABLET | Freq: Every day | ORAL | 3 refills | Status: DC

## 2016-06-27 MED ORDER — AMLODIPINE BESYLATE 10 MG PO TABS: 10.0000 mg | ORAL_TABLET | Freq: Every day | ORAL | 3 refills | Status: DC

## 2016-06-27 NOTE — Addendum Note (Signed)
Addended by: Eustaquio BoydenGUTIERREZ, Shishir Krantz on: 06/27/2016 05:11 PM   Modules accepted: Orders

## 2016-06-27 NOTE — Patient Instructions (Addendum)
Flu shot today. Check with Dr Tedd SiasSolum about changing from canagliflozin to jardiance - coupon provided today. Return at your convenience fasting for recheck cholesterol level. You are doing well today, return as needed or in 1 year for next physical.  Health Maintenance, Male A healthy lifestyle and preventative care can promote health and wellness.  Maintain regular health, dental, and eye exams.  Eat a healthy diet. Foods like vegetables, fruits, whole grains, low-fat dairy products, and lean protein foods contain the nutrients you need and are low in calories. Decrease your intake of foods high in solid fats, added sugars, and salt. Get information about a proper diet from your health care provider, if necessary.  Regular physical exercise is one of the most important things you can do for your health. Most adults should get at least 150 minutes of moderate-intensity exercise (any activity that increases your heart rate and causes you to sweat) each week. In addition, most adults need muscle-strengthening exercises on 2 or more days a week.   Maintain a healthy weight. The body mass index (BMI) is a screening tool to identify possible weight problems. It provides an estimate of body fat based on height and weight. Your health care provider can find your BMI and can help you achieve or maintain a healthy weight. For males 20 years and older:  A BMI below 18.5 is considered underweight.  A BMI of 18.5 to 24.9 is normal.  A BMI of 25 to 29.9 is considered overweight.  A BMI of 30 and above is considered obese.  Maintain normal blood lipids and cholesterol by exercising and minimizing your intake of saturated fat. Eat a balanced diet with plenty of fruits and vegetables. Blood tests for lipids and cholesterol should begin at age 40 and be repeated every 5 years. If your lipid or cholesterol levels are high, you are over age 40, or you are at high risk for heart disease, you may need your  cholesterol levels checked more frequently.Ongoing high lipid and cholesterol levels should be treated with medicines if diet and exercise are not working.  If you smoke, find out from your health care provider how to quit. If you do not use tobacco, do not start.  Lung cancer screening is recommended for adults aged 55-80 years who are at high risk for developing lung cancer because of a history of smoking. A yearly low-dose CT scan of the lungs is recommended for people who have at least a 30-pack-year history of smoking and are current smokers or have quit within the past 15 years. A pack year of smoking is smoking an average of 1 pack of cigarettes a day for 1 year (for example, a 30-pack-year history of smoking could mean smoking 1 pack a day for 30 years or 2 packs a day for 15 years). Yearly screening should continue until the smoker has stopped smoking for at least 15 years. Yearly screening should be stopped for people who develop a health problem that would prevent them from having lung cancer treatment.  If you choose to drink alcohol, do not have more than 2 drinks per day. One drink is considered to be 12 oz (360 mL) of beer, 5 oz (150 mL) of wine, or 1.5 oz (45 mL) of liquor.  Avoid the use of street drugs. Do not share needles with anyone. Ask for help if you need support or instructions about stopping the use of drugs.  High blood pressure causes heart disease and increases the  risk of stroke. High blood pressure is more likely to develop in:  People who have blood pressure in the end of the normal range (100-139/85-89 mm Hg).  People who are overweight or obese.  People who are African American.  If you are 72-27 years of age, have your blood pressure checked every 3-5 years. If you are 90 years of age or older, have your blood pressure checked every year. You should have your blood pressure measured twice--once when you are at a hospital or clinic, and once when you are not at a  hospital or clinic. Record the average of the two measurements. To check your blood pressure when you are not at a hospital or clinic, you can use:  An automated blood pressure machine at a pharmacy.  A home blood pressure monitor.  If you are 71-52 years old, ask your health care provider if you should take aspirin to prevent heart disease.  Diabetes screening involves taking a blood sample to check your fasting blood sugar level. This should be done once every 3 years after age 7 if you are at a normal weight and without risk factors for diabetes. Testing should be considered at a younger age or be carried out more frequently if you are overweight and have at least 1 risk factor for diabetes.  Colorectal cancer can be detected and often prevented. Most routine colorectal cancer screening begins at the age of 60 and continues through age 72. However, your health care provider may recommend screening at an earlier age if you have risk factors for colon cancer. On a yearly basis, your health care provider may provide home test kits to check for hidden blood in the stool. A small camera at the end of a tube may be used to directly examine the colon (sigmoidoscopy or colonoscopy) to detect the earliest forms of colorectal cancer. Talk to your health care provider about this at age 46 when routine screening begins. A direct exam of the colon should be repeated every 5-10 years through age 65, unless early forms of precancerous polyps or small growths are found.  People who are at an increased risk for hepatitis B should be screened for this virus. You are considered at high risk for hepatitis B if:  You were born in a country where hepatitis B occurs often. Talk with your health care provider about which countries are considered high risk.  Your parents were born in a high-risk country and you have not received a shot to protect against hepatitis B (hepatitis B vaccine).  You have HIV or AIDS.  You  use needles to inject street drugs.  You live with, or have sex with, someone who has hepatitis B.  You are a man who has sex with other men (MSM).  You get hemodialysis treatment.  You take certain medicines for conditions like cancer, organ transplantation, and autoimmune conditions.  Hepatitis C blood testing is recommended for all people born from 18 through 1965 and any individual with known risk factors for hepatitis C.  Healthy men should no longer receive prostate-specific antigen (PSA) blood tests as part of routine cancer screening. Talk to your health care provider about prostate cancer screening.  Testicular cancer screening is not recommended for adolescents or adult males who have no symptoms. Screening includes self-exam, a health care provider exam, and other screening tests. Consult with your health care provider about any symptoms you have or any concerns you have about testicular cancer.  Practice safe  sex. Use condoms and avoid high-risk sexual practices to reduce the spread of sexually transmitted infections (STIs).  You should be screened for STIs, including gonorrhea and chlamydia if:  You are sexually active and are younger than 24 years.  You are older than 24 years, and your health care provider tells you that you are at risk for this type of infection.  Your sexual activity has changed since you were last screened, and you are at an increased risk for chlamydia or gonorrhea. Ask your health care provider if you are at risk.  If you are at risk of being infected with HIV, it is recommended that you take a prescription medicine daily to prevent HIV infection. This is called pre-exposure prophylaxis (PrEP). You are considered at risk if:  You are a man who has sex with other men (MSM).  You are a heterosexual man who is sexually active with multiple partners.  You take drugs by injection.  You are sexually active with a partner who has HIV.  Talk with  your health care provider about whether you are at high risk of being infected with HIV. If you choose to begin PrEP, you should first be tested for HIV. You should then be tested every 3 months for as long as you are taking PrEP.  Use sunscreen. Apply sunscreen liberally and repeatedly throughout the day. You should seek shade when your shadow is shorter than you. Protect yourself by wearing long sleeves, pants, a wide-brimmed hat, and sunglasses year round whenever you are outdoors.  Tell your health care provider of new moles or changes in moles, especially if there is a change in shape or color. Also, tell your health care provider if a mole is larger than the size of a pencil eraser.  A one-time screening for abdominal aortic aneurysm (AAA) and surgical repair of large AAAs by ultrasound is recommended for men aged 35-75 years who are current or former smokers.  Stay current with your vaccines (immunizations).   This information is not intended to replace advice given to you by your health care provider. Make sure you discuss any questions you have with your health care provider.   Document Released: 04/11/2008 Document Revised: 11/04/2014 Document Reviewed: 03/11/2011 Elsevier Interactive Patient Education [employer].

## 2016-06-27 NOTE — Assessment & Plan Note (Signed)
Chronic, stable on allopurinol. Consider decreased dose next visit.

## 2016-06-27 NOTE — Assessment & Plan Note (Signed)
Marked elevated triglylcerides. Unclear if dietary related from recent fatty meal. Will recheck next week. If persistently high, will discuss fibrate vs statin.

## 2016-06-27 NOTE — Assessment & Plan Note (Signed)
Preventative protocols reviewed and updated unless pt declined. Discussed healthy diet and lifestyle.  

## 2016-06-27 NOTE — Assessment & Plan Note (Signed)
Chronic, stable. Continue current regimen. 

## 2016-06-27 NOTE — Progress Notes (Signed)
Pre visit review using our clinic review tool, if applicable. No additional management support is needed unless otherwise documented below in the visit note. 

## 2016-06-27 NOTE — Assessment & Plan Note (Signed)
Chronic, well controlled. Appreciate endo care Dr Tedd SiasSolum.  Suggested check with Dr Tedd SiasSolum about change from invokana to jardiance (coupon provided today).

## 2016-06-27 NOTE — Assessment & Plan Note (Addendum)
Followed by ophtho.

## 2016-06-27 NOTE — Assessment & Plan Note (Signed)
Discussed healthy diet and lifestyle changes to affect sustainable weight loss  

## 2016-06-27 NOTE — Progress Notes (Addendum)
BP 128/82 (BP Location: Left Arm, Patient Position: Sitting, Cuff Size: Large)   Pulse 94   Temp 98.2 F (36.8 C) (Oral)   Ht 6' 1.75" (1.873 m)   Wt 274 lb 8 oz (124.5 kg)   SpO2 95%   BMI 35.48 kg/m    CC: CPE Subjective:    Patient ID: Kyle [name], male    DOB: [birth year], 40 y.o.   MRN: [id number]  HPI: Kyle [name] is a 40 y.o. male presenting on 06/27/2016 for Annual Exam   DM - followed by Dr Tedd Sias. Last A1c 5.9%. On metformin and canagliflozin.  Hypertriglyceridemia - unsure what he ate the night before. Will retest.  Gout - no recent gout flares on allopurinol 300mg  daily.  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   Preventative:  Flu shot today Td 2009 Pneumovax 2015. Seat belt use discussed Sunscreen use discussed. No changing moles.  Non smoker Alcohol - rare  Caffeine: 4 cups/day Lives with wife and 2 children (adopted from [place]) Occupation: Production designer, theatre/television/film at [employer] Activity: 4x/wk calisthetics  Diet: good water daily, fruits/vegetables daily   Relevant past medical, surgical, family and social history reviewed and updated as indicated. Interim medical history since our last visit reviewed. Allergies and medications reviewed and updated. Current Outpatient Prescriptions on File Prior to Visit  Medication Sig  . Canagliflozin 300 MG TABS Take 1 tablet (300 mg total) by mouth daily.  . metFORMIN (GLUCOPHAGE) 500 MG tablet Take 500 mg by mouth 2 (two) times daily with a meal.    No current facility-administered medications on file prior to visit.     Review of Systems  Constitutional: Negative for activity change, appetite change, chills, fatigue, fever and unexpected weight change.  HENT: Negative for hearing loss.   Eyes: Negative for visual disturbance.  Respiratory: Positive for cough (mild dry cough at night). Negative for chest tightness, shortness of breath and wheezing.   Cardiovascular: Negative for chest pain,  palpitations and leg swelling.  Gastrointestinal: Negative for abdominal distention, abdominal pain, blood in stool, constipation, diarrhea, nausea and vomiting.  Genitourinary: Negative for difficulty urinating and hematuria.  Musculoskeletal: Negative for arthralgias, myalgias and neck pain.  Skin: Negative for rash.  Neurological: Negative for dizziness, seizures, syncope and headaches.  Hematological: Negative for adenopathy. Does not bruise/bleed easily.  Psychiatric/Behavioral: Negative for dysphoric mood. The patient is not nervous/anxious.    Per HPI unless specifically indicated in ROS section     Objective:    BP 128/82 (BP Location: Left Arm, Patient Position: Sitting, Cuff Size: Large)   Pulse 94   Temp 98.2 F (36.8 C) (Oral)   Ht 6' 1.75" (1.873 m)   Wt 274 lb 8 oz (124.5 kg)   SpO2 95%   BMI 35.48 kg/m   Wt Readings from Last 3 Encounters:  06/27/16 274 lb 8 oz (124.5 kg)  07/10/15 263 lb 4 oz (119.4 kg)  06/23/15 266 lb (120.7 kg)    Physical Exam  Constitutional: He is oriented to person, place, and time. He appears well-developed and well-nourished. No distress.  HENT:  Head: Normocephalic and atraumatic.  Right Ear: Hearing, tympanic membrane, external ear and ear canal normal.  Left Ear: Hearing, tympanic membrane, external ear and ear canal normal.  Nose: Nose normal.  Mouth/Throat: Uvula is midline, oropharynx is clear and moist and mucous membranes are normal. No oropharyngeal exudate, posterior oropharyngeal edema or posterior oropharyngeal erythema.  Eyes: Conjunctivae and EOM are  normal. Pupils are equal, round, and reactive to light. No scleral icterus.  Neck: Normal range of motion. Neck supple. No thyromegaly present.  Cardiovascular: Normal rate, regular rhythm, normal heart sounds and intact distal pulses.   No murmur heard. Pulses:      Radial pulses are 2+ on the right side, and 2+ on the left side.  Pulmonary/Chest: Effort normal and breath  sounds normal. No respiratory distress. He has no wheezes. He has no rales.  Abdominal: Soft. Bowel sounds are normal. He exhibits no distension and no mass. There is no tenderness. There is no rebound and no guarding.  Musculoskeletal: Normal range of motion. He exhibits no edema.  Lymphadenopathy:    He has no cervical adenopathy.  Neurological: He is alert and oriented to person, place, and time.  CN grossly intact, station and gait intact  Skin: Skin is warm and dry. No rash noted.  Psychiatric: He has a normal mood and affect. His behavior is normal. Judgment and thought content normal.  Nursing note and vitals reviewed.  Results for orders placed or performed in visit on 06/21/16  Lipid panel  Result Value Ref Range   Cholesterol 188 0 - 200 mg/dL   Triglycerides (H) 0.0 - 149.0 mg/dL    [id number] Triglyceride is over 400; calculations on Lipids are invalid.   HDL 30.10 (L) >39.00 mg/dL   Total CHOL/HDL Ratio 6   Comprehensive metabolic panel  Result Value Ref Range   Sodium 137 135 - 145 mEq/L   Potassium 4.1 3.5 - 5.1 mEq/L   Chloride 104 96 - 112 mEq/L   CO2 26 19 - 32 mEq/L   Glucose, Bld 129 (H) 70 - 99 mg/dL   BUN 21 6 - 23 mg/dL   Creatinine, Ser 5.400.83 0.40 - 1.50 mg/dL   Total Bilirubin 0.4 0.2 - 1.2 mg/dL   Alkaline Phosphatase 38 (L) 39 - 117 U/L   AST 27 0 - 37 U/L   ALT 51 0 - 53 U/L   Total Protein 7.3 6.0 - 8.3 g/dL   Albumin 4.5 3.5 - 5.2 g/dL   Calcium 9.0 8.4 - 98.110.5 mg/dL   GFR 191.47108.86 >82.95>60.00 mL/min  Hemoglobin A1c  Result Value Ref Range   Hgb A1c MFr Bld 6.2 4.6 - 6.5 %  Uric acid  Result Value Ref Range   Uric Acid, Serum 5.1 4.0 - 7.8 mg/dL  LDL cholesterol, direct  Result Value Ref Range   Direct LDL 86.0 mg/dL      Assessment & Plan:   Problem List Items Addressed This Visit    Diabetes type 2, controlled (HCC)    Chronic, well controlled. Appreciate endo care Dr Tedd SiasSolum.  Suggested check with Dr Tedd SiasSolum about change from invokana to jardiance  (coupon provided today).       Relevant Medications   losartan (COZAAR) 100 MG tablet   Dyslipidemia    Marked elevated triglylcerides. Unclear if dietary related from recent fatty meal. Will recheck next week. If persistently high, will discuss fibrate vs statin.      Relevant Orders   Lipid panel   TSH   Essential hypertension    Chronic, stable. Continue current regimen.       Relevant Medications   losartan (COZAAR) 100 MG tablet   amLODipine (NORVASC) 10 MG tablet   Floppy eyelid syndrome    Followed by ophtho.      Gout    Chronic, stable on allopurinol. Consider decreased dose next visit.  Health maintenance examination - Primary    Preventative protocols reviewed and updated unless pt declined. Discussed healthy diet and lifestyle.       Obesity, Class I, BMI 30-34.9    Discussed healthy diet and lifestyle changes to affect sustainable weight loss.       Other Visit Diagnoses   None.      Follow up plan: Return in about 1 year (around 06/27/2017), or as needed, for annual exam, prior fasting for blood work.  Eustaquio Boyden, MD

## 2016-06-28 DIAGNOSIS — Z23 Encounter for immunization: Secondary | ICD-10-CM | POA: Diagnosis not present

## 2016-06-28 NOTE — Addendum Note (Signed)
Addended by: Shon MilletWATLINGTON, Evelen Vazguez M on: 06/28/2016 08:09 AM   Modules accepted: Orders

## 2016-06-28 NOTE — Addendum Note (Signed)
Addended by: Shon MilletWATLINGTON, Hokulani Rogel M on: 06/28/2016 08:04 AM   Modules accepted: Kipp BroodSmartSet

## 2016-07-11 ENCOUNTER — Other Ambulatory Visit (INDEPENDENT_AMBULATORY_CARE_PROVIDER_SITE_OTHER): Payer: BLUE CROSS/BLUE SHIELD

## 2016-07-11 DIAGNOSIS — E785 Hyperlipidemia, unspecified: Secondary | ICD-10-CM | POA: Diagnosis not present

## 2016-07-11 LAB — LIPID PANEL
CHOLESTEROL: 156 mg/dL (ref 0–200)
HDL: 28 mg/dL — ABNORMAL LOW (ref >39.00)
NONHDL: 127.73
TRIGLYCERIDES: 260 mg/dL — AB (ref 0.0–149.0)
Total CHOL/HDL Ratio: 6
VLDL: 52 mg/dL — ABNORMAL HIGH (ref 0.0–40.0)

## 2016-07-11 LAB — TSH: TSH: 1.99 u[IU]/mL (ref 0.35–4.50)

## 2016-07-11 LAB — LDL CHOLESTEROL, DIRECT: LDL DIRECT: 88 mg/dL

## 2016-07-15 ENCOUNTER — Encounter: Payer: Self-pay | Admitting: *Deleted

## 2016-08-06 ENCOUNTER — Other Ambulatory Visit: Payer: Self-pay | Admitting: Family Medicine

## 2017-02-01 ENCOUNTER — Ambulatory Visit (INDEPENDENT_AMBULATORY_CARE_PROVIDER_SITE_OTHER): Payer: BLUE CROSS/BLUE SHIELD

## 2017-02-01 ENCOUNTER — Encounter (HOSPITAL_COMMUNITY): Payer: Self-pay | Admitting: Emergency Medicine

## 2017-02-01 ENCOUNTER — Ambulatory Visit (HOSPITAL_COMMUNITY)
Admission: EM | Admit: 2017-02-01 | Discharge: 2017-02-01 | Disposition: A | Discharge status: Home / Self Care | Payer: BLUE CROSS/BLUE SHIELD | Attending: Internal Medicine | Admitting: Internal Medicine

## 2017-02-01 DIAGNOSIS — J019 Acute sinusitis, unspecified: Secondary | ICD-10-CM | POA: Diagnosis not present

## 2017-02-01 DIAGNOSIS — R05 Cough: Secondary | ICD-10-CM | POA: Diagnosis not present

## 2017-02-01 MED ORDER — AMOXICILLIN-POT CLAVULANATE 875-125 MG PO TABS: 1.0000 | ORAL_TABLET | Freq: Two times a day (BID) | ORAL | 0 refills | Status: AC

## 2017-02-01 NOTE — ED Provider Notes (Signed)
CSN: [id number]     Arrival date & time 02/01/17  1208 History   First MD Initiated Contact with Patient 02/01/17 1313     Chief Complaint  Patient presents with  . URI   (Consider location/radiation/quality/duration/timing/severity/associated sxs/prior Treatment) Patient presents today for 2-week duration of cold symptoms. He started to feel better after 4 days but then immediately got sick again while on the cruise ship. He just return from cruise to [place] yesterday. Patient reports his symtpoms are fever, coughing, fatigue, chills, SOB, wheezing, headache and congestion. He also has CP but only during cough. He denies sinus pain/pressure. He saw a provider on the cruise ship and had a negative chest xray and was given cough medicine and an antihistamine with no relief.          Past Medical History:  Diagnosis Date  . Diabetes type 2, controlled (HCC)    Dr Tedd Sias  . Dyslipidemia 05/25/2014  . GERD 05/11/2007  . Gout, unspecified 09/05/2009  . HTN (hypertension) 09/08/2008  . IBS 01/08/2008  . SLEEP APNEA 01/08/2008   cannot tolerate CPAP  . THYROID STIMULATING HORMONE, ABNORMAL 10/10/2008   Past Surgical History:  Procedure Laterality Date  . HERNIA REPAIR     Family History  Problem Relation Age of Onset  . Stroke Other     bilateral grandfathers  . Hypertension Other     strong on maternal side  . Cancer Sister     cervical  . Cancer Father     prostate  . Thyroid disease Father   . CAD Father 50  . CAD Mother 15  . CAD Other     bilateral grandfathers  . Diabetes Neg Hx    Social History  Substance Use Topics  . Smoking status: Former Smoker    Quit date: 10/29/2003  . Smokeless tobacco: Never Used  . Alcohol use Yes     Comment: Rarely    Review of Systems  Constitutional: Positive for chills, fatigue and fever.  HENT: Positive for congestion and rhinorrhea. Negative for ear pain, sinus pain, sinus pressure and sore throat.   Respiratory: Positive for  cough, shortness of breath and wheezing.   Cardiovascular: Positive for chest pain. Negative for palpitations.       Reports CP is from coughing   Gastrointestinal: Positive for nausea. Negative for abdominal pain and vomiting.  Musculoskeletal: Negative for myalgias.  Skin: Negative for rash.  Neurological: Positive for dizziness and headaches.    Allergies  Penicillins  Home Medications   Prior to Admission medications   Medication Sig Start Date End Date Taking? Authorizing Provider  allopurinol (ZYLOPRIM) 300 MG tablet Take 1 tablet (300 mg total) by mouth daily. 06/27/16   Eustaquio Boyden, MD  amLODipine (NORVASC) 10 MG tablet Take 1 tablet (10 mg total) by mouth daily. 06/27/16   Eustaquio Boyden, MD  amoxicillin-clavulanate (AUGMENTIN) 875-125 MG tablet Take 1 tablet by mouth 2 (two) times daily. 02/01/17 02/08/17  Lucia Estelle, NP  Canagliflozin 300 MG TABS Take 1 tablet (300 mg total) by mouth daily. 09/20/13   Gordy Savers, MD  losartan (COZAAR) 100 MG tablet Take 1 tablet (100 mg total) by mouth daily. 06/27/16   Eustaquio Boyden, MD  metFORMIN (GLUCOPHAGE) 500 MG tablet Take 500 mg by mouth 2 (two) times daily with a meal.     Historical Provider, MD   Meds Ordered and Administered this Visit  Medications - No data to display  BP (!) 158/106 (BP  Location: Left Arm)   Pulse (!) 102   Temp 99.1 F (37.3 C) (Oral)   Resp (!) 22   SpO2 98%  No data found.   Physical Exam  Constitutional: He is oriented to person, place, and time. He appears well-developed and well-nourished.  HENT:  Head: Normocephalic and atraumatic.  Right Ear: External ear normal.  Left Ear: External ear normal.  Nose: Nose normal.  Mouth/Throat: Oropharynx is clear and moist. No oropharyngeal exudate.  TM pearly gray with no erythema  Eyes: Conjunctivae and EOM are normal. Pupils are equal, round, and reactive to light.  Neck: Normal range of motion. Neck supple.  Cardiovascular: Regular  rhythm and normal heart sounds.   Slightly tachycardiac  Pulmonary/Chest: Effort normal. No respiratory distress. He has no wheezes.  Mild tachypnea  Abdominal: Soft. Bowel sounds are normal. He exhibits no distension. There is no tenderness.  Neurological: He is alert and oriented to person, place, and time.  Skin: Skin is warm and dry.  Nursing note and vitals reviewed.   Urgent Care Course     Procedures (including critical care time)  Labs Review Labs Reviewed - No data to display  Imaging Review Dg Chest 2 View  Result Date: 02/01/2017 CLINICAL DATA:  41 year old male with a history of cough for 2 weeks EXAM: CHEST  2 VIEW COMPARISON:  08/29/2007 FINDINGS: The heart size and mediastinal contours are within normal limits. Both lungs are clear. The visualized skeletal structures are unremarkable. IMPRESSION: No radiographic evidence of acute cardiopulmonary disease Electronically Signed   By: Gilmer Mor D.O.   On: 02/01/2017 13:39   MDM   1. Acute non-recurrent sinusitis, unspecified location    41 yr old came in today for 2-weeks of cold symptoms.  He was getting better but then got worst again. Was initially concern for pneumonia but chest xray was negative.   Symptoms could be viral but will treat empirically today for sinusitis with Augmentin BID x 7 days. Patient is allergic to Penicillin but reports able to take Amoxicillin.   Informed to follow up with PCP for no improvement or for worsening in symptoms.      Lucia Estelle, NP 02/01/17 1400

## 2017-02-01 NOTE — Discharge Instructions (Signed)
Your chest x-ray today is negative. Your symptoms could be viral but I would like to treat empirically for sinusitis. Please take the antibiotic as prescribed. Please follow up with your primary care provider if you get worst or does not improve.

## 2017-02-01 NOTE — ED Triage Notes (Signed)
Symptoms for 2 weeks.  Symptoms improved, then worsened.  Patient saw the cruise ship doctor on Tuesday and did get a chest xray and was told nothing showed on chest xray.  Cough, worse at night.  Low grade fever, sweating episodes, sinus congestion.  Cough med with codeine and an antihistamine and nebulizer from doctor on cruise.  Patient is feeling worse

## 2017-03-30 ENCOUNTER — Ambulatory Visit (HOSPITAL_COMMUNITY)
Admission: EM | Admit: 2017-03-30 | Discharge: 2017-03-30 | Disposition: A | Discharge status: Home / Self Care | Payer: BLUE CROSS/BLUE SHIELD | Attending: Internal Medicine | Admitting: Internal Medicine

## 2017-03-30 ENCOUNTER — Ambulatory Visit (INDEPENDENT_AMBULATORY_CARE_PROVIDER_SITE_OTHER): Payer: BLUE CROSS/BLUE SHIELD

## 2017-03-30 ENCOUNTER — Encounter (HOSPITAL_COMMUNITY): Payer: Self-pay | Admitting: Emergency Medicine

## 2017-03-30 DIAGNOSIS — R1012 Left upper quadrant pain: Secondary | ICD-10-CM | POA: Diagnosis not present

## 2017-03-30 DIAGNOSIS — R1032 Left lower quadrant pain: Secondary | ICD-10-CM | POA: Diagnosis not present

## 2017-03-30 LAB — POCT URINALYSIS DIP (DEVICE)
Bilirubin Urine: NEGATIVE
Glucose, UA: NEGATIVE mg/dL
Ketones, ur: NEGATIVE mg/dL
LEUKOCYTES UA: NEGATIVE
NITRITE: NEGATIVE
Protein, ur: NEGATIVE mg/dL
Specific Gravity, Urine: <=1.005 (ref 1.005–1.030)
UROBILINOGEN UA: 0.2 mg/dL (ref 0.0–1.0)
pH: 6 (ref 5.0–8.0)

## 2017-03-30 MED ORDER — HYDROCODONE-ACETAMINOPHEN 5-325 MG PO TABS: 1.0000 | ORAL_TABLET | Freq: Four times a day (QID) | ORAL | 0 refills | Status: DC | PRN

## 2017-03-30 MED ORDER — CIPROFLOXACIN HCL 500 MG PO TABS: 500.0000 mg | ORAL_TABLET | Freq: Two times a day (BID) | ORAL | 0 refills | Status: AC

## 2017-03-30 MED ORDER — METRONIDAZOLE 500 MG PO TABS: 500.0000 mg | ORAL_TABLET | Freq: Two times a day (BID) | ORAL | 0 refills | Status: DC

## 2017-03-30 NOTE — ED Triage Notes (Signed)
Abdominal pain started during the night, "uncomfortable" "hard to sleep".  Pain has never gone away since onset.  Patient did take mom this morning and had a bowel movement, but pain has been unchanged since onset.  Pain in left flank, left back and left lower abdomen.  No nausea, no vomiting

## 2017-03-30 NOTE — Discharge Instructions (Addendum)
Possible causes of left lower quadrant pain include kidney stone and diverticulitis/colitis.  Tenderness on exam is more suggestive of something like diverticulitis.  No immediate danger signs on exam today.  Prescriptions for antibiotics (metronidazole, cipro) were printed, and a prescription for a small number of vicodin were printed also.  Push fluids.  Rest.  Followup with Dr Sharen HonesGutierrez or recheck if not improving in a couple days.  Go to ED for severe/sustained pain.

## 2017-03-30 NOTE — ED Notes (Signed)
Provided ice pack

## 2017-03-30 NOTE — ED Provider Notes (Signed)
[facility]    CSN: [id number] Arrival date & time: 03/30/17  1514     History   Chief Complaint Chief Complaint  Patient presents with  . Abdominal Pain    HPI Kyle [name] is a 41 y.o. male. Yesterday after eating, he had the onset of significant left flank and abdominal discomfort, kind of waxed and waned, at times very intense. Chills a couple of times. Woke up in the night with it. Felt like it would be helpful to have a bowel movement, but could not accomplish this until this morning after taking milk of magnesia. No fever. No nausea/vomiting. No hematuria. Has been pushing fluids today. No personal history of kidney stone, although he thinks his mother has had one. Taking meds for blood pressure, gout, diabetes.  Thinks he had a colonoscopy maybe 10 years ago, does not recall diverticular disease (although thinks mother has this), dx for sx's at that time was IBS.    HPI  Past Medical History:  Diagnosis Date  . Diabetes type 2, controlled (HCC)    Dr Tedd Sias  . Dyslipidemia 05/25/2014  . GERD 05/11/2007  . Gout, unspecified 09/05/2009  . HTN (hypertension) 09/08/2008  . IBS 01/08/2008  . SLEEP APNEA 01/08/2008   cannot tolerate CPAP  . THYROID STIMULATING HORMONE, ABNORMAL 10/10/2008    Patient Active Problem List   Diagnosis Date Noted  . 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 Health maintenance examination 05/25/2014  . Dyslipidemia 05/25/2014  . Diabetes type 2, controlled (HCC) 08/27/2013  . Obesity, Class I, BMI 30-34.9 06/02/2013  . Gout 09/05/2009  . Essential hypertension 01/08/2008  . IBS 01/08/2008  . OSA (obstructive sleep apnea) 01/08/2008  . GERD 05/11/2007    Past Surgical History:  Procedure Laterality Date  . HERNIA REPAIR         Home Medications    Prior to Admission medications   Medication Sig Start Date End Date Taking? Authorizing Provider  allopurinol (ZYLOPRIM) 300 MG tablet Take 1 tablet (300 mg total) by mouth daily.  06/27/16   Eustaquio Boyden, MD  amLODipine (NORVASC) 10 MG tablet Take 1 tablet (10 mg total) by mouth daily. 06/27/16   Eustaquio Boyden, MD  Canagliflozin 300 MG TABS Take 1 tablet (300 mg total) by mouth daily. 09/20/13   Gordy Savers, MD  ciprofloxacin (CIPRO) 500 MG tablet Take 1 tablet (500 mg total) by mouth 2 (two) times daily. 03/30/17 04/06/17  Eustace Moore, MD  HYDROcodone-acetaminophen (NORCO/VICODIN) 5-325 MG tablet Take 1 tablet by mouth 4 (four) times daily as needed. 03/30/17   Eustace Moore, MD  losartan (COZAAR) 100 MG tablet Take 1 tablet (100 mg total) by mouth daily. 06/27/16   Eustaquio Boyden, MD  metFORMIN (GLUCOPHAGE) 500 MG tablet Take 500 mg by mouth 2 (two) times daily with a meal.     [provider]  metroNIDAZOLE (FLAGYL) 500 MG tablet Take 1 tablet (500 mg total) by mouth 2 (two) times daily. 03/30/17   Eustace Moore, MD    Family History Family History  Problem Relation Age of Onset  . Stroke Other        bilateral grandfathers  . Hypertension Other        strong on maternal side  . Cancer Sister        cervical  . Cancer Father        prostate  . Thyroid disease Father   . CAD Father 63  . CAD Mother  60  . CAD Other        bilateral grandfathers  . Diabetes Neg Hx     Social History Social History  Substance Use Topics  . Smoking status: Former Smoker    Quit date: 10/29/2003  . Smokeless tobacco: Never Used  . Alcohol use Yes     Comment: Rarely     Allergies   Penicillins   Review of Systems Review of Systems  All other systems reviewed and are negative.    Physical Exam Triage Vital Signs ED Triage Vitals  Enc Vitals Group     BP 03/30/17 1603 (!) 156/90     Pulse Rate 03/30/17 1603 (!) 106     Resp 03/30/17 1603 18     Temp 03/30/17 1603 98.2 F (36.8 C)     Temp Source 03/30/17 1603 Oral     SpO2 03/30/17 1603 96 %     Weight --      Height --      Pain Score 03/30/17 1601 3     Pain Loc --     Updated Vital Signs BP (!) 156/90 (BP Location: Right Arm)   Pulse (!) 106   Temp 98.2 F (36.8 C) (Oral)   Resp 18   SpO2 96%   Physical Exam  Constitutional: He is oriented to person, place, and time.  Alert, nicely groomed Pacing in the exam room, says it's hard to get comfortable  HENT:  Head: Atraumatic.  Eyes:  Conjugate gaze, no eye redness/drainage  Neck: Neck supple.  Cardiovascular: Normal rate and regular rhythm.   Pulmonary/Chest: No respiratory distress. He has no wheezes. He has no rales.  Lungs clear, symmetric breath sounds  Abdominal: Soft. He exhibits no distension. There is no rebound and no guarding.  Reproducible focal tenderness in the left lower quadrant Abdominal pain is worse when changing position, lying back/sitting up  Musculoskeletal: Normal range of motion.  Neurological: He is alert and oriented to person, place, and time.  Skin: Skin is warm and dry.  No cyanosis  Nursing note and vitals reviewed.    UC Treatments / Results  Labs (all labs ordered are listed, but only abnormal results are displayed) Labs Reviewed  POCT URINALYSIS DIP (DEVICE) - Abnormal; Notable for the following:       Result Value   Hgb urine dipstick SMALL (*)    All other components within normal limits    Procedures Procedures (including critical care time)  Radiology EXAM: DG ABDOMEN ACUTE W/ 1V CHEST  COMPARISON: Chest radiograph 02/01/2017  FINDINGS: Stable cardiac and mediastinal contours. No consolidative pulmonary opacities. No pleural effusion or pneumothorax.  Gas is demonstrated within nondilated loops of large and small bowel in a nonobstructed pattern. No free intraperitoneal air. Osseous structures are unremarkable.  IMPRESSION: No acute cardiopulmonary process.  Nonobstructed bowel gas pattern.   Electronically Signed By: Annia Beltrew Davis M.D. On: 03/30/2017 17:31  Final Clinical Impressions(s) / UC Diagnoses   Final diagnoses:   Abdominal pain, left lower quadrant   Possible causes of left lower quadrant pain include kidney stone and diverticulitis/colitis.  Tenderness on exam is more suggestive of something like diverticulitis.  No immediate danger signs on exam today.  Prescriptions for antibiotics (metronidazole, cipro) were printed, and a prescription for a small number of vicodin were printed also.  Push fluids.  Rest.  Followup with Dr Sharen HonesGutierrez or recheck if not improving in a couple days.  Go to ED for severe/sustained pain.  New Prescriptions Discharge Medication List as of 03/30/2017  5:58 PM    START taking these medications   Details  ciprofloxacin (CIPRO) 500 MG tablet Take 1 tablet (500 mg total) by mouth 2 (two) times daily., Starting Sun 03/30/2017, Until Sun 04/06/2017, Print    HYDROcodone-acetaminophen (NORCO/VICODIN) 5-325 MG tablet Take 1 tablet by mouth 4 (four) times daily as needed., Starting Sun 03/30/2017, Normal    metroNIDAZOLE (FLAGYL) 500 MG tablet Take 1 tablet (500 mg total) by mouth 2 (two) times daily., Starting Sun 03/30/2017, Print         Eustace Moore, MD 03/31/17 1025

## 2017-04-07 DIAGNOSIS — E119 Type 2 diabetes mellitus without complications: Secondary | ICD-10-CM | POA: Diagnosis not present

## 2017-06-22 ENCOUNTER — Other Ambulatory Visit: Payer: Self-pay | Admitting: Family Medicine

## 2017-06-22 DIAGNOSIS — E785 Hyperlipidemia, unspecified: Secondary | ICD-10-CM

## 2017-06-22 DIAGNOSIS — E119 Type 2 diabetes mellitus without complications: Secondary | ICD-10-CM

## 2017-06-22 DIAGNOSIS — M1A9XX0 Chronic gout, unspecified, without tophus (tophi): Secondary | ICD-10-CM

## 2017-06-26 ENCOUNTER — Other Ambulatory Visit (INDEPENDENT_AMBULATORY_CARE_PROVIDER_SITE_OTHER): Payer: BLUE CROSS/BLUE SHIELD

## 2017-06-26 DIAGNOSIS — M1A9XX0 Chronic gout, unspecified, without tophus (tophi): Secondary | ICD-10-CM | POA: Diagnosis not present

## 2017-06-26 DIAGNOSIS — E119 Type 2 diabetes mellitus without complications: Secondary | ICD-10-CM

## 2017-06-26 DIAGNOSIS — E785 Hyperlipidemia, unspecified: Secondary | ICD-10-CM | POA: Diagnosis not present

## 2017-06-26 LAB — BASIC METABOLIC PANEL
BUN: 17 mg/dL (ref 6–23)
CHLORIDE: 104 meq/L (ref 96–112)
CO2: 30 mEq/L (ref 19–32)
Calcium: 9.6 mg/dL (ref 8.4–10.5)
Creatinine, Ser: 0.81 mg/dL (ref 0.40–1.50)
GFR: 111.4 mL/min (ref >60.00)
Glucose, Bld: 133 mg/dL — ABNORMAL HIGH (ref 70–99)
Potassium: 4.1 mEq/L (ref 3.5–5.1)
Sodium: 141 mEq/L (ref 135–145)

## 2017-06-26 LAB — LIPID PANEL
CHOL/HDL RATIO: 6
Cholesterol: 161 mg/dL (ref 0–200)
HDL: 26.6 mg/dL — ABNORMAL LOW (ref >39.00)
NONHDL: 133.98
TRIGLYCERIDES: 301 mg/dL — AB (ref 0.0–149.0)
VLDL: 60.2 mg/dL — ABNORMAL HIGH (ref 0.0–40.0)

## 2017-06-26 LAB — URIC ACID: Uric Acid, Serum: 4.6 mg/dL (ref 4.0–7.8)

## 2017-06-26 LAB — LDL CHOLESTEROL, DIRECT: LDL DIRECT: 83 mg/dL

## 2017-06-26 LAB — HEMOGLOBIN A1C: Hgb A1c MFr Bld: 6.8 % — ABNORMAL HIGH (ref 4.6–6.5)

## 2017-06-28 LAB — HM DIABETES EYE EXAM

## 2017-07-01 ENCOUNTER — Ambulatory Visit (INDEPENDENT_AMBULATORY_CARE_PROVIDER_SITE_OTHER): Payer: BLUE CROSS/BLUE SHIELD | Admitting: Family Medicine

## 2017-07-01 ENCOUNTER — Encounter: Payer: Self-pay | Admitting: Family Medicine

## 2017-07-01 VITALS — BP 124/84 | HR 93 | Temp 98.0°F | Ht 73.25 in | Wt 271.8 lb

## 2017-07-01 DIAGNOSIS — E119 Type 2 diabetes mellitus without complications: Secondary | ICD-10-CM

## 2017-07-01 DIAGNOSIS — Z0001 Encounter for general adult medical examination with abnormal findings: Secondary | ICD-10-CM

## 2017-07-01 DIAGNOSIS — Z0000 Encounter for general adult medical examination without abnormal findings: Secondary | ICD-10-CM

## 2017-07-01 DIAGNOSIS — I10 Essential (primary) hypertension: Secondary | ICD-10-CM

## 2017-07-01 DIAGNOSIS — G4733 Obstructive sleep apnea (adult) (pediatric): Secondary | ICD-10-CM | POA: Diagnosis not present

## 2017-07-01 DIAGNOSIS — M1A9XX0 Chronic gout, unspecified, without tophus (tophi): Secondary | ICD-10-CM

## 2017-07-01 DIAGNOSIS — E785 Hyperlipidemia, unspecified: Secondary | ICD-10-CM | POA: Diagnosis not present

## 2017-07-01 DIAGNOSIS — Z87442 Personal history of urinary calculi: Secondary | ICD-10-CM | POA: Insufficient documentation

## 2017-07-01 NOTE — Assessment & Plan Note (Signed)
Does not tolerate CPAP - declines this option.  Discussed weight loss as treatment plan. He will check into dental appliances as treatment.

## 2017-07-01 NOTE — Assessment & Plan Note (Signed)
Chronic, followed by Dr Tedd SiasSolum endo.

## 2017-07-01 NOTE — Assessment & Plan Note (Signed)
Chronic stable. Continue allopurinol. No recent flares.

## 2017-07-01 NOTE — Assessment & Plan Note (Signed)
Preventative protocols reviewed and updated unless pt declined. Discussed healthy diet and lifestyle.  

## 2017-07-01 NOTE — Patient Instructions (Addendum)
Get flu shot this year - let us know if done elsewhere to update chart.  Incorporate exercise into routine  You are doing well today.  Check into dental appliance for sleep apnea treatment.   Health Maintenance, Male A healthy lifestyle and preventive care is important for your health and wellness. Ask your health care provider about what schedule of regular examinations is right for you. What should I know about weight and diet? Eat a Healthy Diet  Eat plenty of vegetables, fruits, whole grains, low-fat dairy products, and lean protein.  Do not eat a lot of foods high in solid fats, added sugars, or salt.  Maintain a Healthy Weight Regular exercise can help you achieve or maintain a healthy weight. You should:  Do at least 150 minutes of exercise each week. The exercise should increase your heart rate and make you sweat (moderate-intensity exercise).  Do strength-training exercises at least twice a week.  Watch Your Levels of Cholesterol and Blood Lipids  Have your blood tested for lipids and cholesterol every 5 years starting at 41 years of age. If you are at high risk for heart disease, you should start having your blood tested when you are 41 years old. You may need to have your cholesterol levels checked more often if: ? Your lipid or cholesterol levels are high. ? You are older than 41 years of age. ? You are at high risk for heart disease.  What should I know about cancer screening? Many types of cancers can be detected early and may often be prevented. Lung Cancer  You should be screened every year for lung cancer if: ? You are a current smoker who has smoked for at least 30 years. ? You are a former smoker who has quit within the past 15 years.  Talk to your health care provider about your screening options, when you should start screening, and how often you should be screened.  Colorectal Cancer  Routine colorectal cancer screening usually begins at 41 years of age  and should be repeated every 5-10 years until you are 41 years old. You may need to be screened more often if early forms of precancerous polyps or small growths are found. Your health care provider may recommend screening at an earlier age if you have risk factors for colon cancer.  Your health care provider may recommend using home test kits to check for hidden blood in the stool.  A small camera at the end of a tube can be used to examine your colon (sigmoidoscopy or colonoscopy). This checks for the earliest forms of colorectal cancer.  Prostate and Testicular Cancer  Depending on your age and overall health, your health care provider may do certain tests to screen for prostate and testicular cancer.  Talk to your health care provider about any symptoms or concerns you have about testicular or prostate cancer.  Skin Cancer  Check your skin from head to toe regularly.  Tell your health care provider about any new moles or changes in moles, especially if: ? There is a change in a mole's size, shape, or color. ? You have a mole that is larger than a pencil eraser.  Always use sunscreen. Apply sunscreen liberally and repeat throughout the day.  Protect yourself by wearing long sleeves, pants, a wide-brimmed hat, and sunglasses when outside.  What should I know about heart disease, diabetes, and high blood pressure?  If you are 3118-41 years of age, have your blood pressure checked every  3-5 years. If you are 75 years of age or older, have your blood pressure checked every year. You should have your blood pressure measured twice-once when you are at a hospital or clinic, and once when you are not at a hospital or clinic. Record the average of the two measurements. To check your blood pressure when you are not at a hospital or clinic, you can use: ? An automated blood pressure machine at a pharmacy. ? A home blood pressure monitor.  Talk to your health care provider about your target blood  pressure.  If you are between 75-71 years old, ask your health care provider if you should take aspirin to prevent heart disease.  Have regular diabetes screenings by checking your fasting blood sugar level. ? If you are at a normal weight and have a low risk for diabetes, have this test once every three years after the age of 77. ? If you are overweight and have a high risk for diabetes, consider being tested at a younger age or more often.  A one-time screening for abdominal aortic aneurysm (AAA) by ultrasound is recommended for men aged 25-75 years who are current or former smokers. What should I know about preventing infection? Hepatitis B If you have a higher risk for hepatitis B, you should be screened for this virus. Talk with your health care provider to find out if you are at risk for hepatitis B infection. Hepatitis C Blood testing is recommended for:  Everyone born from [birth year] through 1965.  Anyone with known risk factors for hepatitis C.  Sexually Transmitted Diseases (STDs)  You should be screened each year for STDs including gonorrhea and chlamydia if: ? You are sexually active and are younger than 41 years of age. ? You are older than 41 years of age and your health care provider tells you that you are at risk for this type of infection. ? Your sexual activity has changed since you were last screened and you are at an increased risk for chlamydia or gonorrhea. Ask your health care provider if you are at risk.  Talk with your health care provider about whether you are at high risk of being infected with HIV. Your health care provider may recommend a prescription medicine to help prevent HIV infection.  What else can I do?  Schedule regular health, dental, and eye exams.  Stay current with your vaccines (immunizations).  Do not use any tobacco products, such as cigarettes, chewing tobacco, and e-cigarettes. If you need help quitting, ask your health care  provider.  Limit alcohol intake to no more than 2 drinks per day. One drink equals 12 ounces of beer, 5 ounces of wine, or 1 ounces of hard liquor.  Do not use street drugs.  Do not share needles.  Ask your health care provider for help if you need support or information about quitting drugs.  Tell your health care provider if you often feel depressed.  Tell your health care provider if you have ever been abused or do not feel safe at home. This information is not intended to replace advice given to you by your health care provider. Make sure you discuss any questions you have with your health care provider. Document Released: 04/11/2008 Document Revised: 06/12/2016 Document Reviewed: 07/18/2015 Elsevier Interactive Patient Education  [employer].

## 2017-07-01 NOTE — Assessment & Plan Note (Signed)
Discussed healthy diet and lifestyle changes to affect sustainable weight loss  

## 2017-07-01 NOTE — Progress Notes (Signed)
BP 124/84   Pulse 93   Temp 98 F (36.7 C) (Oral)   Ht 6' 1.25" (1.861 m)   Wt 271 lb 12 oz (123.3 kg)   SpO2 96%   BMI 35.61 kg/m    CC: CPE Subjective:    Patient ID: Kyle [name], male    DOB: 1975/12/21, 41 y.o.   MRN: [id number]  HPI: Kyle [name] is a 41 y.o. male presenting on 07/01/2017 for Annual Exam and Snoring (and SHoB at night)   OSA - does not tolerate CPAP. Noticing worsening dyspnea. Dentist didn't have recommendations for dental appliance.  H/o kidney stone earlier this year.  DM - followed by Dr Tedd SiasSolum - recently started on jardiance.    Preventative: Prostate cancer in father age 41.  Flu shot yearly Td 2009 Pneumovax 2015.  Seat belt use discussed Sunscreen use discussed. No changing moles.  Non smoker Alcohol - rare  Caffeine: 4 cups/day Lives with wife and 2 children (adopted from [place]) Occupation: Production designer, theatre/television/filmmanager at [employer]  Activity: some calisthetics  Diet: good water daily, fruits/vegetables daily   Relevant past medical, surgical, family and social history reviewed and updated as indicated. Interim medical history since our last visit reviewed. Allergies and medications reviewed and updated. Outpatient Medications Prior to Visit  Medication Sig Dispense Refill  . allopurinol (ZYLOPRIM) 300 MG tablet Take 1 tablet (300 mg total) by mouth daily. 90 tablet 3  . amLODipine (NORVASC) 10 MG tablet Take 1 tablet (10 mg total) by mouth daily. 90 tablet 3  . losartan (COZAAR) 100 MG tablet Take 1 tablet (100 mg total) by mouth daily. 90 tablet 3  . metFORMIN (GLUCOPHAGE) 500 MG tablet Take 500 mg by mouth 2 (two) times daily with a meal.     . Canagliflozin 300 MG TABS Take 1 tablet (300 mg total) by mouth daily. 90 tablet 1  . HYDROcodone-acetaminophen (NORCO/VICODIN) 5-325 MG tablet Take 1 tablet by mouth 4 (four) times daily as needed. 10 tablet 0  . metroNIDAZOLE (FLAGYL) 500 MG tablet Take 1 tablet (500 mg total) by mouth 2 (two) times  daily. 14 tablet 0   No facility-administered medications prior to visit.      Per HPI unless specifically indicated in ROS section below Review of Systems  Constitutional: Negative for activity change, appetite change, chills, fatigue, fever and unexpected weight change.  HENT: Negative for hearing loss.   Eyes: Negative for visual disturbance.  Respiratory: Positive for shortness of breath. Negative for cough, chest tightness and wheezing.   Cardiovascular: Negative for chest pain, palpitations and leg swelling.  Gastrointestinal: Negative for abdominal distention, abdominal pain, blood in stool, constipation, diarrhea, nausea and vomiting.  Genitourinary: Negative for difficulty urinating and hematuria.  Musculoskeletal: Negative for arthralgias, myalgias and neck pain.  Skin: Negative for rash.  Neurological: Positive for headaches (sinus). Negative for dizziness, seizures and syncope.  Hematological: Negative for adenopathy. Does not bruise/bleed easily.  Psychiatric/Behavioral: Negative for dysphoric mood. The patient is not nervous/anxious.        Objective:    BP 124/84   Pulse 93   Temp 98 F (36.7 C) (Oral)   Ht 6' 1.25" (1.861 m)   Wt 271 lb 12 oz (123.3 kg)   SpO2 96%   BMI 35.61 kg/m   Wt Readings from Last 3 Encounters:  07/01/17 271 lb 12 oz (123.3 kg)  06/27/16 274 lb 8 oz (124.5 kg)  07/10/15 263 lb 4 oz (119.4 kg)  Physical Exam  Constitutional: He is oriented to person, place, and time. He appears well-developed and well-nourished. No distress.  HENT:  Head: Normocephalic and atraumatic.  Right Ear: Hearing, tympanic membrane, external ear and ear canal normal.  Left Ear: Hearing, tympanic membrane, external ear and ear canal normal.  Nose: Nose normal.  Mouth/Throat: Uvula is midline, oropharynx is clear and moist and mucous membranes are normal. No oropharyngeal exudate, posterior oropharyngeal edema or posterior oropharyngeal erythema.  Eyes:  Pupils are equal, round, and reactive to light. Conjunctivae and EOM are normal. No scleral icterus.  Neck: Normal range of motion. Neck supple.  Cardiovascular: Normal rate, regular rhythm, normal heart sounds and intact distal pulses.   No murmur heard. Pulses:      Radial pulses are 2+ on the right side, and 2+ on the left side.  Pulmonary/Chest: Effort normal and breath sounds normal. No respiratory distress. He has no wheezes. He has no rales.  Abdominal: Soft. Bowel sounds are normal. He exhibits no distension and no mass. There is no tenderness. There is no rebound and no guarding.  Musculoskeletal: Normal range of motion. He exhibits no edema.  Lymphadenopathy:    He has no cervical adenopathy.  Neurological: He is alert and oriented to person, place, and time.  CN grossly intact, station and gait intact  Skin: Skin is warm and dry. No rash noted.  Psychiatric: He has a normal mood and affect. His behavior is normal. Judgment and thought content normal.  Nursing note and vitals reviewed.  Results for orders placed or performed in visit on 06/26/17  Lipid panel  Result Value Ref Range   Cholesterol 161 0 - 200 mg/dL   Triglycerides 161.0 (H) 0.0 - 149.0 mg/dL   HDL 96.04 (L) >54.09 mg/dL   VLDL 81.1 (H) 0.0 - 91.4 mg/dL   Total CHOL/HDL Ratio 6    NonHDL 133.98   Basic metabolic panel  Result Value Ref Range   Sodium 141 135 - 145 mEq/L   Potassium 4.1 3.5 - 5.1 mEq/L   Chloride 104 96 - 112 mEq/L   CO2 30 19 - 32 mEq/L   Glucose, Bld 133 (H) 70 - 99 mg/dL   BUN 17 6 - 23 mg/dL   Creatinine, Ser 7.82 0.40 - 1.50 mg/dL   Calcium 9.6 8.4 - 95.6 mg/dL   GFR 213.08 >65.78 mL/min  Uric acid  Result Value Ref Range   Uric Acid, Serum 4.6 4.0 - 7.8 mg/dL  Hemoglobin I6N  Result Value Ref Range   Hgb A1c MFr Bld 6.8 (H) 4.6 - 6.5 %  LDL cholesterol, direct  Result Value Ref Range   Direct LDL 83.0 mg/dL      Assessment & Plan:   Problem List Items Addressed This Visit     Diabetes type 2, controlled (HCC)    Chronic, followed by Dr Tedd Sias endo.       Relevant Medications   empagliflozin (JARDIANCE) 25 MG TABS tablet   Dyslipidemia    Chronic, stable off meds. Suggested he start fish oil daily. Reviewed other diet changes to improve triglyceride levels. The 10-year ASCVD risk score Denman Cuthbert DC Jr., et al., 2013) is: 4.3%   Values used to calculate the score:     Age: 39 years     Sex: Male     Is Non-Hispanic African American: No     Diabetic: Yes     Tobacco smoker: No     Systolic Blood Pressure: 124 mmHg  Is BP treated: Yes     HDL Cholesterol: 26.6 mg/dL     Total Cholesterol: 161 mg/dL       Essential hypertension    Chronic, stable. Continue current regimen.       Gout    Chronic stable. Continue allopurinol. No recent flares.       Health maintenance examination - Primary    Preventative protocols reviewed and updated unless pt declined. Discussed healthy diet and lifestyle.       History of kidney stones   OSA (obstructive sleep apnea)    Does not tolerate CPAP - declines this option.  Discussed weight loss as treatment plan. He will check into dental appliances as treatment.       Severe obesity (BMI 35.0-39.9) with comorbidity (HCC)    Discussed healthy diet and lifestyle changes to affect sustainable weight loss.       Relevant Medications   empagliflozin (JARDIANCE) 25 MG TABS tablet       Follow up plan: Return in about 1 year (around 07/01/2018) for annual exam, prior fasting for blood work.  Eustaquio Boyden, MD

## 2017-07-01 NOTE — Assessment & Plan Note (Signed)
Chronic, stable off meds. Suggested he start fish oil daily. Reviewed other diet changes to improve triglyceride levels. The 10-year ASCVD risk score Denman Gearl(Goff DC Montez HagemanJr., et al., 2013) is: 4.3%   Values used to calculate the score:     Age: [age] years     Sex: Male     Is Non-Hispanic African American: No     Diabetic: Yes     Tobacco smoker: No     Systolic Blood Pressure: 124 mmHg     Is BP treated: Yes     HDL Cholesterol: 26.6 mg/dL     Total Cholesterol: 161 mg/dL

## 2017-07-01 NOTE — Assessment & Plan Note (Signed)
Chronic, stable. Continue current regimen. 

## 2017-07-06 ENCOUNTER — Other Ambulatory Visit: Payer: Self-pay | Admitting: Family Medicine

## 2017-07-15 DIAGNOSIS — E119 Type 2 diabetes mellitus without complications: Secondary | ICD-10-CM | POA: Diagnosis not present

## 2017-07-22 DIAGNOSIS — E1129 Type 2 diabetes mellitus with other diabetic kidney complication: Secondary | ICD-10-CM | POA: Diagnosis not present

## 2017-07-22 DIAGNOSIS — R809 Proteinuria, unspecified: Secondary | ICD-10-CM | POA: Diagnosis not present

## 2017-08-10 DIAGNOSIS — Z23 Encounter for immunization: Secondary | ICD-10-CM | POA: Diagnosis not present

## 2018-03-08 DIAGNOSIS — J069 Acute upper respiratory infection, unspecified: Secondary | ICD-10-CM | POA: Diagnosis not present

## 2018-03-12 DIAGNOSIS — H10023 Other mucopurulent conjunctivitis, bilateral: Secondary | ICD-10-CM | POA: Diagnosis not present

## 2018-03-13 ENCOUNTER — Ambulatory Visit: Payer: BLUE CROSS/BLUE SHIELD | Admitting: Family Medicine

## 2018-04-07 ENCOUNTER — Encounter: Payer: Self-pay | Admitting: Family Medicine

## 2018-04-07 ENCOUNTER — Ambulatory Visit (INDEPENDENT_AMBULATORY_CARE_PROVIDER_SITE_OTHER): Payer: BLUE CROSS/BLUE SHIELD | Admitting: Family Medicine

## 2018-04-07 VITALS — BP 122/84 | HR 91 | Temp 98.1°F | Ht 73.0 in | Wt 270.8 lb

## 2018-04-07 DIAGNOSIS — H1033 Unspecified acute conjunctivitis, bilateral: Secondary | ICD-10-CM

## 2018-04-07 DIAGNOSIS — E119 Type 2 diabetes mellitus without complications: Secondary | ICD-10-CM

## 2018-04-07 DIAGNOSIS — H109 Unspecified conjunctivitis: Secondary | ICD-10-CM | POA: Insufficient documentation

## 2018-04-07 DIAGNOSIS — R5383 Other fatigue: Secondary | ICD-10-CM

## 2018-04-07 LAB — CBC WITH DIFFERENTIAL/PLATELET
Basophils Absolute: 0.1 10*3/uL (ref 0.0–0.1)
Basophils Relative: 1.1 % (ref 0.0–3.0)
EOS PCT: 2.9 % (ref 0.0–5.0)
Eosinophils Absolute: 0.2 10*3/uL (ref 0.0–0.7)
HCT: 45.1 % (ref 39.0–52.0)
Hemoglobin: 15.6 g/dL (ref 13.0–17.0)
LYMPHS ABS: 2.2 10*3/uL (ref 0.7–4.0)
Lymphocytes Relative: 33.7 % (ref 12.0–46.0)
MCHC: 34.7 g/dL (ref 30.0–36.0)
MCV: 83.4 fl (ref 78.0–100.0)
MONO ABS: 0.5 10*3/uL (ref 0.1–1.0)
MONOS PCT: 8.3 % (ref 3.0–12.0)
NEUTROS ABS: 3.6 10*3/uL (ref 1.4–7.7)
NEUTROS PCT: 54 % (ref 43.0–77.0)
PLATELETS: 231 10*3/uL (ref 150.0–400.0)
RBC: 5.41 Mil/uL (ref 4.22–5.81)
RDW: 13.7 % (ref 11.5–15.5)
WBC: 6.6 10*3/uL (ref 4.0–10.5)

## 2018-04-07 LAB — TSH: TSH: 2.13 u[IU]/mL (ref 0.35–4.50)

## 2018-04-07 LAB — COMPREHENSIVE METABOLIC PANEL
ALT: 84 U/L — AB (ref 0–53)
AST: 42 U/L — ABNORMAL HIGH (ref 0–37)
Albumin: 4.8 g/dL (ref 3.5–5.2)
Alkaline Phosphatase: 49 U/L (ref 39–117)
BILIRUBIN TOTAL: 0.6 mg/dL (ref 0.2–1.2)
BUN: 13 mg/dL (ref 6–23)
CO2: 29 mEq/L (ref 19–32)
Calcium: 9.7 mg/dL (ref 8.4–10.5)
Chloride: 103 mEq/L (ref 96–112)
Creatinine, Ser: 0.75 mg/dL (ref 0.40–1.50)
GFR: 121.29 mL/min (ref >60.00)
GLUCOSE: 138 mg/dL — AB (ref 70–99)
POTASSIUM: 4.3 meq/L (ref 3.5–5.1)
SODIUM: 140 meq/L (ref 135–145)
TOTAL PROTEIN: 7.2 g/dL (ref 6.0–8.3)

## 2018-04-07 LAB — HEMOGLOBIN A1C: Hgb A1c MFr Bld: 7.3 % — ABNORMAL HIGH (ref 4.6–6.5)

## 2018-04-07 LAB — VITAMIN D 25 HYDROXY (VIT D DEFICIENCY, FRACTURES): VITD: 14.07 ng/mL — ABNORMAL LOW (ref 30.00–100.00)

## 2018-04-07 LAB — VITAMIN B12: Vitamin B-12: 395 pg/mL (ref 211–911)

## 2018-04-07 MED ORDER — VITAMIN C 500 MG PO CAPS: 1.0000 | ORAL_CAPSULE | Freq: Every day | ORAL | Status: DC

## 2018-04-07 NOTE — Assessment & Plan Note (Signed)
Overdue for f/u - check labs today.  Continue metformin and jardiance.

## 2018-04-07 NOTE — Assessment & Plan Note (Signed)
Ongoing over 6 months with cold intolerance and increased propensity for viral illness. Encouraged start vit C 500mg  daily. Check labs for reversible causes of fatigue including thyroid function.

## 2018-04-07 NOTE — Patient Instructions (Addendum)
Labs today including diabetes check.  Start vitamin C 500 mg daily.  Continue eye drops for pink eye, restart lubricating eye drops. If persistent despite this, see eye doctor.   Bacterial Conjunctivitis Bacterial conjunctivitis is an infection of your conjunctiva. This is the clear membrane that covers the white part of your eye and the inner surface of your eyelid. This condition can make your eye:  Red or pink.  Itchy.  This condition is caused by bacteria. This condition spreads very easily from person to person (is contagious) and from one eye to the other eye. Follow these instructions at home: Medicines  Take or apply your antibiotic medicine as told by your doctor. Do not stop taking or applying the antibiotic even if you start to feel better.  Take or apply over-the-counter and prescription medicines only as told by your doctor.  Do not touch your eyelid with the eye drop bottle or the ointment tube. Managing discomfort  Wipe any fluid from your eye with a warm, wet washcloth or a cotton ball.  Place a cool, clean washcloth on your eye. Do this for 10-20 minutes, 3-4 times per day. General instructions  Do not wear contact lenses until the irritation is gone. Wear glasses until your doctor says it is okay to wear contacts.  Do not wear eye makeup until your symptoms are gone. Throw away any old makeup.  Change or wash your pillowcase every day.  Do not share towels or washcloths with anyone.  Wash your hands often with soap and water. Use paper towels to dry your hands.  Do not touch or rub your eyes.  Do not drive or use heavy machinery if your vision is blurry. Contact a doctor if:  You have a fever.  Your symptoms do not get better after 10 days. Get help right away if:  You have a fever and your symptoms suddenly get worse.  You have very bad pain when you move your eye.  Your face: ? Hurts. ? Is red. ? Is swollen.  You have sudden loss of  vision. This information is not intended to replace advice given to you by your health care provider. Make sure you discuss any questions you have with your health care provider. Document Released: 07/23/2008 Document Revised: 03/21/2016 Document Reviewed: 07/27/2015 Elsevier Interactive Patient Education  [employer]2018 [employer] Inc[employer]

## 2018-04-07 NOTE — Assessment & Plan Note (Signed)
Anticipate recurrent viral or bacterial conjunctivitis. Has restarted polytrim eye drops. Advised f/u with ophtho if not improving as expected with treatment. Pt agrees with plan.

## 2018-04-07 NOTE — Progress Notes (Signed)
BP 122/84 (BP Location: Left Arm, Patient Position: Sitting, Cuff Size: Large)   Pulse 91   Temp 98.1 F (36.7 C) (Oral)   Ht 6\' 1"  (1.854 m)   Wt 270 lb 12 oz (122.8 kg)   SpO2 96%   BMI 35.72 kg/m    CC: fatigue Subjective:    Patient ID: Kyle [name], male    DOB: [birth year], 42 y.o.   MRN: [id number]  HPI: Kyle LowensteinGeorge [name] is a 42 y.o. male presenting on 04/07/2018 for Fatigue (Also, feeling run down for about 6 mos.); Eye Problem (Dx with pink eye last month. Cleared up but seems to have returned. ); and Elevated BS (Has had elevated BS. Avg, 153. )   Has been sick a lot the last 6 months - stomach bug, viral illness, etc, latest pink eye 1 month ago. This improved but now recurring over last 4-5 days - discomfort. No vision changes. No pain with eye movements. Restarted his polytrim eye drops 3-4 days ago. Matted shut in the morning with drainage. Mild cough and congestion. Also started taking zyrtec.   No fevers/chills, body aches, joint pains, rashes, unexpected weight loss. No recent tick bites. No blood in stool or diarrhea.   DM - followed by Dr Tedd SiasSolum, on jardiance and metformin, last seen 03/2017. Saw eye  doctor sept or oct 2018. Sugars have been running elevated - fasting 135 this morning.   Relevant past medical, surgical, family and social history reviewed and updated as indicated. Interim medical history since our last visit reviewed. Allergies and medications reviewed and updated. Outpatient Medications Prior to Visit  Medication Sig Dispense Refill  . allopurinol (ZYLOPRIM) 300 MG tablet TAKE 1 TABLET (300 MG TOTAL) BY MOUTH DAILY. 90 tablet 3  . amLODipine (NORVASC) 10 MG tablet TAKE 1 TABLET (10 MG TOTAL) BY MOUTH DAILY. 90 tablet 3  . cetirizine (ZYRTEC) 10 MG tablet Take 10 mg by mouth daily.    . empagliflozin (JARDIANCE) 25 MG TABS tablet Take 25 mg by mouth daily.    Marland Kitchen. losartan (COZAAR) 100 MG tablet TAKE 1 TABLET (100 MG TOTAL) BY MOUTH DAILY. 90 tablet 3  .  metFORMIN (GLUCOPHAGE) 500 MG tablet Take 1,000 mg by mouth daily with breakfast.     . Omega-3 Fatty Acids (OMEGA-3 EPA FISH OIL PO) Take 1 capsule by mouth daily.    . Oxytetracycline-Polymyxin B (TERRAMYCIN/POLYMYXIN B SULFATE OP) Apply 2 drops to eye 2 (two) times daily. In each eye     No facility-administered medications prior to visit.      Per HPI unless specifically indicated in ROS section below Review of Systems     Objective:    BP 122/84 (BP Location: Left Arm, Patient Position: Sitting, Cuff Size: Large)   Pulse 91   Temp 98.1 F (36.7 C) (Oral)   Ht 6\' 1"  (1.854 m)   Wt 270 lb 12 oz (122.8 kg)   SpO2 96%   BMI 35.72 kg/m   Wt Readings from Last 3 Encounters:  04/07/18 270 lb 12 oz (122.8 kg)  07/01/17 271 lb 12 oz (123.3 kg)  06/27/16 274 lb 8 oz (124.5 kg)    Physical Exam  Constitutional: He appears well-developed and well-nourished. No distress.  HENT:  Mouth/Throat: Oropharynx is clear and moist. No oropharyngeal exudate.  Eyes: Pupils are equal, round, and reactive to light. EOM are normal. Right eye exhibits discharge. Left eye exhibits discharge. Right conjunctiva is injected. Left conjunctiva is injected.  Erythema of bulbar and palpebral conjunctiva with matting present bilaterally No photophobia  Neck: Normal range of motion. Neck supple. Thyromegaly (?) present.  Cardiovascular: Normal rate, regular rhythm and normal heart sounds.  No murmur heard. Pulmonary/Chest: Effort normal and breath sounds normal. No respiratory distress. He has no wheezes. He has no rales.  Musculoskeletal: He exhibits no edema.  Lymphadenopathy:    He has no cervical adenopathy.  Nursing note and vitals reviewed.  Results for orders placed or performed in visit on 04/07/18  HM DIABETES EYE EXAM  Result Value Ref Range   HM Diabetic Eye Exam No Retinopathy No Retinopathy      Assessment & Plan:   Problem List Items Addressed This Visit    Fatigue - Primary     Ongoing over 6 months with cold intolerance and increased propensity for viral illness. Encouraged start vit C 500mg  daily. Check labs for reversible causes of fatigue including thyroid function.       Relevant Orders   TSH   Comprehensive metabolic panel   CBC with Differential/Platelet   Vitamin B12   VITAMIN D 25 Hydroxy (Vit-D Deficiency, Fractures)   Diabetes type 2, controlled (HCC)    Overdue for f/u - check labs today.  Continue metformin and jardiance.       Relevant Orders   Comprehensive metabolic panel   Vitamin B12   Hemoglobin A1c   Conjunctivitis of both eyes    Anticipate recurrent viral or bacterial conjunctivitis. Has restarted polytrim eye drops. Advised f/u with ophtho if not improving as expected with treatment. Pt agrees with plan.           Meds ordered this encounter  Medications  . Ascorbic Acid (VITAMIN C) 500 MG CAPS    Sig: Take 1 capsule by mouth daily.    Dispense:  60 capsule   Orders Placed This Encounter  Procedures  . TSH  . Comprehensive metabolic panel  . CBC with Differential/Platelet  . Vitamin B12  . VITAMIN D 25 Hydroxy (Vit-D Deficiency, Fractures)  . Hemoglobin A1c  . HM DIABETES EYE EXAM    This external order was created through the Results Console.    Follow up plan: Return if symptoms worsen or fail to improve.  Eustaquio Boyden, MD

## 2018-04-09 ENCOUNTER — Other Ambulatory Visit: Payer: Self-pay | Admitting: Family Medicine

## 2018-04-09 MED ORDER — VITAMIN D 50 MCG (2000 UT) PO CAPS: 1.0000 | ORAL_CAPSULE | Freq: Every day | ORAL | Status: DC

## 2018-04-09 MED ORDER — CYANOCOBALAMIN 500 MCG PO TABS: 500.0000 ug | ORAL_TABLET | Freq: Every day | ORAL | Status: DC

## 2018-04-14 DIAGNOSIS — H1089 Other conjunctivitis: Secondary | ICD-10-CM | POA: Diagnosis not present

## 2018-04-16 DIAGNOSIS — H16143 Punctate keratitis, bilateral: Secondary | ICD-10-CM | POA: Diagnosis not present

## 2018-04-21 DIAGNOSIS — H16143 Punctate keratitis, bilateral: Secondary | ICD-10-CM | POA: Diagnosis not present

## 2018-04-22 DIAGNOSIS — H1013 Acute atopic conjunctivitis, bilateral: Secondary | ICD-10-CM | POA: Diagnosis not present

## 2018-05-06 DIAGNOSIS — H1013 Acute atopic conjunctivitis, bilateral: Secondary | ICD-10-CM | POA: Diagnosis not present

## 2018-06-08 DIAGNOSIS — H0259 Other disorders affecting eyelid function: Secondary | ICD-10-CM | POA: Diagnosis not present

## 2018-06-30 ENCOUNTER — Other Ambulatory Visit (INDEPENDENT_AMBULATORY_CARE_PROVIDER_SITE_OTHER): Payer: BLUE CROSS/BLUE SHIELD

## 2018-06-30 ENCOUNTER — Other Ambulatory Visit: Payer: Self-pay | Admitting: Family Medicine

## 2018-06-30 DIAGNOSIS — E559 Vitamin D deficiency, unspecified: Secondary | ICD-10-CM

## 2018-06-30 DIAGNOSIS — E119 Type 2 diabetes mellitus without complications: Secondary | ICD-10-CM

## 2018-06-30 DIAGNOSIS — R5383 Other fatigue: Secondary | ICD-10-CM | POA: Diagnosis not present

## 2018-06-30 DIAGNOSIS — M1A9XX0 Chronic gout, unspecified, without tophus (tophi): Secondary | ICD-10-CM

## 2018-06-30 DIAGNOSIS — E785 Hyperlipidemia, unspecified: Secondary | ICD-10-CM

## 2018-06-30 DIAGNOSIS — R7401 Elevation of levels of liver transaminase levels: Secondary | ICD-10-CM

## 2018-06-30 DIAGNOSIS — R740 Nonspecific elevation of levels of transaminase and lactic acid dehydrogenase [LDH]: Secondary | ICD-10-CM

## 2018-06-30 LAB — COMPREHENSIVE METABOLIC PANEL
ALK PHOS: 48 U/L (ref 39–117)
ALT: 61 U/L — ABNORMAL HIGH (ref 0–53)
AST: 25 U/L (ref 0–37)
Albumin: 4.7 g/dL (ref 3.5–5.2)
BUN: 19 mg/dL (ref 6–23)
CHLORIDE: 102 meq/L (ref 96–112)
CO2: 27 mEq/L (ref 19–32)
CREATININE: 0.83 mg/dL (ref 0.40–1.50)
Calcium: 9.9 mg/dL (ref 8.4–10.5)
GFR: 107.78 mL/min (ref >60.00)
GLUCOSE: 149 mg/dL — AB (ref 70–99)
POTASSIUM: 4.2 meq/L (ref 3.5–5.1)
Sodium: 140 mEq/L (ref 135–145)
TOTAL PROTEIN: 7.3 g/dL (ref 6.0–8.3)
Total Bilirubin: 0.5 mg/dL (ref 0.2–1.2)

## 2018-06-30 LAB — VITAMIN D 25 HYDROXY (VIT D DEFICIENCY, FRACTURES): VITD: 20.07 ng/mL — ABNORMAL LOW (ref 30.00–100.00)

## 2018-06-30 LAB — SEDIMENTATION RATE: Sed Rate: 7 mm/hr (ref 0–15)

## 2018-06-30 LAB — LDL CHOLESTEROL, DIRECT: LDL DIRECT: 95 mg/dL

## 2018-06-30 LAB — LIPID PANEL
Cholesterol: 184 mg/dL (ref 0–200)
HDL: 27.6 mg/dL — ABNORMAL LOW (ref >39.00)
Total CHOL/HDL Ratio: 7
Triglycerides: 466 mg/dL — ABNORMAL HIGH (ref 0.0–149.0)

## 2018-06-30 LAB — HEMOGLOBIN A1C: HEMOGLOBIN A1C: 7.2 % — AB (ref 4.6–6.5)

## 2018-06-30 LAB — URIC ACID: Uric Acid, Serum: 5.2 mg/dL (ref 4.0–7.8)

## 2018-06-30 LAB — IBC PANEL
Iron: 78 ug/dL (ref 42–165)
SATURATION RATIOS: 17.5 % — AB (ref 20.0–50.0)
Transferrin: 318 mg/dL (ref 212.0–360.0)

## 2018-06-30 NOTE — Addendum Note (Signed)
Addended by: Eustaquio Boyden on: 06/30/2018 07:15 AM   Modules accepted: Orders

## 2018-07-02 DIAGNOSIS — Z0279 Encounter for issue of other medical certificate: Secondary | ICD-10-CM

## 2018-07-03 ENCOUNTER — Encounter: Payer: Self-pay | Admitting: Family Medicine

## 2018-07-03 ENCOUNTER — Ambulatory Visit (INDEPENDENT_AMBULATORY_CARE_PROVIDER_SITE_OTHER): Payer: BLUE CROSS/BLUE SHIELD | Admitting: Family Medicine

## 2018-07-03 VITALS — BP 120/80 | HR 95 | Temp 98.2°F | Ht 73.75 in | Wt 268.2 lb

## 2018-07-03 DIAGNOSIS — Z23 Encounter for immunization: Secondary | ICD-10-CM | POA: Diagnosis not present

## 2018-07-03 DIAGNOSIS — E1169 Type 2 diabetes mellitus with other specified complication: Secondary | ICD-10-CM

## 2018-07-03 DIAGNOSIS — H0259 Other disorders affecting eyelid function: Secondary | ICD-10-CM

## 2018-07-03 DIAGNOSIS — M1A9XX0 Chronic gout, unspecified, without tophus (tophi): Secondary | ICD-10-CM

## 2018-07-03 DIAGNOSIS — E785 Hyperlipidemia, unspecified: Secondary | ICD-10-CM

## 2018-07-03 DIAGNOSIS — Z0000 Encounter for general adult medical examination without abnormal findings: Secondary | ICD-10-CM | POA: Diagnosis not present

## 2018-07-03 DIAGNOSIS — E669 Obesity, unspecified: Secondary | ICD-10-CM | POA: Diagnosis not present

## 2018-07-03 DIAGNOSIS — Z87442 Personal history of urinary calculi: Secondary | ICD-10-CM

## 2018-07-03 DIAGNOSIS — E559 Vitamin D deficiency, unspecified: Secondary | ICD-10-CM

## 2018-07-03 DIAGNOSIS — I10 Essential (primary) hypertension: Secondary | ICD-10-CM

## 2018-07-03 DIAGNOSIS — G4733 Obstructive sleep apnea (adult) (pediatric): Secondary | ICD-10-CM

## 2018-07-03 LAB — POC URINALSYSI DIPSTICK (AUTOMATED)
Bilirubin, UA: NEGATIVE
Glucose, UA: POSITIVE — AB
Ketones, UA: NEGATIVE
Leukocytes, UA: NEGATIVE
Nitrite, UA: NEGATIVE
PH UA: 6 (ref 5.0–8.0)
Protein, UA: NEGATIVE
RBC UA: NEGATIVE
SPEC GRAV UA: 1.025 (ref 1.010–1.025)
UROBILINOGEN UA: 0.2 U/dL

## 2018-07-03 MED ORDER — LOSARTAN POTASSIUM 100 MG PO TABS: 100.0000 mg | ORAL_TABLET | Freq: Every day | ORAL | 3 refills | Status: DC

## 2018-07-03 MED ORDER — ATORVASTATIN CALCIUM 20 MG PO TABS: 20.0000 mg | ORAL_TABLET | Freq: Every day | ORAL | 3 refills | Status: DC

## 2018-07-03 MED ORDER — VITAMIN D3 1.25 MG (50000 UT) PO TABS: 1.0000 | ORAL_TABLET | ORAL | 1 refills | Status: DC

## 2018-07-03 MED ORDER — ALLOPURINOL 300 MG PO TABS: 300.0000 mg | ORAL_TABLET | Freq: Every day | ORAL | 3 refills | Status: DC

## 2018-07-03 MED ORDER — AMLODIPINE BESYLATE 10 MG PO TABS: 10.0000 mg | ORAL_TABLET | Freq: Every day | ORAL | 3 refills | Status: DC

## 2018-07-03 NOTE — Assessment & Plan Note (Addendum)
Predominantly hypertriglyceridemia, also low HDL. Triglycerides continue increasing despite fish oil, healthy diet efforts. Discussed options - will stop fish oil and start lipitor 20mg  daily. RTC 6 mo f/u.  The 10-year ASCVD risk score Denman Adama DC Montez Hageman., et al., 2013) is: 5.5%   Values used to calculate the score:     Age: 42 years     Sex: Male     Is Non-Hispanic African American: No     Diabetic: Yes     Tobacco smoker: No     Systolic Blood Pressure: 120 mmHg     Is BP treated: Yes     HDL Cholesterol: 27.6 mg/dL     Total Cholesterol: 184 mg/dL

## 2018-07-03 NOTE — Assessment & Plan Note (Signed)
Update UA. Endorses several flank pain episodes in the past 2 yrs, only has passed stone once.

## 2018-07-03 NOTE — Assessment & Plan Note (Signed)
Chronic, stable. Will trial allopurinol 300mg  1/2 tab daily.

## 2018-07-03 NOTE — Assessment & Plan Note (Signed)
Unfortunately continues having trouble tolerating CPAP machine. No good dental options. 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 Doesn't have significant oropharyngeal crowding, but requests ENT referral for eval for surgical intervention.

## 2018-07-03 NOTE — Progress Notes (Signed)
BP 120/80 (BP Location: Left Arm, Patient Position: Sitting, Cuff Size: Large)   Pulse 95   Temp 98.2 F (36.8 C) (Oral)   Ht 6' 1.75" (1.873 m)   Wt 268 lb 4 oz (121.7 kg)   SpO2 95%   BMI 34.68 kg/m    CC: CPE Subjective:    Patient ID: Kyle [name], male    DOB: Mar 20, 1976, 43 y.o.   MRN: [id number]  HPI: Kyle [name] is a 42 y.o. male presenting on 07/03/2018 for Annual Exam   Fatigue slowly improving with vit D and C and B12.  Saw eye doctor - told had floppy eyelid syndrome leading to dry eyes - told by product of sleep apnea - unable to handle CPAP despite multiple attempts, latest a few weeks ago. Would consider surgical intervention.   DM - followed by Dr Tedd Sias. Good vegetables.  Intermittent episodes of L flank pain in the last 2 yrs - has passed 1 kidney stone in the past year.   Preventative: Prostate cancer in father age 2.  Flu shot yearly Td 2009, Tdap 2019 Pneumovax 2015.  Seat belt use discussed  Sunscreen use discussed. No changing moles. Non smoker Alcohol - rare Dentist Q6 mo Eye exam yearly  Caffeine: 4 cups/day Lives with wife and 2 children (adopted from [place]) Occupation: Production designer, theatre/television/film at [employer]  Activity: no regular exercise  Diet: good water daily, fruits/vegetables daily   Relevant past medical, surgical, family and social history reviewed and updated as indicated. Interim medical history since our last visit reviewed. Allergies and medications reviewed and updated. Outpatient Medications Prior to Visit  Medication Sig Dispense Refill  . Ascorbic Acid (VITAMIN C) 500 MG CAPS Take 1 capsule by mouth daily. 60 capsule   . cetirizine (ZYRTEC) 10 MG tablet Take 10 mg by mouth daily.    . Cholecalciferol (VITAMIN D) 2000 units CAPS Take 1 capsule (2,000 Units total) by mouth daily. 30 capsule   . empagliflozin (JARDIANCE) 25 MG TABS tablet Take 25 mg by mouth daily.    . Hypromellose (ARTIFICIAL TEARS OP) Apply to eye 2 (two) times  daily.    . metFORMIN (GLUCOPHAGE) 500 MG tablet Take 1,000 mg by mouth daily with breakfast.     . vitamin B-12 (V-R VITAMIN B-12) 500 MCG tablet Take 1 tablet (500 mcg total) by mouth daily.    Marland Kitchen allopurinol (ZYLOPRIM) 300 MG tablet TAKE 1 TABLET (300 MG TOTAL) BY MOUTH DAILY. 90 tablet 3  . amLODipine (NORVASC) 10 MG tablet TAKE 1 TABLET (10 MG TOTAL) BY MOUTH DAILY. 90 tablet 3  . losartan (COZAAR) 100 MG tablet TAKE 1 TABLET (100 MG TOTAL) BY MOUTH DAILY. 90 tablet 3  . Omega-3 Fatty Acids (OMEGA-3 EPA FISH OIL PO) Take 1 capsule by mouth daily.    . Oxytetracycline-Polymyxin B (TERRAMYCIN/POLYMYXIN B SULFATE OP) Apply 2 drops to eye 2 (two) times daily. In each eye     No facility-administered medications prior to visit.      Per HPI unless specifically indicated in ROS section below Review of Systems  Constitutional: Negative for activity change, appetite change, chills, fatigue, fever and unexpected weight change.  HENT: Negative for hearing loss.   Eyes: Negative for visual disturbance.  Respiratory: Positive for cough (mild). Negative for chest tightness, shortness of breath and wheezing.   Cardiovascular: Negative for chest pain, palpitations and leg swelling.  Gastrointestinal: Negative for abdominal distention, abdominal pain, blood in stool, constipation, diarrhea, nausea and vomiting.  Genitourinary: Negative for difficulty urinating and hematuria.  Musculoskeletal: Negative for arthralgias, myalgias and neck pain.       Foot pain - swelling  Skin: Negative for rash.  Neurological: Positive for dizziness (occasional orthostasis). Negative for seizures, syncope and headaches.  Hematological: Negative for adenopathy. Does not bruise/bleed easily.  Psychiatric/Behavioral: Negative for dysphoric mood. The patient is not nervous/anxious.        Objective:    BP 120/80 (BP Location: Left Arm, Patient Position: Sitting, Cuff Size: Large)   Pulse 95   Temp 98.2 F (36.8 C)  (Oral)   Ht 6' 1.75" (1.873 m)   Wt 268 lb 4 oz (121.7 kg)   SpO2 95%   BMI 34.68 kg/m   Wt Readings from Last 3 Encounters:  07/03/18 268 lb 4 oz (121.7 kg)  04/07/18 270 lb 12 oz (122.8 kg)  07/01/17 271 lb 12 oz (123.3 kg)    Physical Exam  Constitutional: He is oriented to person, place, and time. He appears well-developed and well-nourished. No distress.  HENT:  Head: Normocephalic and atraumatic.  Right Ear: Hearing, tympanic membrane, external ear and ear canal normal.  Left Ear: Hearing, tympanic membrane, external ear and ear canal normal.  Nose: Nose normal.  Mouth/Throat: Uvula is midline, oropharynx is clear and moist and mucous membranes are normal. No oropharyngeal exudate, posterior oropharyngeal edema or posterior oropharyngeal erythema.  Eyes: Pupils are equal, round, and reactive to light. Conjunctivae and EOM are normal. No scleral icterus.  Neck: Normal range of motion. Neck supple. No thyromegaly present.  Cardiovascular: Normal rate, regular rhythm, normal heart sounds and intact distal pulses.  No murmur heard. Pulses:      Radial pulses are 2+ on the right side, and 2+ on the left side.  Pulmonary/Chest: Effort normal and breath sounds normal. No respiratory distress. He has no wheezes. He has no rales.  Abdominal: Soft. Bowel sounds are normal. He exhibits no distension and no mass. There is no tenderness. There is no rebound and no guarding.  Musculoskeletal: Normal range of motion. He exhibits no edema.  Lymphadenopathy:    He has no cervical adenopathy.  Neurological: He is alert and oriented to person, place, and time.  CN grossly intact, station and gait intact  Skin: Skin is warm and dry. No rash noted.  Psychiatric: He has a normal mood and affect. His behavior is normal. Judgment and thought content normal.  Nursing note and vitals reviewed.  Diabetic Foot Exam - Simple   Simple Foot Form Diabetic Foot exam was performed with the following  findings:  Yes 07/03/2018  7:58 AM  Visual Inspection No deformities, no ulcerations, no other skin breakdown bilaterally:  Yes Sensation Testing Intact to touch and monofilament testing bilaterally:  Yes Pulse Check Posterior Tibialis and Dorsalis pulse intact bilaterally:  Yes Comments     Results for orders placed or performed in visit on 06/30/18  Sedimentation rate  Result Value Ref Range   Sed Rate 7 0 - 15 mm/hr  IBC panel  Result Value Ref Range   Iron 78 42 - 165 ug/dL   Transferrin 161.0 960.4 - 360.0 mg/dL   Saturation Ratios 54.0 (L) 20.0 - 50.0 %  VITAMIN D 25 Hydroxy (Vit-D Deficiency, Fractures)  Result Value Ref Range   VITD 20.07 (L) 30.00 - 100.00 ng/mL  Uric acid  Result Value Ref Range   Uric Acid, Serum 5.2 4.0 - 7.8 mg/dL  Hemoglobin J8J  Result Value Ref Range  Hgb A1c MFr Bld 7.2 (H) 4.6 - 6.5 %  Comprehensive metabolic panel  Result Value Ref Range   Sodium 140 135 - 145 mEq/L   Potassium 4.2 3.5 - 5.1 mEq/L   Chloride 102 96 - 112 mEq/L   CO2 27 19 - 32 mEq/L   Glucose, Bld 149 (H) 70 - 99 mg/dL   BUN 19 6 - 23 mg/dL   Creatinine, Ser 1.61 0.40 - 1.50 mg/dL   Total Bilirubin 0.5 0.2 - 1.2 mg/dL   Alkaline Phosphatase 48 39 - 117 U/L   AST 25 0 - 37 U/L   ALT 61 (H) 0 - 53 U/L   Total Protein 7.3 6.0 - 8.3 g/dL   Albumin 4.7 3.5 - 5.2 g/dL   Calcium 9.9 8.4 - 09.6 mg/dL   GFR 045.40 >98.11 mL/min  Lipid panel  Result Value Ref Range   Cholesterol 184 0 - 200 mg/dL   Triglycerides (H) 0.0 - 149.0 mg/dL    914.7 Triglyceride is over 400; calculations on Lipids are invalid.   HDL 27.60 (L) >39.00 mg/dL   Total CHOL/HDL Ratio 7   LDL cholesterol, direct  Result Value Ref Range   Direct LDL 95.0 mg/dL      Assessment & Plan:   Problem List Items Addressed This Visit    Vitamin D deficiency    Levels remain low despite daily 2000 IU - will Rx 50k weekly.       OSA (obstructive sleep apnea)    Unfortunately continues having trouble  tolerating CPAP machine. No good dental options. 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 Doesn't have significant oropharyngeal crowding, but requests ENT referral for eval for surgical intervention.       Relevant Orders   Ambulatory referral to ENT   Obesity, Class I, BMI 30-34.9    Encouraged healthy diet and lifestyle changes to affect sustainable weight loss.      History of kidney stones    Update UA. Endorses several flank pain episodes in the past 2 yrs, only has passed stone once.      Health maintenance examination - Primary    Preventative protocols reviewed and updated unless pt declined. Discussed healthy diet and lifestyle.       Gout    Chronic, stable. Will trial allopurinol 300mg  1/2 tab daily.       Floppy eyelid syndrome    Using lubricating tear drops as well as eye mask at night.      Essential hypertension    Chronic, stable. Continue current regimen.       Relevant Medications   amLODipine (NORVASC) 10 MG tablet   losartan (COZAAR) 100 MG tablet   atorvastatin (LIPITOR) 20 MG tablet   Dyslipidemia    Predominantly hypertriglyceridemia, also low HDL. Triglycerides continue increasing despite fish oil, healthy diet efforts. Discussed options - will stop fish oil and start lipitor 20mg  daily. RTC 6 mo f/u.  The 10-year ASCVD risk score Denman Bern DC Montez Hageman., et al., 2013) is: 5.5%   Values used to calculate the score:     Age: 55 years     Sex: Male     Is Non-Hispanic African American: No     Diabetic: Yes     Tobacco smoker: No     Systolic Blood Pressure: 120 mmHg     Is BP treated: Yes     HDL Cholesterol: 27.6 mg/dL     Total Cholesterol: 184 mg/dL  Relevant Medications   atorvastatin (LIPITOR) 20 MG tablet   Diabetes type 2, controlled (HCC)    Followed by endo, A1c above goal - will call and schedule appt as due. Foot exam today.      Relevant Medications   losartan (COZAAR) 100 MG tablet   atorvastatin (LIPITOR)  20 MG tablet    Other Visit Diagnoses    Need for influenza vaccination       Relevant Orders   Flu Vaccine QUAD 36+ mos IM (Completed)   Need for Tdap vaccination       Relevant Orders   Tdap vaccine greater than or equal to 7yo IM (Completed)       Meds ordered this encounter  Medications  . Cholecalciferol (VITAMIN D3) 50000 units TABS    Sig: Take 1 tablet by mouth once a week.    Dispense:  12 tablet    Refill:  1  . allopurinol (ZYLOPRIM) 300 MG tablet    Sig: Take 1 tablet (300 mg total) by mouth daily.    Dispense:  90 tablet    Refill:  3  . amLODipine (NORVASC) 10 MG tablet    Sig: Take 1 tablet (10 mg total) by mouth daily.    Dispense:  90 tablet    Refill:  3  . losartan (COZAAR) 100 MG tablet    Sig: Take 1 tablet (100 mg total) by mouth daily.    Dispense:  90 tablet    Refill:  3  . atorvastatin (LIPITOR) 20 MG tablet    Sig: Take 1 tablet (20 mg total) by mouth daily.    Dispense:  90 tablet    Refill:  3   Orders Placed This Encounter  Procedures  . Flu Vaccine QUAD 36+ mos IM  . Tdap vaccine greater than or equal to 7yo IM  . Ambulatory referral to ENT    Referral Priority:   Routine    Referral Type:   Consultation    Referral Reason:   Specialty Services Required    Requested Specialty:   Otolaryngology    Number of Visits Requested:   1    Follow up plan: Return in about 1 year (around 07/04/2019) for annual exam, prior fasting for blood work.  Eustaquio Boyden, MD

## 2018-07-03 NOTE — Assessment & Plan Note (Signed)
Levels remain low despite daily 2000 IU - will Rx 50k weekly.

## 2018-07-03 NOTE — Assessment & Plan Note (Signed)
Using lubricating tear drops as well as eye mask at night.

## 2018-07-03 NOTE — Assessment & Plan Note (Signed)
Encouraged healthy diet and lifestyle changes to affect sustainable weight loss.  

## 2018-07-03 NOTE — Assessment & Plan Note (Addendum)
Followed by endo, A1c above goal - will call and schedule appt as due. Foot exam today.

## 2018-07-03 NOTE — Assessment & Plan Note (Signed)
Chronic, stable. Continue current regimen. 

## 2018-07-03 NOTE — Assessment & Plan Note (Signed)
Preventative protocols reviewed and updated unless pt declined. Discussed healthy diet and lifestyle.  

## 2018-07-03 NOTE — Patient Instructions (Addendum)
Flu shot today Tdap today. Urinalysis today. Use strainer when you have flank pain.  Change vitamin D replacement to weekly for next several months. When you run out, restart 2000 units daily.  Start lipitor 20mg  daily, ok to stop fish oil for now.  Return in 6 months for follow up visit with fasting labs.  Health Maintenance, Male A healthy lifestyle and preventive care is important for your health and wellness. Ask your health care provider about what schedule of regular examinations is right for you. What should I know about weight and diet? Eat a Healthy Diet  Eat plenty of vegetables, fruits, whole grains, low-fat dairy products, and lean protein.  Do not eat a lot of foods high in solid fats, added sugars, or salt.  Maintain a Healthy Weight Regular exercise can help you achieve or maintain a healthy weight. You should:  Do at least 150 minutes of exercise each week. The exercise should increase your heart rate and make you sweat (moderate-intensity exercise).  Do strength-training exercises at least twice a week.  Watch Your Levels of Cholesterol and Blood Lipids  Have your blood tested for lipids and cholesterol every 5 years starting at 42 years of age. If you are at high risk for heart disease, you should start having your blood tested when you are 42 years old. You may need to have your cholesterol levels checked more often if: ? Your lipid or cholesterol levels are high. ? You are older than 42 years of age. ? You are at high risk for heart disease.  What should I know about cancer screening? Many types of cancers can be detected early and may often be prevented. Lung Cancer  You should be screened every year for lung cancer if: ? You are a current smoker who has smoked for at least 30 years. ? You are a former smoker who has quit within the past 15 years.  Talk to your health care provider about your screening options, when you should start screening, and how often  you should be screened.  Colorectal Cancer  Routine colorectal cancer screening usually begins at 42 years of age and should be repeated every 5-10 years until you are 42 years old. You may need to be screened more often if early forms of precancerous polyps or small growths are found. Your health care provider may recommend screening at an earlier age if you have risk factors for colon cancer.  Your health care provider may recommend using home test kits to check for hidden blood in the stool.  A small camera at the end of a tube can be used to examine your colon (sigmoidoscopy or colonoscopy). This checks for the earliest forms of colorectal cancer.  Prostate and Testicular Cancer  Depending on your age and overall health, your health care provider may do certain tests to screen for prostate and testicular cancer.  Talk to your health care provider about any symptoms or concerns you have about testicular or prostate cancer.  Skin Cancer  Check your skin from head to toe regularly.  Tell your health care provider about any new moles or changes in moles, especially if: ? There is a change in a mole's size, shape, or color. ? You have a mole that is larger than a pencil eraser.  Always use sunscreen. Apply sunscreen liberally and repeat throughout the day.  Protect yourself by wearing long sleeves, pants, a wide-brimmed hat, and sunglasses when outside.  What should I know about  heart disease, diabetes, and high blood pressure?  If you are 73-64 years of age, have your blood pressure checked every 3-5 years. If you are 53 years of age or older, have your blood pressure checked every year. You should have your blood pressure measured twice-once when you are at a hospital or clinic, and once when you are not at a hospital or clinic. Record the average of the two measurements. To check your blood pressure when you are not at a hospital or clinic, you can use: ? An automated blood pressure  machine at a pharmacy. ? A home blood pressure monitor.  Talk to your health care provider about your target blood pressure.  If you are between 69-50 years old, ask your health care provider if you should take aspirin to prevent heart disease.  Have regular diabetes screenings by checking your fasting blood sugar level. ? If you are at a normal weight and have a low risk for diabetes, have this test once every three years after the age of 40. ? If you are overweight and have a high risk for diabetes, consider being tested at a younger age or more often.  A one-time screening for abdominal aortic aneurysm (AAA) by ultrasound is recommended for men aged 41-75 years who are current or former smokers. What should I know about preventing infection? Hepatitis B If you have a higher risk for hepatitis B, you should be screened for this virus. Talk with your health care provider to find out if you are at risk for hepatitis B infection. Hepatitis C Blood testing is recommended for:  Everyone born from [birth year] through 1965.  Anyone with known risk factors for hepatitis C.  Sexually Transmitted Diseases (STDs)  You should be screened each year for STDs including gonorrhea and chlamydia if: ? You are sexually active and are younger than 42 years of age. ? You are older than 42 years of age and your health care provider tells you that you are at risk for this type of infection. ? Your sexual activity has changed since you were last screened and you are at an increased risk for chlamydia or gonorrhea. Ask your health care provider if you are at risk.  Talk with your health care provider about whether you are at high risk of being infected with HIV. Your health care provider may recommend a prescription medicine to help prevent HIV infection.  What else can I do?  Schedule regular health, dental, and eye exams.  Stay current with your vaccines (immunizations).  Do not use any tobacco products,  such as cigarettes, chewing tobacco, and e-cigarettes. If you need help quitting, ask your health care provider.  Limit alcohol intake to no more than 2 drinks per day. One drink equals 12 ounces of beer, 5 ounces of wine, or 1 ounces of hard liquor.  Do not use street drugs.  Do not share needles.  Ask your health care provider for help if you need support or information about quitting drugs.  Tell your health care provider if you often feel depressed.  Tell your health care provider if you have ever been abused or do not feel safe at home. This information is not intended to replace advice given to you by your health care provider. Make sure you discuss any questions you have with your health care provider. Document Released: 04/11/2008 Document Revised: 06/12/2016 Document Reviewed: 07/18/2015 Elsevier Interactive Patient Education  [employer].

## 2018-07-09 ENCOUNTER — Other Ambulatory Visit: Payer: Self-pay | Admitting: Otolaryngology

## 2018-07-09 DIAGNOSIS — Z9989 Dependence on other enabling machines and devices: Secondary | ICD-10-CM | POA: Diagnosis not present

## 2018-07-09 DIAGNOSIS — J342 Deviated nasal septum: Secondary | ICD-10-CM | POA: Diagnosis not present

## 2018-07-09 DIAGNOSIS — J343 Hypertrophy of nasal turbinates: Secondary | ICD-10-CM | POA: Diagnosis not present

## 2018-07-09 DIAGNOSIS — G4733 Obstructive sleep apnea (adult) (pediatric): Secondary | ICD-10-CM | POA: Diagnosis not present

## 2018-07-15 ENCOUNTER — Telehealth: Payer: Self-pay | Admitting: Family Medicine

## 2018-07-15 NOTE — Telephone Encounter (Signed)
Placed form in Dr. G's box.  

## 2018-07-15 NOTE — Telephone Encounter (Signed)
Pt dropped off physicians results form to be filled out. In dr Reece AgarG [facility] up front  Please fax when completed and let pt know

## 2018-07-15 NOTE — Telephone Encounter (Signed)
Faxed pt's form. Made copies to be scanned and sent to billing. Mailed original form to pt.  Spoke with pt informing him the form was faxed and I am mailing the original to him for his records.  Also, there is a $5.00 fee to have form completed.  Pt verbalizes understanding and expresses his thanks.

## 2018-07-15 NOTE — Telephone Encounter (Signed)
Signed and in Lisa's box.  

## 2018-07-18 ENCOUNTER — Other Ambulatory Visit: Payer: Self-pay | Admitting: Family Medicine

## 2018-08-20 NOTE — Pre-Procedure Instructions (Signed)
Christia Coaxum  08/20/2018      [id number] [place], [place] - [address] AT [facility] OF [name] [place] [address] Phone: [phone] Fax: [phone]    Your procedure is scheduled on Friday, Nov. 1st   Report to [facility] Admitting at 0800 AM             (posted surgery time 10a - 11a)   Call this number if you have problems the morning of surgery:  [phone]   Remember:  Do not eat any foods or drink any liquids after midnight, Thursday.   7 days prior to surgery, STOP TAKING any Vitamins, Fish Oil, Herbal Supplements, Anti-inflammatories, Blood Thinners.                  Take these medicines the morning of surgery with A SIP OF WATER : Amlodipine    Do not wear jewelry - no rings or watches.  Do not wear lotions, colognes, or deodorant.             Men may shave face and neck.   Do not bring valuables to the hospital.  [facility] is not responsible for any belongings or valuables.  Contacts, dentures or bridgework may not be worn into surgery.  Leave your suitcase in the car.  After surgery it may be brought to your room.  For patients admitted to the hospital, discharge time will be determined by your treatment team.  Please read over the following fact sheets that you were given. Pain Booklet and Surgical Site Infection Prevention         How to Manage Your Diabetes Before and After Surgery  Why is it important to control my blood sugar before and after surgery? . Improving blood sugar levels before and after surgery helps healing and can limit problems. . A way of improving blood sugar control is eating a healthy diet by: o  Eating less sugar and carbohydrates o  Increasing activity/exercise o  Talking with your doctor about reaching your blood sugar goals o  . High blood sugars (greater than 180 mg/dL) can raise your risk of infections and slow your recovery, so you will  need to focus on controlling your diabetes during the weeks before surgery. .  . Make sure that the doctor who takes care of your diabetes knows about your planned surgery including the date and location.  How do I manage my blood sugar before surgery? . Check your blood sugar at least 4 times a day, starting 2 days before surgery, to make sure that the level is not too high or low. o Check your blood sugar the morning of your surgery when you wake up and every 2 hours until you get to the Short Stay unit. o  . If your blood sugar is less than 70 mg/dL, you will need to treat for low blood sugar: o Do not take insulin. o Treat a low blood sugar (less than 70 mg/dL) with  cup of clear juice (cranberry or apple), 4 glucose tablets, OR glucose gel. o NO ORANGE JUICE o  Recheck blood sugar in 15 minutes after treatment (to make sure it is greater than 70 mg/dL). If your blood sugar is not greater than 70 mg/dL on recheck, call [phone] o  for further instructions. . Report your blood sugar to the short stay nurse when you get to Short Stay.  Marland Kitchen  If you are admitted to the hospital after surgery: o Your blood sugar will be checked by the staff and you will probably be given insulin after surgery (instead of oral diabetes medicines) to make sure you have good blood sugar levels. o The goal for blood sugar control after surgery is 80-180 mg/dL.  WHAT DO I DO ABOUT MY DIABETES MEDICATION?  Marland Kitchen Do not take oral diabetes medicines (pills) the morning of surgery.   Other Instructions:       Patient Signature:  Date:   Nurse Signature:  Date:      [place]- Preparing For Surgery  Before surgery, you can play an important role. Because skin is not sterile, your skin needs to be as free of germs as possible. You can reduce the number of germs on your skin by washing with CHG (chlorahexidine gluconate) Soap before surgery.  CHG is an antiseptic cleaner which kills germs and bonds with the  skin to continue killing germs even after washing.    Oral Hygiene is also important to reduce your risk of infection.    Remember - BRUSH YOUR TEETH THE MORNING OF SURGERY WITH YOUR REGULAR TOOTHPASTE  Please do not use if you have an allergy to CHG or antibacterial soaps. If your skin becomes reddened/irritated stop using the CHG.  Do not shave (including legs and underarms) for at least 48 hours prior to first CHG shower. It is OK to shave your face.  Please follow these instructions carefully.   1. Shower the NIGHT BEFORE SURGERY and the MORNING OF SURGERY with CHG.   2. If you chose to wash your hair, wash your hair first as usual with your normal shampoo.  3. After you shampoo, rinse your hair and body thoroughly to remove the shampoo.  4. Use CHG as you would any other liquid soap. You can apply CHG directly to the skin and wash gently with a scrungie or a clean washcloth.   5. Apply the CHG Soap to your body ONLY FROM THE NECK DOWN.  Do not use on open wounds or open sores. Avoid contact with your eyes, ears, mouth and genitals (private parts). Wash Face and genitals (private parts)  with your normal soap.  6. Wash thoroughly, paying special attention to the area where your surgery will be performed.  7. Thoroughly rinse your body with warm water from the neck down.  8. DO NOT shower/wash with your normal soap after using and rinsing off the CHG Soap.  9. Pat yourself dry with a CLEAN TOWEL.  10. Wear CLEAN PAJAMAS to bed the night before surgery, wear comfortable clothes the morning of surgery  11. Place CLEAN SHEETS on your bed the night of your first shower and DO NOT SLEEP WITH PETS.  Day of Surgery:  Do not apply any deodorants/lotions.  Please wear clean clothes to the hospital/surgery center.   Remember to brush your teeth WITH YOUR REGULAR TOOTHPASTE.

## 2018-08-21 ENCOUNTER — Encounter (HOSPITAL_COMMUNITY): Payer: Self-pay

## 2018-08-21 ENCOUNTER — Encounter (HOSPITAL_COMMUNITY)
Admission: RE | Admit: 2018-08-21 | Discharge: 2018-08-21 | Disposition: A | Discharge status: Home / Self Care | Payer: BLUE CROSS/BLUE SHIELD | Source: Ambulatory Visit | Attending: Otolaryngology | Admitting: Otolaryngology

## 2018-08-21 ENCOUNTER — Other Ambulatory Visit: Payer: Self-pay

## 2018-08-21 DIAGNOSIS — I10 Essential (primary) hypertension: Secondary | ICD-10-CM | POA: Insufficient documentation

## 2018-08-21 DIAGNOSIS — E119 Type 2 diabetes mellitus without complications: Secondary | ICD-10-CM | POA: Insufficient documentation

## 2018-08-21 DIAGNOSIS — K589 Irritable bowel syndrome without diarrhea: Secondary | ICD-10-CM | POA: Insufficient documentation

## 2018-08-21 DIAGNOSIS — Z79899 Other long term (current) drug therapy: Secondary | ICD-10-CM | POA: Insufficient documentation

## 2018-08-21 DIAGNOSIS — E785 Hyperlipidemia, unspecified: Secondary | ICD-10-CM | POA: Insufficient documentation

## 2018-08-21 DIAGNOSIS — G4733 Obstructive sleep apnea (adult) (pediatric): Secondary | ICD-10-CM | POA: Insufficient documentation

## 2018-08-21 DIAGNOSIS — M109 Gout, unspecified: Secondary | ICD-10-CM | POA: Insufficient documentation

## 2018-08-21 DIAGNOSIS — K219 Gastro-esophageal reflux disease without esophagitis: Secondary | ICD-10-CM | POA: Diagnosis not present

## 2018-08-21 DIAGNOSIS — E669 Obesity, unspecified: Secondary | ICD-10-CM | POA: Insufficient documentation

## 2018-08-21 DIAGNOSIS — Z87891 Personal history of nicotine dependence: Secondary | ICD-10-CM | POA: Diagnosis not present

## 2018-08-21 DIAGNOSIS — Z01818 Encounter for other preprocedural examination: Secondary | ICD-10-CM | POA: Insufficient documentation

## 2018-08-21 DIAGNOSIS — Z87442 Personal history of urinary calculi: Secondary | ICD-10-CM | POA: Insufficient documentation

## 2018-08-21 DIAGNOSIS — Z6835 Body mass index (BMI) 35.0-35.9, adult: Secondary | ICD-10-CM | POA: Diagnosis not present

## 2018-08-21 DIAGNOSIS — Z7984 Long term (current) use of oral hypoglycemic drugs: Secondary | ICD-10-CM | POA: Diagnosis not present

## 2018-08-21 HISTORY — DX: Personal history of urinary calculi: Z87.442

## 2018-08-21 HISTORY — DX: Family history of other specified conditions: Z84.89

## 2018-08-21 LAB — CBC
HEMATOCRIT: 41.9 % (ref 39.0–52.0)
HEMOGLOBIN: 13.9 g/dL (ref 13.0–17.0)
MCH: 27.3 pg (ref 26.0–34.0)
MCHC: 33.2 g/dL (ref 30.0–36.0)
MCV: 82.3 fL (ref 80.0–100.0)
Platelets: 223 10*3/uL (ref 150–400)
RBC: 5.09 MIL/uL (ref 4.22–5.81)
RDW: 12.8 % (ref 11.5–15.5)
WBC: 7.2 10*3/uL (ref 4.0–10.5)
nRBC: 0 % (ref 0.0–0.2)

## 2018-08-21 LAB — BASIC METABOLIC PANEL
ANION GAP: 8 (ref 5–15)
BUN: 10 mg/dL (ref 6–20)
CO2: 27 mmol/L (ref 22–32)
Calcium: 10 mg/dL (ref 8.9–10.3)
Chloride: 103 mmol/L (ref 98–111)
Creatinine, Ser: 0.7 mg/dL (ref 0.61–1.24)
GFR calc Af Amer: >60 mL/min (ref >60)
GFR calc non Af Amer: >60 mL/min (ref >60)
GLUCOSE: 257 mg/dL — AB (ref 70–99)
POTASSIUM: 3.8 mmol/L (ref 3.5–5.1)
Sodium: 138 mmol/L (ref 135–145)

## 2018-08-21 LAB — GLUCOSE, CAPILLARY: GLUCOSE-CAPILLARY: 282 mg/dL — AB (ref 70–99)

## 2018-08-21 NOTE — Progress Notes (Signed)
PCP - Dr. Sharen Hones Endocrinologist- Dr. Tedd Sias Cardiologist - denies  Chest x-ray - N/A EKG - 08/21/18 Stress Test - patient states he had a exercise tolerance test in 2005. Test was in [place]- patient does not know who ordered or where it was done. ECHO - denies Cardiac Cath - denies  Sleep Study - last sleep study 2002 CPAP - does not use  Fasting Blood Sugar - 130's Checks Blood Sugar 3-4 times a week  Patients CBG at PAT appointment 282. Orvan Seen made aware. Last A1C 06/30/18 was 7.2.   Aspirin Instructions: patient instructed to hold ASA/NSAID's 7 days prior to DOS.  Anesthesia review: yes- elevated CBG  Patient denies shortness of breath, fever, cough and chest pain at PAT appointment   Patient verbalized understanding of instructions that were given to them at the PAT appointment. Patient was also instructed that they will need to review over the PAT instructions again at home before surgery.

## 2018-08-24 NOTE — Anesthesia Preprocedure Evaluation (Addendum)
Anesthesia Evaluation  Patient identified by MRN, date of birth, ID band Patient awake    Reviewed: Allergy & Precautions, H&P , NPO status , Patient's Chart, lab work & pertinent test results  Airway Mallampati: II  TM Distance: >3 FB Neck ROM: Full    Dental no notable dental hx. (+) Teeth Intact, Dental Advisory Given   Pulmonary sleep apnea , former smoker,    Pulmonary exam normal breath sounds clear to auscultation       Cardiovascular hypertension, Pt. on medications  Rhythm:Regular Rate:Normal     Neuro/Psych negative neurological ROS  negative psych ROS   GI/Hepatic Neg liver ROS, GERD  Controlled,  Endo/Other  diabetes, Type 2, Oral Hypoglycemic AgentsMorbid obesity  Renal/GU negative Renal ROS  negative genitourinary   Musculoskeletal   Abdominal   Peds  Hematology negative hematology ROS (+)   Anesthesia Other Findings   Reproductive/Obstetrics negative OB ROS                           Anesthesia Physical Anesthesia Plan  ASA: III  Anesthesia Plan: General   Post-op Pain Management:    Induction: Intravenous  PONV Risk Score and Plan: 3 and Ondansetron, Dexamethasone and Midazolam  Airway Management Planned: Oral ETT  Additional Equipment:   Intra-op Plan:   Post-operative Plan: Extubation in OR  Informed Consent: I have reviewed the patients History and Physical, chart, labs and discussed the procedure including the risks, benefits and alternatives for the proposed anesthesia with the patient or authorized representative who has indicated his/her understanding and acceptance.   Dental advisory given  Plan Discussed with: CRNA  Anesthesia Plan Comments: (See PAT note written 08/24/2018 by Shonna Chock, PA-C. )       Anesthesia Quick Evaluation

## 2018-08-24 NOTE — Progress Notes (Signed)
Anesthesia Chart Review:  Case:  [id number] Date/Time:  08/28/18 0945   Procedure:  NASAL SEPTOPLASTY WITH TURBINATE REDUCTION (Bilateral )   Anesthesia type:  General   Pre-op diagnosis:  Obstructive sleep apnea of adult   Location:  [facility] OR ROOM 09 / [facility] OR   Surgeon:  [name], MD      DISCUSSION: Patient is a 42 year old male scheduled for the above procedure. History includes former smoker (quit '05), HTN, GERD, IBS, DM2, dyslipidemia, OSA (intolerant to CPAP). BMI is consistent with obesity.  Non-fasting CBG at PAT 282. Reported home fasting CBGs ~ 130's. A1c 7.2 on 06/30/18. He will get a fasting CBG on the morning of surgery.   VS: BP (!) 151/96   Pulse 96   Temp 36.7 C (Oral)   Resp 18   Ht 6\' 2"  (1.88 m)   Wt 125.1 kg   SpO2 99%   BMI 35.41 kg/m   PROVIDERS: Eustaquio Boyden, MD is PCP Carlena Sax, MD is endocrinologist [facility] Everywhere). Last visit 07/22/17.   LABS: Preoperative labs noted. Non-fasting glucose 257. Average glucose ~ 160 by 06/30/18 A1c and reports fasting CBG in the 130 range.  (all labs ordered are listed, but only abnormal results are displayed)  Labs Reviewed  GLUCOSE, CAPILLARY - Abnormal; Notable for the following components:      Result Value   Glucose-Capillary 282 (*)    All other components within normal limits  BASIC METABOLIC PANEL - Abnormal; Notable for the following components:   Glucose, Bld 257 (*)    All other components within normal limits  CBC    EKG: 08/21/18: NSR.   CV: He reported a remote history of ETT in 2005 in [place].  Past Medical History:  Diagnosis Date  . Diabetes type 2, controlled (HCC)    Dr Tedd Sias  . Dyslipidemia 05/25/2014  . Family history of adverse reaction to anesthesia    Mother has postoperatve N&V  . GERD 05/11/2007  . Gout, unspecified 09/05/2009  . History of kidney stones    h/o kidney stones "regularly over the past 5 years"  . HTN (hypertension) 09/08/2008  . IBS  01/08/2008  . SLEEP APNEA 01/08/2008   cannot tolerate CPAP  . THYROID STIMULATING HORMONE, ABNORMAL 10/10/2008    Past Surgical History:  Procedure Laterality Date  . COLONOSCOPY    . HERNIA REPAIR    . UPPER GASTROINTESTINAL ENDOSCOPY      MEDICATIONS: . allopurinol (ZYLOPRIM) 300 MG tablet  . amLODipine (NORVASC) 10 MG tablet  . Ascorbic Acid (VITAMIN C) 500 MG CAPS  . atorvastatin (LIPITOR) 20 MG tablet  . Cholecalciferol (VITAMIN D) 2000 units CAPS  . Cholecalciferol (VITAMIN D3) 50000 units TABS  . Hypromellose (ARTIFICIAL TEARS OP)  . losartan (COZAAR) 100 MG tablet  . metFORMIN (GLUCOPHAGE) 500 MG tablet  . vitamin B-12 (V-R VITAMIN B-12) 500 MCG tablet   No current facility-administered medications for this encounter.     Velna Ochs [facility] Short Stay Center/Anesthesiology Phone [phone] 08/24/2018 12:32 PM

## 2018-08-28 ENCOUNTER — Observation Stay (HOSPITAL_COMMUNITY)
Admission: RE | Admit: 2018-08-28 | Discharge: 2018-08-29 | Disposition: A | Discharge status: Home / Self Care | Payer: BLUE CROSS/BLUE SHIELD | Source: Ambulatory Visit | Attending: Otolaryngology | Admitting: Otolaryngology

## 2018-08-28 ENCOUNTER — Encounter (HOSPITAL_COMMUNITY): Payer: Self-pay

## 2018-08-28 ENCOUNTER — Other Ambulatory Visit: Payer: Self-pay

## 2018-08-28 ENCOUNTER — Ambulatory Visit (HOSPITAL_COMMUNITY): Payer: BLUE CROSS/BLUE SHIELD | Admitting: Vascular Surgery

## 2018-08-28 ENCOUNTER — Encounter (HOSPITAL_COMMUNITY)
Admission: RE | Disposition: A | Discharge status: Home / Self Care | Payer: Self-pay | Source: Ambulatory Visit | Attending: Otolaryngology

## 2018-08-28 ENCOUNTER — Ambulatory Visit (HOSPITAL_COMMUNITY): Payer: BLUE CROSS/BLUE SHIELD | Admitting: Anesthesiology

## 2018-08-28 DIAGNOSIS — Z87891 Personal history of nicotine dependence: Secondary | ICD-10-CM | POA: Insufficient documentation

## 2018-08-28 DIAGNOSIS — Z87442 Personal history of urinary calculi: Secondary | ICD-10-CM | POA: Diagnosis not present

## 2018-08-28 DIAGNOSIS — G4733 Obstructive sleep apnea (adult) (pediatric): Secondary | ICD-10-CM | POA: Insufficient documentation

## 2018-08-28 DIAGNOSIS — I10 Essential (primary) hypertension: Secondary | ICD-10-CM | POA: Insufficient documentation

## 2018-08-28 DIAGNOSIS — E119 Type 2 diabetes mellitus without complications: Secondary | ICD-10-CM | POA: Insufficient documentation

## 2018-08-28 DIAGNOSIS — Z7984 Long term (current) use of oral hypoglycemic drugs: Secondary | ICD-10-CM | POA: Diagnosis not present

## 2018-08-28 DIAGNOSIS — J342 Deviated nasal septum: Secondary | ICD-10-CM | POA: Diagnosis not present

## 2018-08-28 DIAGNOSIS — M109 Gout, unspecified: Secondary | ICD-10-CM | POA: Insufficient documentation

## 2018-08-28 DIAGNOSIS — Z79899 Other long term (current) drug therapy: Secondary | ICD-10-CM | POA: Insufficient documentation

## 2018-08-28 DIAGNOSIS — J343 Hypertrophy of nasal turbinates: Secondary | ICD-10-CM | POA: Diagnosis not present

## 2018-08-28 DIAGNOSIS — Z880 Allergy status to penicillin: Secondary | ICD-10-CM | POA: Diagnosis not present

## 2018-08-28 HISTORY — PX: NASAL SEPTOPLASTY W/ TURBINOPLASTY: SHX2070

## 2018-08-28 LAB — GLUCOSE, CAPILLARY
GLUCOSE-CAPILLARY: 201 mg/dL — AB (ref 70–99)
GLUCOSE-CAPILLARY: 181 mg/dL — AB (ref 70–99)
GLUCOSE-CAPILLARY: 442 mg/dL — AB (ref 70–99)
GLUCOSE-CAPILLARY: 371 mg/dL — AB (ref 70–99)
Glucose-Capillary: 178 mg/dL — ABNORMAL HIGH (ref 70–99)
Glucose-Capillary: 521 mg/dL (ref 70–99)

## 2018-08-28 SURGERY — SEPTOPLASTY, NOSE, WITH NASAL TURBINATE REDUCTION
Anesthesia: General | Site: Nose | Laterality: Bilateral

## 2018-08-28 MED ORDER — MORPHINE SULFATE (PF) 2 MG/ML IV SOLN: 2.0000 mg | INTRAVENOUS | Status: DC | PRN

## 2018-08-28 MED ORDER — MIDAZOLAM HCL 2 MG/2ML IJ SOLN
INTRAMUSCULAR | Status: AC
  Filled 2018-08-28: qty 2

## 2018-08-28 MED ORDER — ROCURONIUM BROMIDE 10 MG/ML (PF) SYRINGE
PREFILLED_SYRINGE | INTRAVENOUS | Status: DC | PRN
  Administered 2018-08-28: 20 mg via INTRAVENOUS
  Administered 2018-08-28: 50 mg via INTRAVENOUS

## 2018-08-28 MED ORDER — CHLORHEXIDINE GLUCONATE CLOTH 2 % EX PADS: 6.0000 | MEDICATED_PAD | Freq: Once | CUTANEOUS | Status: DC

## 2018-08-28 MED ORDER — ONDANSETRON HCL 4 MG/2ML IJ SOLN
INTRAMUSCULAR | Status: DC | PRN
  Administered 2018-08-28: 4 mg via INTRAVENOUS

## 2018-08-28 MED ORDER — INSULIN ASPART 100 UNIT/ML ~~LOC~~ SOLN: 0.0000 [IU] | Freq: Three times a day (TID) | SUBCUTANEOUS | Status: DC

## 2018-08-28 MED ORDER — DEXMEDETOMIDINE HCL 200 MCG/2ML IV SOLN
INTRAVENOUS | Status: DC | PRN
  Administered 2018-08-28: 12 ug via INTRAVENOUS

## 2018-08-28 MED ORDER — EPHEDRINE 5 MG/ML INJ
INTRAVENOUS | Status: AC
  Filled 2018-08-28: qty 10

## 2018-08-28 MED ORDER — DEXAMETHASONE SODIUM PHOSPHATE 10 MG/ML IJ SOLN
INTRAMUSCULAR | Status: DC | PRN
  Administered 2018-08-28: 4 mg via INTRAVENOUS

## 2018-08-28 MED ORDER — AMLODIPINE BESYLATE 10 MG PO TABS
10.0000 mg | ORAL_TABLET | Freq: Every day | ORAL | Status: DC
  Administered 2018-08-29: 10 mg via ORAL
  Filled 2018-08-28: qty 1

## 2018-08-28 MED ORDER — MIDAZOLAM HCL 5 MG/5ML IJ SOLN
INTRAMUSCULAR | Status: DC | PRN
  Administered 2018-08-28: 2 mg via INTRAVENOUS

## 2018-08-28 MED ORDER — ONDANSETRON 4 MG PO TBDP: 4.0000 mg | ORAL_TABLET | Freq: Four times a day (QID) | ORAL | Status: DC | PRN

## 2018-08-28 MED ORDER — HYDROMORPHONE HCL 1 MG/ML IJ SOLN: 0.2500 mg | INTRAMUSCULAR | Status: DC | PRN

## 2018-08-28 MED ORDER — CLINDAMYCIN HCL 300 MG PO CAPS: 300.0000 mg | ORAL_CAPSULE | Freq: Three times a day (TID) | ORAL | 0 refills | Status: AC

## 2018-08-28 MED ORDER — 0.9 % SODIUM CHLORIDE (POUR BTL) OPTIME
TOPICAL | Status: DC | PRN
  Administered 2018-08-28: 1000 mL

## 2018-08-28 MED ORDER — OXYMETAZOLINE HCL 0.05 % NA SOLN
NASAL | Status: AC
  Filled 2018-08-28: qty 15

## 2018-08-28 MED ORDER — FENTANYL CITRATE (PF) 250 MCG/5ML IJ SOLN
INTRAMUSCULAR | Status: AC
  Filled 2018-08-28: qty 5

## 2018-08-28 MED ORDER — PHENYLEPHRINE 40 MCG/ML (10ML) SYRINGE FOR IV PUSH (FOR BLOOD PRESSURE SUPPORT)
PREFILLED_SYRINGE | INTRAVENOUS | Status: DC | PRN
  Administered 2018-08-28: 40 ug via INTRAVENOUS
  Administered 2018-08-28: 80 ug via INTRAVENOUS

## 2018-08-28 MED ORDER — LIDOCAINE 2% (20 MG/ML) 5 ML SYRINGE
INTRAMUSCULAR | Status: DC | PRN
  Administered 2018-08-28: 80 mg via INTRAVENOUS

## 2018-08-28 MED ORDER — SUGAMMADEX SODIUM 200 MG/2ML IV SOLN
INTRAVENOUS | Status: DC | PRN
  Administered 2018-08-28: 300 mg via INTRAVENOUS

## 2018-08-28 MED ORDER — CLINDAMYCIN HCL 300 MG PO CAPS
300.0000 mg | ORAL_CAPSULE | Freq: Three times a day (TID) | ORAL | Status: DC
  Administered 2018-08-28 – 2018-08-29 (×3): 300 mg via ORAL
  Filled 2018-08-28 (×3): qty 1

## 2018-08-28 MED ORDER — LIDOCAINE-EPINEPHRINE 1 %-1:100000 IJ SOLN
INTRAMUSCULAR | Status: AC
  Filled 2018-08-28: qty 1

## 2018-08-28 MED ORDER — SODIUM CHLORIDE 0.9 % IV SOLN
INTRAVENOUS | Status: DC | PRN
  Administered 2018-08-28: 20 ug/min via INTRAVENOUS

## 2018-08-28 MED ORDER — OXYMETAZOLINE HCL 0.05 % NA SOLN
NASAL | Status: DC | PRN
  Administered 2018-08-28: 1 via TOPICAL

## 2018-08-28 MED ORDER — DEXTROSE-NACL 5-0.45 % IV SOLN
INTRAVENOUS | Status: DC
  Administered 2018-08-28: 14:00:00 via INTRAVENOUS

## 2018-08-28 MED ORDER — ACETAMINOPHEN 325 MG PO TABS
650.0000 mg | ORAL_TABLET | ORAL | Status: DC | PRN
  Administered 2018-08-28 (×2): 650 mg via ORAL
  Filled 2018-08-28 (×2): qty 2

## 2018-08-28 MED ORDER — HYDROCODONE-ACETAMINOPHEN 5-325 MG PO TABS: 1.0000 | ORAL_TABLET | ORAL | Status: DC | PRN

## 2018-08-28 MED ORDER — BACITRACIN ZINC 500 UNIT/GM EX OINT
TOPICAL_OINTMENT | CUTANEOUS | Status: AC
  Filled 2018-08-28: qty 28.35

## 2018-08-28 MED ORDER — LIDOCAINE-EPINEPHRINE 1 %-1:100000 IJ SOLN
INTRAMUSCULAR | Status: DC | PRN
  Administered 2018-08-28: 20 mL

## 2018-08-28 MED ORDER — METFORMIN HCL 500 MG PO TABS
1000.0000 mg | ORAL_TABLET | Freq: Every day | ORAL | Status: DC
  Administered 2018-08-29: 1000 mg via ORAL
  Filled 2018-08-28: qty 2

## 2018-08-28 MED ORDER — LACTATED RINGERS IV SOLN
INTRAVENOUS | Status: DC
  Administered 2018-08-28 (×2): via INTRAVENOUS

## 2018-08-28 MED ORDER — PROPOFOL 10 MG/ML IV BOLUS
INTRAVENOUS | Status: DC | PRN
  Administered 2018-08-28: 200 mg via INTRAVENOUS

## 2018-08-28 MED ORDER — ONDANSETRON HCL 4 MG/2ML IJ SOLN: 4.0000 mg | Freq: Four times a day (QID) | INTRAMUSCULAR | Status: DC | PRN

## 2018-08-28 MED ORDER — INSULIN ASPART 100 UNIT/ML ~~LOC~~ SOLN
0.0000 [IU] | Freq: Every day | SUBCUTANEOUS | Status: DC
  Administered 2018-08-28: 3 [IU] via SUBCUTANEOUS

## 2018-08-28 MED ORDER — EPHEDRINE SULFATE-NACL 50-0.9 MG/10ML-% IV SOSY
PREFILLED_SYRINGE | INTRAVENOUS | Status: DC | PRN
  Administered 2018-08-28: 5 mg via INTRAVENOUS

## 2018-08-28 MED ORDER — FENTANYL CITRATE (PF) 100 MCG/2ML IJ SOLN: INTRAMUSCULAR | Status: DC | PRN

## 2018-08-28 MED ORDER — BACITRACIN ZINC 500 UNIT/GM EX OINT
TOPICAL_OINTMENT | CUTANEOUS | Status: DC | PRN
  Administered 2018-08-28: 1 via TOPICAL

## 2018-08-28 MED ORDER — PROPOFOL 10 MG/ML IV BOLUS
INTRAVENOUS | Status: AC
  Filled 2018-08-28: qty 20

## 2018-08-28 MED ORDER — INSULIN ASPART 100 UNIT/ML ~~LOC~~ SOLN
0.0000 [IU] | Freq: Three times a day (TID) | SUBCUTANEOUS | Status: DC
  Administered 2018-08-28: 9 [IU] via SUBCUTANEOUS
  Administered 2018-08-29: 5 [IU] via SUBCUTANEOUS

## 2018-08-28 MED ORDER — ALLOPURINOL 300 MG PO TABS: 300.0000 mg | ORAL_TABLET | Freq: Every day | ORAL | Status: DC

## 2018-08-28 MED ORDER — FENTANYL CITRATE (PF) 100 MCG/2ML IJ SOLN
INTRAMUSCULAR | Status: DC | PRN
  Administered 2018-08-28: 25 ug via INTRAVENOUS
  Administered 2018-08-28: 150 ug via INTRAVENOUS

## 2018-08-28 SURGICAL SUPPLY — 35 items
ATTRACTOMAT 16X20 MAGNETIC DRP (DRAPES) IMPLANT
BLADE ROTATE RAD 40 4 M4 (BLADE) IMPLANT
BLADE ROTATE TRICUT 4X13 M4 (BLADE) IMPLANT
BLADE SURG 15 STRL LF DISP TIS (BLADE) IMPLANT
BLADE SURG 15 STRL SS (BLADE)
CANISTER SUCT 3000ML PPV (MISCELLANEOUS) ×2 IMPLANT
COAGULATOR SUCT SWTCH 10FR 6 (ELECTROSURGICAL) ×2 IMPLANT
COVER WAND RF STERILE (DRAPES) IMPLANT
CRADLE DONUT ADULT HEAD (MISCELLANEOUS) ×2 IMPLANT
DRAPE HALF SHEET 40X57 (DRAPES) IMPLANT
DRESSING TELFA 8X10 (GAUZE/BANDAGES/DRESSINGS) ×2 IMPLANT
ELECT COATED BLADE 2.86 ST (ELECTRODE) IMPLANT
ELECT REM PT RETURN 9FT ADLT (ELECTROSURGICAL) ×2
ELECTRODE REM PT RTRN 9FT ADLT (ELECTROSURGICAL) ×1 IMPLANT
GLOVE ECLIPSE 7.5 STRL STRAW (GLOVE) ×2 IMPLANT
GOWN BRE IMP SLV AUR LG STRL (GOWN DISPOSABLE) IMPLANT
GOWN STRL REUS W/ TWL LRG LVL3 (GOWN DISPOSABLE) ×1 IMPLANT
GOWN STRL REUS W/ TWL XL LVL3 (GOWN DISPOSABLE) ×1 IMPLANT
GOWN STRL REUS W/TWL LRG LVL3 (GOWN DISPOSABLE) ×1
GOWN STRL REUS W/TWL XL LVL3 (GOWN DISPOSABLE) ×1
KIT BASIN OR (CUSTOM PROCEDURE TRAY) ×2 IMPLANT
KIT TURNOVER KIT B (KITS) ×2 IMPLANT
NEEDLE HYPO 25GX1X1/2 BEV (NEEDLE) IMPLANT
NS IRRIG 1000ML POUR BTL (IV SOLUTION) ×2 IMPLANT
PAD ARMBOARD 7.5X6 YLW CONV (MISCELLANEOUS) ×2 IMPLANT
PATTIES SURGICAL .5 X3 (DISPOSABLE) ×2 IMPLANT
PENCIL FOOT CONTROL (ELECTRODE) IMPLANT
SOLUTION ANTI FOG 6CC (MISCELLANEOUS) ×2 IMPLANT
SUT CHROMIC 4 0 PS 2 18 (SUTURE) ×2 IMPLANT
SUT ETHILON 3 0 PS 1 (SUTURE) ×2 IMPLANT
SUT PLAIN 4 0 ~~LOC~~ 1 (SUTURE) ×2 IMPLANT
TRACKER ENT INSTRUMENT (MISCELLANEOUS) IMPLANT
TRACKER ENT PATIENT (MISCELLANEOUS) IMPLANT
TRAY ENT MC OR (CUSTOM PROCEDURE TRAY) ×2 IMPLANT
TUBING STRAIGHTSHOT EPS 5PK (TUBING) IMPLANT

## 2018-08-28 NOTE — Anesthesia Procedure Notes (Signed)
Procedure Name: Intubation Performed by: Milford Cage, CRNA Pre-anesthesia Checklist: Patient identified, Emergency Drugs available, Suction available and Patient being monitored Patient Re-evaluated:Patient Re-evaluated prior to induction Oxygen Delivery Method: Circle System Utilized Preoxygenation: Pre-oxygenation with 100% oxygen Induction Type: IV induction Ventilation: Mask ventilation without difficulty and Oral airway inserted - appropriate to patient size Laryngoscope Size: Mac and 4 Grade View: Grade I Tube type: Oral Tube size: 8.0 mm Number of attempts: 1 Airway Equipment and Method: Stylet and Oral airway Placement Confirmation: ETT inserted through vocal cords under direct vision,  positive ETCO2 and breath sounds checked- equal and bilateral Secured at: 24 cm Tube secured with: Tape Dental Injury: Teeth and Oropharynx as per pre-operative assessment

## 2018-08-28 NOTE — Op Note (Signed)
Preop/postop diagnoses: Deviated and turbinate hypertrophy Procedure: Septoplasty and submucous resection inferior turbinates Anesthesia: Gen. Estimated blood loss: Less than 5 mL Indications: 42 year old with a chronic problem nasal obstruction. He's had sleep apnea. He has difficulty with breathing through the nose and once to proceed with nasal surgery to improve the airflow and possibly improve his sleep apnea. He was informed risk and benefits of the procedure and options were discussed all questions are answered and consent was obtained. Procedure: Patient taken to the operating room placed in the supine position after general endotracheal tube anesthesia was placed in the supine position prepped and draped in the usual sterile manner. The septum and inferior turbinates were injected with 1% lidocaine with 1 100,000 epinephrine. A left hemitransfixion incision was made raised a mucoperichondrial and ostial flap. The cartilage was divided about 2 cm posterior the caudal strut and the deviated portion of the cartilage was remove the Therapist, nutritional. The Jansen-Middleton open forceps are used to remove the deviated portion of the bone and a inferior base spur was removed with a 4 mm osteotome. This corrected the septal flexion. The turbinates are infractured midline incision made with a 15 blade mucosal flap elevated superiorly and the inferior mucosa and bone were removed the turbinate scissors. Edge was cauterized with suction cautery and both flaps were laid back down over the raw surface in both turbinates were outfractured. Nasopharynx suctioned out of all blood and debris. There was good hemostasis. The hemitransfixion incision was closed with interrupted 4-0 chromic and a 40 plain gut quilting stitch placed to the septum. Oxymetazoline pledgets were placed along the edge of the turbinates and tied loosely across the columella to be removed in recovery room. The patient was awakened brought to recovery  in stable condition counts correct

## 2018-08-28 NOTE — Anesthesia Postprocedure Evaluation (Signed)
Anesthesia Post Note  Patient: Kyle [name]  Procedure(s) Performed: NASAL SEPTOPLASTY WITH TURBINATE REDUCTION (Bilateral Nose)     Patient location during evaluation: PACU Anesthesia Type: General Level of consciousness: awake and alert Pain management: pain level controlled Vital Signs Assessment: post-procedure vital signs reviewed and stable Respiratory status: spontaneous breathing, nonlabored ventilation and respiratory function stable Cardiovascular status: blood pressure returned to baseline and stable Postop Assessment: no apparent nausea or vomiting Anesthetic complications: no    Last Vitals:  Vitals:   08/28/18 1135 08/28/18 1152  BP: (!) 136/55 (!) 140/96  Pulse: 95 94  Resp: 15 14  Temp:  36.8 C  SpO2: 96% 96%    Last Pain:  Vitals:   08/28/18 1152  TempSrc:   PainSc: 0-No pain                 Analy Bassford,W. EDMOND

## 2018-08-28 NOTE — Transfer of Care (Signed)
Immediate Anesthesia Transfer of Care Note  Patient: Kyle [name]  Procedure(s) Performed: NASAL SEPTOPLASTY WITH TURBINATE REDUCTION (Bilateral Nose)  Patient Location: PACU  Anesthesia Type:General  Level of Consciousness: awake, alert  and oriented  Airway & Oxygen Therapy: Patient Spontanous Breathing and Patient connected to face mask oxygen  Post-op Assessment: Report given to RN and Post -op Vital signs reviewed and stable  Post vital signs: Reviewed and stable  Last Vitals:  Vitals Value Taken Time  BP 138/106 08/28/2018 11:20 AM  Temp    Pulse 96 08/28/2018 11:20 AM  Resp 13 08/28/2018 11:20 AM  SpO2 100 % 08/28/2018 11:20 AM  Vitals shown include unvalidated device data.  Last Pain:  Vitals:   08/28/18 0824  TempSrc:   PainSc: 0-No pain         Complications: No apparent anesthesia complications

## 2018-08-28 NOTE — H&P (Signed)
Kyle [name] is an 42 y.o. male.   Chief Complaint: nasal obstruction HPI: hx of OSA and nasal obstruction. Here for septoplasty  Past Medical History:  Diagnosis Date  . Diabetes type 2, controlled (HCC)    Dr Tedd Sias  . Dyslipidemia 05/25/2014  . Family history of adverse reaction to anesthesia    Mother has postoperatve N&V  . GERD 05/11/2007  . Gout, unspecified 09/05/2009  . History of kidney stones    h/o kidney stones "regularly over the past 5 years"  . HTN (hypertension) 09/08/2008  . IBS 01/08/2008  . SLEEP APNEA 01/08/2008   cannot tolerate CPAP  . THYROID STIMULATING HORMONE, ABNORMAL 10/10/2008    Past Surgical History:  Procedure Laterality Date  . COLONOSCOPY    . HERNIA REPAIR    . UPPER GASTROINTESTINAL ENDOSCOPY      Family History  Problem Relation Age of Onset  . Stroke Other        bilateral grandfathers  . Hypertension Other        strong on maternal side  . Cancer Sister 26       cervical  . Cancer Father 56       prostate s/p radiation seed implant  . CAD Father 14  . Hypothyroidism Father 66  . CAD Mother 60  . CAD Other        bilateral grandfathers  . Diabetes Neg Hx    Social History:  reports that he quit smoking about 14 years ago. He quit smokeless tobacco use about 14 years ago. He reports that he drinks alcohol. He reports that he does not use drugs.  Allergies:  Allergies  Allergen Reactions  . Penicillins Swelling and Other (See Comments)    SWELLING REACTION UNSPECIFIED  Has patient had a PCN reaction causing immediate rash, facial/tongue/throat swelling, SOB or lightheadedness with hypotension: No Has patient had a PCN reaction causing severe rash involving mucus membranes or skin necrosis: No Has patient had a PCN reaction that required hospitalization: No Has patient had a PCN reaction occurring within the last 10 years: No If all of the above answers are "NO", then may proceed with Cephalosporin use.     Medications Prior  to Admission  Medication Sig Dispense Refill  . allopurinol (ZYLOPRIM) 300 MG tablet Take 1 tablet (300 mg total) by mouth daily. 90 tablet 3  . amLODipine (NORVASC) 10 MG tablet Take 1 tablet (10 mg total) by mouth daily. 90 tablet 3  . Ascorbic Acid (VITAMIN C) 500 MG CAPS Take 1 capsule by mouth daily. (Patient taking differently: Take 500 mg by mouth daily. ) 60 capsule   . atorvastatin (LIPITOR) 20 MG tablet Take 1 tablet (20 mg total) by mouth daily. 90 tablet 3  . Cholecalciferol (VITAMIN D3) 50000 units TABS Take 1 tablet by mouth once a week. (Patient taking differently: Take 50,000 Units by mouth every Monday. ) 12 tablet 1  . Hypromellose (ARTIFICIAL TEARS OP) Place 2 drops into both eyes 2 (two) times daily as needed (for dry eyes).     Marland Kitchen losartan (COZAAR) 100 MG tablet TAKE 1 TABLET BY MOUTH DAILY (Patient taking differently: Take 100 mg by mouth daily. ) 90 tablet 3  . metFORMIN (GLUCOPHAGE) 500 MG tablet Take 1,000 mg by mouth daily with breakfast.     . Cholecalciferol (VITAMIN D) 2000 units CAPS Take 1 capsule (2,000 Units total) by mouth daily. (Patient not taking: Reported on 08/18/2018) 30 capsule   . vitamin  B-12 (V-R VITAMIN B-12) 500 MCG tablet Take 1 tablet (500 mcg total) by mouth daily. (Patient not taking: Reported on 08/18/2018)      Results for orders placed or performed during the hospital encounter of 08/28/18 (from the past 48 hour(s))  Glucose, capillary     Status: Abnormal   Collection Time: 08/28/18  8:17 AM  Result Value Ref Range   Glucose-Capillary 201 (H) 70 - 99 mg/dL   No results found.  Review of Systems  Constitutional: Negative.   HENT: Negative.   Eyes: Negative.   Respiratory: Negative.   Cardiovascular: Negative.   Skin: Negative.     Blood pressure (!) 133/96, pulse 91, temperature 97.6 F (36.4 C), temperature source Oral, resp. rate 18, height 6\' 2"  (1.88 m), weight 124.7 kg, SpO2 98 %. Physical Exam  Constitutional: He appears  well-developed and well-nourished.  HENT:  Head: Normocephalic and atraumatic.  Mouth/Throat: Oropharynx is clear and moist.  Eyes: Pupils are equal, round, and reactive to light. Conjunctivae are normal.  Neck: Normal range of motion. Neck supple.  Cardiovascular: Normal rate.  Respiratory: Effort normal.  GI: Soft.  Musculoskeletal: Normal range of motion.     Assessment/Plan Deviated septum and turbinate hypertrophy- discussed procedure and ready to proceed  [name], MD 08/28/2018, 9:52 AM

## 2018-08-29 ENCOUNTER — Encounter (HOSPITAL_COMMUNITY): Payer: Self-pay | Admitting: Otolaryngology

## 2018-08-29 DIAGNOSIS — J342 Deviated nasal septum: Secondary | ICD-10-CM | POA: Diagnosis not present

## 2018-08-29 DIAGNOSIS — Z880 Allergy status to penicillin: Secondary | ICD-10-CM | POA: Diagnosis not present

## 2018-08-29 DIAGNOSIS — Z7984 Long term (current) use of oral hypoglycemic drugs: Secondary | ICD-10-CM | POA: Diagnosis not present

## 2018-08-29 DIAGNOSIS — J343 Hypertrophy of nasal turbinates: Secondary | ICD-10-CM | POA: Diagnosis not present

## 2018-08-29 DIAGNOSIS — Z87442 Personal history of urinary calculi: Secondary | ICD-10-CM | POA: Diagnosis not present

## 2018-08-29 DIAGNOSIS — Z79899 Other long term (current) drug therapy: Secondary | ICD-10-CM | POA: Diagnosis not present

## 2018-08-29 DIAGNOSIS — I10 Essential (primary) hypertension: Secondary | ICD-10-CM | POA: Diagnosis not present

## 2018-08-29 DIAGNOSIS — G4733 Obstructive sleep apnea (adult) (pediatric): Secondary | ICD-10-CM | POA: Diagnosis not present

## 2018-08-29 DIAGNOSIS — M109 Gout, unspecified: Secondary | ICD-10-CM | POA: Diagnosis not present

## 2018-08-29 DIAGNOSIS — Z87891 Personal history of nicotine dependence: Secondary | ICD-10-CM | POA: Diagnosis not present

## 2018-08-29 DIAGNOSIS — E119 Type 2 diabetes mellitus without complications: Secondary | ICD-10-CM | POA: Diagnosis not present

## 2018-08-29 LAB — GLUCOSE, CAPILLARY: GLUCOSE-CAPILLARY: 265 mg/dL — AB (ref 70–99)

## 2018-08-29 LAB — HIV ANTIBODY (ROUTINE TESTING W REFLEX): HIV Screen 4th Generation wRfx: NONREACTIVE

## 2018-08-29 NOTE — Progress Notes (Signed)
Patient discharged to home with instructions. 

## 2018-08-29 NOTE — Progress Notes (Signed)
   ENT Progress Note: POD #1 s/p Procedure(s): NASAL SEPTOPLASTY WITH TURBINATE REDUCTION   Subjective: No pain  Objective: Vital signs in last 24 hours: Temp:  [97.9 F (36.6 C)-98.7 F (37.1 C)] 98.7 F (37.1 C) (11/02 0526) Pulse Rate:  [88-114] 102 (11/02 0526) Resp:  [14-20] 18 (11/02 0526) BP: (129-152)/(55-106) 140/78 (11/02 0526) SpO2:  [95 %-100 %] 99 % (11/02 0526) Weight change:  Last BM Date: 08/28/18  Intake/Output from previous day: 11/01 0701 - 11/02 0700 In: 2370.3 [P.O.:1062; I.V.:1308.3] Out: -  Intake/Output this shift: No intake/output data recorded.  Labs: No results for input(s): WBC, HGB, HCT, PLT in the last 72 hours. No results for input(s): NA, K, CL, CO2, GLUCOSE, BUN, CALCIUM in the last 72 hours.  Invalid input(s): CREATININR  Studies/Results: No results found.   PHYSICAL EXAM: No bleeding Airway intact   Assessment/Plan: D/C to home    Osborn Coho 08/29/2018, 9:42 AM

## 2018-08-29 NOTE — Discharge Summary (Signed)
Physician Discharge Summary  Patient ID: Kyle [name] MRN: [id number] DOB/AGE: [birth year] 42 y.o.  Admit date: 08/28/2018 Discharge date: 08/29/2018  Admission Diagnoses:  Active Problems:   Sleep apnea   Discharge Diagnoses:  Same  Surgeries: Procedure(s): NASAL SEPTOPLASTY WITH TURBINATE REDUCTION on 08/28/2018   Consultants: None  Discharged Condition: Improved  Hospital Course: Kyle [name] is an 42 y.o. male who was admitted 08/28/2018 with a diagnosis of Active Problems:   Sleep apnea  and went to the operating room on 08/28/2018 and underwent the above named procedures.   Stable POD #1, d/c to home.  Recent vital signs:  Vitals:   08/29/18 0244 08/29/18 0526  BP: (!) 148/82 140/78  Pulse: (!) 105 (!) 102  Resp: 18 18  Temp: 98.5 F (36.9 C) 98.7 F (37.1 C)  SpO2: 97% 99%    Recent laboratory studies:  Results for orders placed or performed during the hospital encounter of 08/28/18  Glucose, capillary  Result Value Ref Range   Glucose-Capillary 201 (H) 70 - 99 mg/dL  Glucose, capillary  Result Value Ref Range   Glucose-Capillary 181 (H) 70 - 99 mg/dL  Glucose, capillary  Result Value Ref Range   Glucose-Capillary 178 (H) 70 - 99 mg/dL  HIV antibody (Routine Testing)  Result Value Ref Range   HIV Screen 4th Generation wRfx Non Reactive Non Reactive  Glucose, capillary  Result Value Ref Range   Glucose-Capillary 521 (HH) 70 - 99 mg/dL   Comment 1 Notify RN   Glucose, capillary  Result Value Ref Range   Glucose-Capillary 442 (H) 70 - 99 mg/dL  Glucose, capillary  Result Value Ref Range   Glucose-Capillary 371 (H) 70 - 99 mg/dL  Glucose, capillary  Result Value Ref Range   Glucose-Capillary 265 (H) 70 - 99 mg/dL    Discharge Medications:   Allergies as of 08/29/2018      Reactions   Penicillins Swelling, Other (See Comments)   SWELLING REACTION UNSPECIFIED  Has patient had a PCN reaction causing immediate rash, facial/tongue/throat swelling,  SOB or lightheadedness with hypotension: No Has patient had a PCN reaction causing severe rash involving mucus membranes or skin necrosis: No Has patient had a PCN reaction that required hospitalization: No Has patient had a PCN reaction occurring within the last 10 years: No If all of the above answers are "NO", then may proceed with Cephalosporin use.      Medication List    TAKE these medications   allopurinol 300 MG tablet Commonly known as:  ZYLOPRIM Take 1 tablet (300 mg total) by mouth daily.   amLODipine 10 MG tablet Commonly known as:  NORVASC Take 1 tablet (10 mg total) by mouth daily.   ARTIFICIAL TEARS OP Place 2 drops into both eyes 2 (two) times daily as needed (for dry eyes).   atorvastatin 20 MG tablet Commonly known as:  LIPITOR Take 1 tablet (20 mg total) by mouth daily.   clindamycin 300 MG capsule Commonly known as:  CLEOCIN Take 1 capsule (300 mg total) by mouth 3 (three) times daily for 10 days.   losartan 100 MG tablet Commonly known as:  COZAAR TAKE 1 TABLET BY MOUTH DAILY   metFORMIN 500 MG tablet Commonly known as:  GLUCOPHAGE Take 1,000 mg by mouth daily with breakfast.   vitamin B-12 500 MCG tablet Commonly known as:  CYANOCOBALAMIN Take 1 tablet (500 mcg total) by mouth daily.   Vitamin C 500 MG Caps Take 1 capsule by mouth  daily. What changed:  how much to take   Vitamin D 2000 units Caps Take 1 capsule (2,000 Units total) by mouth daily. What changed:  Another medication with the same name was changed. Make sure you understand how and when to take each.   Vitamin D3 50000 units Tabs Take 1 tablet by mouth once a week. What changed:    how much to take  when to take this       Diagnostic Studies: No results found.  Disposition: Discharge disposition: 01-Home or Self Care       Discharge Instructions    Call MD for:  difficulty breathing, headache or visual disturbances   Complete by:  As directed    Call MD for:   extreme fatigue   Complete by:  As directed    Call MD for:  hives   Complete by:  As directed    Call MD for:  persistant dizziness or light-headedness   Complete by:  As directed    Call MD for:  persistant nausea and vomiting   Complete by:  As directed    Call MD for:  redness, tenderness, or signs of infection (pain, swelling, redness, odor or green/yellow discharge around incision site)   Complete by:  As directed    Call MD for:  severe uncontrolled pain   Complete by:  As directed    Call MD for:  temperature >100.4   Complete by:  As directed    Diet - low sodium heart healthy   Complete by:  As directed    Diet - low sodium heart healthy   Complete by:  As directed    Discharge instructions   Complete by:  As directed    Call for appointment to follow-up in 2 weeks. Use saline irrigation to the nose twice a day with the irrigation bottle starting Saturday or Sunday. Use Motrin and Tylenol if all possible as narcotics or not good for sleep apnea. Use clindamycin 300 mg 3 times a day. Call if any questions or problems. Excessive bleeding would be more than 4 drip pads across the nose saturated per hour.   Increase activity slowly   Complete by:  As directed    Increase activity slowly   Complete by:  As directed          Signed: Osborn Coho 08/29/2018, 9:46 AM

## 2018-11-24 DIAGNOSIS — E1165 Type 2 diabetes mellitus with hyperglycemia: Secondary | ICD-10-CM | POA: Diagnosis not present

## 2018-12-28 ENCOUNTER — Other Ambulatory Visit: Payer: Self-pay | Admitting: Family Medicine

## 2018-12-28 DIAGNOSIS — M1A9XX0 Chronic gout, unspecified, without tophus (tophi): Secondary | ICD-10-CM

## 2018-12-28 DIAGNOSIS — I10 Essential (primary) hypertension: Secondary | ICD-10-CM

## 2018-12-28 DIAGNOSIS — E559 Vitamin D deficiency, unspecified: Secondary | ICD-10-CM

## 2018-12-28 DIAGNOSIS — E785 Hyperlipidemia, unspecified: Secondary | ICD-10-CM

## 2018-12-28 DIAGNOSIS — E1169 Type 2 diabetes mellitus with other specified complication: Secondary | ICD-10-CM

## 2018-12-29 ENCOUNTER — Other Ambulatory Visit (INDEPENDENT_AMBULATORY_CARE_PROVIDER_SITE_OTHER): Payer: BLUE CROSS/BLUE SHIELD

## 2018-12-29 DIAGNOSIS — E1169 Type 2 diabetes mellitus with other specified complication: Secondary | ICD-10-CM

## 2018-12-29 DIAGNOSIS — E559 Vitamin D deficiency, unspecified: Secondary | ICD-10-CM | POA: Diagnosis not present

## 2018-12-29 DIAGNOSIS — M1A9XX0 Chronic gout, unspecified, without tophus (tophi): Secondary | ICD-10-CM | POA: Diagnosis not present

## 2018-12-29 DIAGNOSIS — E785 Hyperlipidemia, unspecified: Secondary | ICD-10-CM

## 2018-12-29 LAB — COMPREHENSIVE METABOLIC PANEL
ALT: 40 U/L (ref 0–53)
AST: 21 U/L (ref 0–37)
Albumin: 4.5 g/dL (ref 3.5–5.2)
Alkaline Phosphatase: 48 U/L (ref 39–117)
BILIRUBIN TOTAL: 0.5 mg/dL (ref 0.2–1.2)
BUN: 18 mg/dL (ref 6–23)
CHLORIDE: 103 meq/L (ref 96–112)
CO2: 28 meq/L (ref 19–32)
Calcium: 9.4 mg/dL (ref 8.4–10.5)
Creatinine, Ser: 0.76 mg/dL (ref 0.40–1.50)
GFR: 112 mL/min (ref >60.00)
GLUCOSE: 153 mg/dL — AB (ref 70–99)
Potassium: 4 mEq/L (ref 3.5–5.1)
Sodium: 140 mEq/L (ref 135–145)
Total Protein: 7.2 g/dL (ref 6.0–8.3)

## 2018-12-29 LAB — LIPID PANEL
CHOL/HDL RATIO: 4
Cholesterol: 113 mg/dL (ref 0–200)
HDL: 25.5 mg/dL — AB (ref >39.00)
NONHDL: 87.71
Triglycerides: 296 mg/dL — ABNORMAL HIGH (ref 0.0–149.0)
VLDL: 59.2 mg/dL — AB (ref 0.0–40.0)

## 2018-12-29 LAB — URIC ACID: URIC ACID, SERUM: 4.4 mg/dL (ref 4.0–7.8)

## 2018-12-29 LAB — HEMOGLOBIN A1C: HEMOGLOBIN A1C: 9.1 % — AB (ref 4.6–6.5)

## 2018-12-29 LAB — VITAMIN D 25 HYDROXY (VIT D DEFICIENCY, FRACTURES): VITD: 22.6 ng/mL — AB (ref 30.00–100.00)

## 2018-12-29 LAB — LDL CHOLESTEROL, DIRECT: Direct LDL: 52 mg/dL

## 2018-12-31 ENCOUNTER — Encounter: Payer: Self-pay | Admitting: Family Medicine

## 2018-12-31 NOTE — Progress Notes (Signed)
BP 122/76 (BP Location: Left Arm, Patient Position: Sitting, Cuff Size: Large)   Pulse 86   Temp 98.2 F (36.8 C) (Oral)   Ht 6' 1.75" (1.873 m)   Wt 259 lb 8 oz (117.7 kg)   SpO2 96%   BMI 33.54 kg/m    CC: 6 mo f/u visit Subjective:    Patient ID: Kyle [name], male    DOB: [birth year], 43 y.o.   MRN: [id number]  HPI: Kyle [name] is a 43 y.o. male presenting on 01/01/2019 for Follow-up (Here for 6 mo f/u.)    OSA - intolerant of CPAP. Referred to ENT Jearld Fenton([facility]) - s/p nasal septoplasty and turbinate reduction 08/2018. Started using dental appliance.   DM - followed by endo Dr Tedd SiasSolum last seen 10/2018 was on metformin 1000mg  and jardiance but ran out late last year. Uses freestyle glucometer. 130-140 in am, checks a few times a week. Planned 3 mo f/u visit. Denies paresthesias. No low sugars or hypoglycemic  symptoms.   HLD - on lipitor.   Ongoing L flank pain for the past year with radiation to L anterior abdomen. H/o kidney stones but hasn't noticed any stones with straining urine. Pain can be associated with constipation.   Notes knot on posterior R heel. Not painful but uncomfortable.      Relevant past medical, surgical, family and social history reviewed and updated as indicated. Interim medical history since our last visit reviewed. Allergies and medications reviewed and updated. Outpatient Medications Prior to Visit  Medication Sig Dispense Refill  . allopurinol (ZYLOPRIM) 300 MG tablet Take 1 tablet (300 mg total) by mouth daily. 90 tablet 3  . amLODipine (NORVASC) 10 MG tablet Take 1 tablet (10 mg total) by mouth daily. 90 tablet 3  . Ascorbic Acid (VITAMIN C) 500 MG CAPS Take 1 capsule by mouth daily. (Patient taking differently: Take 500 mg by mouth daily. ) 60 capsule   . atorvastatin (LIPITOR) 20 MG tablet Take 1 tablet (20 mg total) by mouth daily. 90 tablet 3  . Hypromellose (ARTIFICIAL TEARS OP) Place 2 drops into both eyes 2 (two) times daily as needed (for dry  eyes).     Marland Kitchen. losartan (COZAAR) 100 MG tablet TAKE 1 TABLET BY MOUTH DAILY (Patient taking differently: Take 100 mg by mouth daily. ) 90 tablet 3  . metFORMIN (GLUCOPHAGE) 500 MG tablet Take 1,000 mg by mouth daily with breakfast.     . vitamin B-12 (V-R VITAMIN B-12) 500 MCG tablet Take 1 tablet (500 mcg total) by mouth daily.    . empagliflozin (JARDIANCE) 25 MG TABS tablet Take 25 mg by mouth daily.    . Cholecalciferol (VITAMIN D) 2000 units CAPS Take 1 capsule (2,000 Units total) by mouth daily. (Patient not taking: Reported on 01/01/2019) 30 capsule   . Cholecalciferol (VITAMIN D3) 50000 units TABS Take 1 tablet by mouth once a week. (Patient not taking: Reported on 01/01/2019) 12 tablet 1   No facility-administered medications prior to visit.      Per HPI unless specifically indicated in ROS section below Review of Systems Objective:    BP 122/76 (BP Location: Left Arm, Patient Position: Sitting, Cuff Size: Large)   Pulse 86   Temp 98.2 F (36.8 C) (Oral)   Ht 6' 1.75" (1.873 m)   Wt 259 lb 8 oz (117.7 kg)   SpO2 96%   BMI 33.54 kg/m   Wt Readings from Last 3 Encounters:  01/01/19 259 lb 8 oz (  117.7 kg)  08/28/18 275 lb (124.7 kg)  08/21/18 275 lb 12.8 oz (125.1 kg)    Physical Exam Vitals signs and nursing note reviewed.  Constitutional:      General: He is not in acute distress.    Appearance: Normal appearance. He is well-developed.  HENT:     Head: Normocephalic and atraumatic.     Right Ear: External ear normal.     Left Ear: External ear normal.     Nose: Nose normal.     Mouth/Throat:     Mouth: Mucous membranes are moist.     Pharynx: No oropharyngeal exudate.  Eyes:     General: No scleral icterus.    Conjunctiva/sclera: Conjunctivae normal.     Pupils: Pupils are equal, round, and reactive to light.  Neck:     Musculoskeletal: Normal range of motion and neck supple.  Cardiovascular:     Rate and Rhythm: Normal rate and regular rhythm.     Heart sounds:  Normal heart sounds. No murmur.  Pulmonary:     Effort: Pulmonary effort is normal. No respiratory distress.     Breath sounds: Normal breath sounds. No wheezing or rales.  Abdominal:     General: Abdomen is flat. Bowel sounds are normal. There is no distension.     Palpations: Abdomen is soft. There is no mass.     Tenderness: There is no abdominal tenderness. There is no right CVA tenderness, left CVA tenderness, guarding or rebound.     Hernia: No hernia is present.  Musculoskeletal:     Comments: See HPI for foot exam if done  Lymphadenopathy:     Cervical: No cervical adenopathy.  Skin:    General: Skin is warm and dry.     Findings: No rash.  Neurological:     Mental Status: He is alert.  Psychiatric:        Mood and Affect: Mood normal.       Results for orders placed or performed in visit on 01/01/19  POCT Urinalysis Dipstick (Automated)  Result Value Ref Range   Color, UA yellow    Clarity, UA clear    Glucose, UA Positive (A) Negative   Bilirubin, UA negative    Ketones, UA negative    Spec Grav, UA 1.020 1.010 - 1.025   Blood, UA negative    pH, UA 5.0 5.0 - 8.0   Protein, UA Negative Negative   Urobilinogen, UA 0.2 0.2 or 1.0 E.U./dL   Nitrite, UA negative    Leukocytes, UA Negative Negative   Assessment & Plan:   Problem List Items Addressed This Visit    Vitamin D deficiency    Restart weekly supplementation for 6 months then recheck levels.       OSA (obstructive sleep apnea)    S/p septoplasty and turbinate reduction, now using dental appliance. Satisfied with results.       Left flank pain - Primary    Ongoing for 1 year. H/o kidney stones. Update UA and check renal ultrasound       Relevant Orders   POCT Urinalysis Dipstick (Automated) (Completed)   US Renal   History of kidney stones   Haglund's deformity of both heels    Reviewed with patient. Encouraged supportive shoewear.       Gout    Stable period on current regimen - continue        Essential hypertension    Chronic, stable. Continue current regimen.  Dyslipidemia    Continue statin. Trig elevated, HDL low, encouraged better glycemic control for better lipid control.       Diabetes mellitus type 2, uncontrolled, with complications (HCC)    A1c elevated - but he had run out of meds until 2 months ago. Encouraged endo f/u. Foot exam today.       Relevant Medications   empagliflozin (JARDIANCE) 25 MG TABS tablet       Meds ordered this encounter  Medications  . Cholecalciferol (VITAMIN D3) 1.25 MG (50000 UT) TABS    Sig: Take 1 tablet by mouth once a week.    Dispense:  12 tablet    Refill:  1   Orders Placed This Encounter  Procedures  . US Renal    Standing Status:   Future    Standing Expiration Date:   03/02/2020    Order Specific Question:   Reason for Exam (SYMPTOM  OR DIAGNOSIS REQUIRED)    Answer:   L flank pain for 1 yr    Order Specific Question:   Preferred imaging location?    Answer:   [id number]  . POCT Urinalysis Dipstick (Automated)    Follow up plan: Return in about 6 months (around 07/04/2019) for annual exam, prior fasting for blood work.  Eustaquio Boyden, MD

## 2019-01-01 ENCOUNTER — Encounter: Payer: Self-pay | Admitting: Family Medicine

## 2019-01-01 ENCOUNTER — Ambulatory Visit (INDEPENDENT_AMBULATORY_CARE_PROVIDER_SITE_OTHER): Payer: BLUE CROSS/BLUE SHIELD | Admitting: Family Medicine

## 2019-01-01 VITALS — BP 122/76 | HR 86 | Temp 98.2°F | Ht 73.75 in | Wt 259.5 lb

## 2019-01-01 DIAGNOSIS — M1A9XX0 Chronic gout, unspecified, without tophus (tophi): Secondary | ICD-10-CM

## 2019-01-01 DIAGNOSIS — Z87442 Personal history of urinary calculi: Secondary | ICD-10-CM

## 2019-01-01 DIAGNOSIS — IMO0002 Reserved for concepts with insufficient information to code with codable children: Secondary | ICD-10-CM

## 2019-01-01 DIAGNOSIS — R10A2 Flank pain, left side: Secondary | ICD-10-CM | POA: Insufficient documentation

## 2019-01-01 DIAGNOSIS — I10 Essential (primary) hypertension: Secondary | ICD-10-CM

## 2019-01-01 DIAGNOSIS — R109 Unspecified abdominal pain: Secondary | ICD-10-CM | POA: Diagnosis not present

## 2019-01-01 DIAGNOSIS — G4733 Obstructive sleep apnea (adult) (pediatric): Secondary | ICD-10-CM | POA: Diagnosis not present

## 2019-01-01 DIAGNOSIS — E785 Hyperlipidemia, unspecified: Secondary | ICD-10-CM

## 2019-01-01 DIAGNOSIS — E118 Type 2 diabetes mellitus with unspecified complications: Secondary | ICD-10-CM

## 2019-01-01 DIAGNOSIS — E1165 Type 2 diabetes mellitus with hyperglycemia: Secondary | ICD-10-CM

## 2019-01-01 DIAGNOSIS — E559 Vitamin D deficiency, unspecified: Secondary | ICD-10-CM

## 2019-01-01 DIAGNOSIS — M9261 Juvenile osteochondrosis of tarsus, right ankle: Secondary | ICD-10-CM | POA: Insufficient documentation

## 2019-01-01 DIAGNOSIS — M9262 Juvenile osteochondrosis of tarsus, left ankle: Secondary | ICD-10-CM

## 2019-01-01 LAB — POC URINALSYSI DIPSTICK (AUTOMATED)
Bilirubin, UA: NEGATIVE
GLUCOSE UA: POSITIVE — AB
Ketones, UA: NEGATIVE
Leukocytes, UA: NEGATIVE
NITRITE UA: NEGATIVE
PH UA: 5 (ref 5.0–8.0)
Protein, UA: NEGATIVE
RBC UA: NEGATIVE
Spec Grav, UA: 1.02 (ref 1.010–1.025)
UROBILINOGEN UA: 0.2 U/dL

## 2019-01-01 MED ORDER — VITAMIN D3 1.25 MG (50000 UT) PO TABS: 1.0000 | ORAL_TABLET | ORAL | 1 refills | Status: DC

## 2019-01-01 NOTE — Assessment & Plan Note (Signed)
Ongoing for 1 year. H/o kidney stones. Update UA and check renal ultrasound

## 2019-01-01 NOTE — Assessment & Plan Note (Signed)
Chronic, stable. Continue current regimen. 

## 2019-01-01 NOTE — Assessment & Plan Note (Signed)
Continue statin. Trig elevated, HDL low, encouraged better glycemic control for better lipid control.

## 2019-01-01 NOTE — Assessment & Plan Note (Signed)
S/p septoplasty and turbinate reduction, now using dental appliance. Satisfied with results.

## 2019-01-01 NOTE — Assessment & Plan Note (Signed)
Stable period on current regimen - continue.  

## 2019-01-01 NOTE — Patient Instructions (Addendum)
Restart vitamin D prescription weekly Keep follow up with Dr Tedd Sias I think you are developing haglund disease - consider gel cushion inserts for shoes.  Urinalysis today. We will check kidney ultrasound.  Return in 6 months for physical.

## 2019-01-01 NOTE — Assessment & Plan Note (Signed)
Reviewed with patient. Encouraged supportive shoewear.

## 2019-01-01 NOTE — Assessment & Plan Note (Signed)
A1c elevated - but he had run out of meds until 2 months ago. Encouraged endo f/u. Foot exam today.

## 2019-01-01 NOTE — Assessment & Plan Note (Signed)
Restart weekly supplementation for 6 months then recheck levels.

## 2019-01-08 ENCOUNTER — Ambulatory Visit (INDEPENDENT_AMBULATORY_CARE_PROVIDER_SITE_OTHER): Payer: BLUE CROSS/BLUE SHIELD | Admitting: Family Medicine

## 2019-01-08 ENCOUNTER — Other Ambulatory Visit: Payer: Self-pay

## 2019-01-08 ENCOUNTER — Encounter: Payer: Self-pay | Admitting: Family Medicine

## 2019-01-08 VITALS — BP 132/82 | HR 91 | Temp 98.8°F | Ht 73.75 in | Wt 258.2 lb

## 2019-01-08 DIAGNOSIS — W57XXXA Bitten or stung by nonvenomous insect and other nonvenomous arthropods, initial encounter: Secondary | ICD-10-CM

## 2019-01-08 DIAGNOSIS — L089 Local infection of the skin and subcutaneous tissue, unspecified: Secondary | ICD-10-CM

## 2019-01-08 DIAGNOSIS — S80861A Insect bite (nonvenomous), right lower leg, initial encounter: Secondary | ICD-10-CM | POA: Diagnosis not present

## 2019-01-08 MED ORDER — DOXYCYCLINE HYCLATE 100 MG PO TABS: 100.0000 mg | ORAL_TABLET | Freq: Two times a day (BID) | ORAL | 0 refills | Status: DC

## 2019-01-08 NOTE — Assessment & Plan Note (Signed)
Not consistent with lyme disease but concern for cellulitis after tick bite. Treat with 7d doxy course. Discussed warm compresses, NSAID. Update if not improving with treatment.

## 2019-01-08 NOTE — Patient Instructions (Addendum)
I think you had skin infection after tick bite, not necessarily lyme disease but we will use same antibiotic.  Take doxycycline 100mg  twice daily for 1 week.  May continue advil or aleve.  Use warm compresses to area to speed recovery.   Insect Bite, Adult An insect bite can make your skin red, itchy, and swollen. An insect bite is different from an insect sting, which happens when an insect injects poison (venom) into the skin. Some insects can spread disease to people through a bite. However, most insect bites do not lead to disease and are not serious. What are the causes? Insects may bite for a variety of reasons, including:  Hunger.  To defend themselves. Insects that bite include:  Spiders.  Mosquitoes.  Ticks.  Fleas.  Ants.  Flies.  Kissing bugs.  Chiggers. What are the signs or symptoms? Symptoms of this condition include:  Itching or pain in the bite area.  Redness and swelling in the bite area.  An open wound (skin ulcer). In many cases, symptoms last for 2-4 days. In rare cases, a person may have a severe allergic reaction (anaphylactic reaction) to a bite. Symptoms of an anaphylactic reaction may include:  Feeling warm in the face (flushed). This may include redness.  Itchy, red, swollen areas of skin (hives).  Swelling of the eyes, lips, face, mouth, tongue, or throat.  Difficulty breathing, speaking, or swallowing.  Noisy breathing (wheezing).  Dizziness or light-headedness.  Fainting.  Pain or cramping in the abdomen.  Vomiting.  Diarrhea. How is this diagnosed? This condition is usually diagnosed based on symptoms and a physical exam. How is this treated? Treatment is usually not needed. Symptoms often go away on their own. When treatment is recommended, it may involve:  Applying a cream or lotion to the bite area. This treatment helps with itching.  Taking an antibiotic medicine. This treatment is needed if the bite area gets  infected.  Getting a tetanus shot, if you are not up to date on this vaccine.  Applying ice to the affected area.  Allergy medicines called antihistamines. This treatment may be needed if you develop itching or an allergic reaction to the insect bite.  Giving yourself an epinephrine injection if you have an anaphylactic reaction to a bite. To give the injection, you will use what is commonly called an auto-injector "pen" (pre-filled automatic epinephrine injection device). Your health care provider will teach you how to use an auto-injector pen. Follow these instructions at home: Bite area care   Do not scratch the bite area.  Keep the bite area clean and dry. Wash it every day with soap and water as told by your health care provider.  Check the bite area every day for signs of infection. Check for: ? Redness, swelling, or pain. ? Fluid or blood. ? Warmth. ? Pus or a bad smell. Managing pain, itching, and swelling   You may apply cortisone cream, calamine lotion, or a paste made of baking soda and water to the bite area as told by your health care provider.  If directed, put ice on the bite area. ? Put ice in a plastic bag. ? Place a towel between your skin and the bag. ? Leave the ice on for 20 minutes, 2-3 times a day. General instructions  Apply or take over-the-counter and prescription medicines only as told by your health care provider.  If you were prescribed an antibiotic medicine, take or apply it as told by your health  care provider. Do not stop using the antibiotic even if your condition improves.  Keep all follow-up visits as told by your health care provider. This is important. How is this prevented? To help reduce your risk of insect bites:  When you are outdoors, wear clothing that covers your arms and legs. This is especially important in the early morning and evening.  Use insect repellent. The best insect repellents contain DEET, picaridin, oil of lemon  eucalyptus (OLE), or IR3535.  Consider spraying your clothing with a pesticide called permethrin. Permethrin helps prevent insect bites. It works for several weeks and for up to 5-6 clothing washes. Do not apply permethrin directly to the skin.  If your home windows do not have screens, consider installing them.  If you will be sleeping in an area where there are mosquitoes, consider covering your sleeping area with a mosquito net. Contact a health care provider if:  You have redness, swelling, or pain in the bite area.  You have fluid or blood coming from the bite area.  The bite area feels warm to the touch.  You have pus or a bad smell coming from the bite area.  You have a fever. Get help right away if:  You have joint pain.  You have a rash.  You feel unusually tired or sleepy.  You have neck pain.  You have a headache.  You have unusual weakness.  You develop symptoms of an anaphylactic reaction. These may include: ? Flushed skin. ? Hives. ? Swelling of the eyes, lips, face, mouth, tongue, or throat. ? Difficulty breathing, speaking, or swallowing. ? Wheezing. ? Dizziness or light-headedness. ? Fainting. ? Pain or cramping in the abdomen. ? Vomiting. ? Diarrhea. These symptoms may represent a serious problem that is an emergency. Do not wait to see if the symptoms will go away. Do the following right away:  Use the auto-injector pen as you have been instructed.  Get medical help. Call your local emergency services (911 in the [place] Do not drive yourself to the hospital. Summary  An insect bite can make your skin red, itchy, and swollen.  Treatment is usually not needed. Symptoms often go away on their own. When treatment is recommended, it may involve taking medicine, applying medicine to the area, or applying ice.  Apply or take over-the-counter and prescription medicines only as told by your health care provider.  Use insect repellent to help prevent  insect bites.  Contact a health care provider if you have any signs of infection in the bite area. This information is not intended to replace advice given to you by your health care provider. Make sure you discuss any questions you have with your health care provider. Document Released: 11/21/2004 Document Revised: 04/24/2018 Document Reviewed: 04/24/2018 Elsevier Interactive Patient Education  2019 [employer].

## 2019-01-08 NOTE — Progress Notes (Signed)
BP 132/82 (BP Location: Right Arm, Patient Position: Sitting, Cuff Size: Large)   Pulse 91   Temp 98.8 F (37.1 C) (Oral)   Ht 6' 1.75" (1.873 m)   Wt 258 lb 4 oz (117.1 kg)   SpO2 95%   BMI 33.38 kg/m    CC: tick bite Subjective:    Patient ID: Kyle [name], male    DOB: [birth year], 43 y.o.   MRN: [id number]  HPI: Kyle [name] is a 43 y.o. male presenting on 01/08/2019 for Insect Bite (C/o tick bite on medial lower right leg. Bite occurred on 01/04/19 and was removed. Area is red, ithcy and burns. Tried Advil. Applied alcohol to area, not helpful. )   Tick bite 01/04/2019 - small black tick identified. Felt bite "like a bee sting", removed with tweezers. Redness and swelling around tick bite started yesterday.  Treating with ibuprofen today with benefit.   No fever, headache, joint pains, abd pain.      Relevant past medical, surgical, family and social history reviewed and updated as indicated. Interim medical history since our last visit reviewed. Allergies and medications reviewed and updated. Outpatient Medications Prior to Visit  Medication Sig Dispense Refill  . allopurinol (ZYLOPRIM) 300 MG tablet Take 1 tablet (300 mg total) by mouth daily. 90 tablet 3  . amLODipine (NORVASC) 10 MG tablet Take 1 tablet (10 mg total) by mouth daily. 90 tablet 3  . Ascorbic Acid (VITAMIN C) 500 MG CAPS Take 1 capsule by mouth daily. (Patient taking differently: Take 500 mg by mouth daily. ) 60 capsule   . atorvastatin (LIPITOR) 20 MG tablet Take 1 tablet (20 mg total) by mouth daily. 90 tablet 3  . Cholecalciferol (VITAMIN D3) 1.25 MG (50000 UT) TABS Take 1 tablet by mouth once a week. 12 tablet 1  . empagliflozin (JARDIANCE) 25 MG TABS tablet Take 25 mg by mouth daily.    . Hypromellose (ARTIFICIAL TEARS OP) Place 2 drops into both eyes 2 (two) times daily as needed (for dry eyes).     Marland Kitchen losartan (COZAAR) 100 MG tablet TAKE 1 TABLET BY MOUTH DAILY (Patient taking differently: Take 100 mg  by mouth daily. ) 90 tablet 3  . metFORMIN (GLUCOPHAGE) 500 MG tablet Take 1,000 mg by mouth daily with breakfast.     . vitamin B-12 (V-R VITAMIN B-12) 500 MCG tablet Take 1 tablet (500 mcg total) by mouth daily.     No facility-administered medications prior to visit.      Per HPI unless specifically indicated in ROS section below Review of Systems Objective:    BP 132/82 (BP Location: Right Arm, Patient Position: Sitting, Cuff Size: Large)   Pulse 91   Temp 98.8 F (37.1 C) (Oral)   Ht 6' 1.75" (1.873 m)   Wt 258 lb 4 oz (117.1 kg)   SpO2 95%   BMI 33.38 kg/m   Wt Readings from Last 3 Encounters:  01/08/19 258 lb 4 oz (117.1 kg)  01/01/19 259 lb 8 oz (117.7 kg)  08/28/18 275 lb (124.7 kg)    Physical Exam Vitals signs and nursing note reviewed.  Constitutional:      Appearance: Normal appearance. He is not ill-appearing.  Skin:    General: Skin is warm and dry.     Findings: Erythema present.          Comments: Tick bite R inner leg below knee with surrounding non blancheable erythema. No residual tick parts noted  Neurological:     Mental Status: He is alert.  Psychiatric:        Mood and Affect: Mood normal.       Results for orders placed or performed in visit on 01/01/19  POCT Urinalysis Dipstick (Automated)  Result Value Ref Range   Color, UA yellow    Clarity, UA clear    Glucose, UA Positive (A) Negative   Bilirubin, UA negative    Ketones, UA negative    Spec Grav, UA 1.020 1.010 - 1.025   Blood, UA negative    pH, UA 5.0 5.0 - 8.0   Protein, UA Negative Negative   Urobilinogen, UA 0.2 0.2 or 1.0 E.U./dL   Nitrite, UA negative    Leukocytes, UA Negative Negative   Assessment & Plan:   Problem List Items Addressed This Visit    Tick bite of right lower leg with infection - Primary    Not consistent with lyme disease but concern for cellulitis after tick bite. Treat with 7d doxy course. Discussed warm compresses, NSAID. Update if not improving  with treatment.           Meds ordered this encounter  Medications  . doxycycline (VIBRA-TABS) 100 MG tablet    Sig: Take 1 tablet (100 mg total) by mouth 2 (two) times daily.    Dispense:  14 tablet    Refill:  0   No orders of the defined types were placed in this encounter.   Follow up plan: Return if symptoms worsen or fail to improve.  Eustaquio Boyden, MD

## 2019-01-25 ENCOUNTER — Other Ambulatory Visit: Payer: BLUE CROSS/BLUE SHIELD

## 2019-02-19 DIAGNOSIS — E1165 Type 2 diabetes mellitus with hyperglycemia: Secondary | ICD-10-CM | POA: Diagnosis not present

## 2019-02-26 DIAGNOSIS — I10 Essential (primary) hypertension: Secondary | ICD-10-CM | POA: Diagnosis not present

## 2019-02-26 DIAGNOSIS — E1129 Type 2 diabetes mellitus with other diabetic kidney complication: Secondary | ICD-10-CM | POA: Diagnosis not present

## 2019-02-26 DIAGNOSIS — R809 Proteinuria, unspecified: Secondary | ICD-10-CM | POA: Diagnosis not present

## 2019-03-17 ENCOUNTER — Other Ambulatory Visit: Payer: BLUE CROSS/BLUE SHIELD

## 2019-07-01 ENCOUNTER — Other Ambulatory Visit: Payer: Self-pay | Admitting: Family Medicine

## 2019-07-02 ENCOUNTER — Other Ambulatory Visit: Payer: Self-pay | Admitting: Family Medicine

## 2019-07-09 ENCOUNTER — Other Ambulatory Visit: Payer: Self-pay | Admitting: Family Medicine

## 2019-07-09 ENCOUNTER — Other Ambulatory Visit (INDEPENDENT_AMBULATORY_CARE_PROVIDER_SITE_OTHER): Payer: Managed Care, Other (non HMO)

## 2019-07-09 ENCOUNTER — Other Ambulatory Visit: Payer: Self-pay

## 2019-07-09 DIAGNOSIS — E559 Vitamin D deficiency, unspecified: Secondary | ICD-10-CM

## 2019-07-09 DIAGNOSIS — IMO0002 Reserved for concepts with insufficient information to code with codable children: Secondary | ICD-10-CM

## 2019-07-09 DIAGNOSIS — E1165 Type 2 diabetes mellitus with hyperglycemia: Secondary | ICD-10-CM

## 2019-07-09 DIAGNOSIS — M1A9XX0 Chronic gout, unspecified, without tophus (tophi): Secondary | ICD-10-CM

## 2019-07-09 DIAGNOSIS — E785 Hyperlipidemia, unspecified: Secondary | ICD-10-CM

## 2019-07-09 NOTE — Addendum Note (Signed)
Addended by: Ellamae Sia on: 07/09/2019 02:52 PM   Modules accepted: Orders

## 2019-07-10 LAB — COMPREHENSIVE METABOLIC PANEL
AG Ratio: 2.1 (calc) (ref 1.0–2.5)
ALT: 44 U/L (ref 9–46)
AST: 22 U/L (ref 10–40)
Albumin: 4.5 g/dL (ref 3.6–5.1)
Alkaline phosphatase (APISO): 43 U/L (ref 36–130)
BUN: 15 mg/dL (ref 7–25)
CO2: 29 mmol/L (ref 20–32)
Calcium: 9.3 mg/dL (ref 8.6–10.3)
Chloride: 104 mmol/L (ref 98–110)
Creat: 0.66 mg/dL (ref 0.60–1.35)
Globulin: 2.1 g/dL (calc) (ref 1.9–3.7)
Glucose, Bld: 170 mg/dL — ABNORMAL HIGH (ref 65–99)
Potassium: 4.1 mmol/L (ref 3.5–5.3)
Sodium: 141 mmol/L (ref 135–146)
Total Bilirubin: 0.5 mg/dL (ref 0.2–1.2)
Total Protein: 6.6 g/dL (ref 6.1–8.1)

## 2019-07-10 LAB — HEMOGLOBIN A1C
Hgb A1c MFr Bld: 7.2 % of total Hgb — ABNORMAL HIGH (ref <5.7)
Mean Plasma Glucose: 160 (calc)
eAG (mmol/L): 8.9 (calc)

## 2019-07-10 LAB — LIPID PANEL
Cholesterol: 147 mg/dL (ref <200)
HDL: 32 mg/dL — ABNORMAL LOW (ref >=40)
LDL Cholesterol (Calc): 85 mg/dL (calc)
Non-HDL Cholesterol (Calc): 115 mg/dL (calc) (ref <130)
Total CHOL/HDL Ratio: 4.6 (calc) (ref <5.0)
Triglycerides: 209 mg/dL — ABNORMAL HIGH (ref <150)

## 2019-07-10 LAB — VITAMIN D 25 HYDROXY (VIT D DEFICIENCY, FRACTURES): Vit D, 25-Hydroxy: 16 ng/mL — ABNORMAL LOW (ref 30–100)

## 2019-07-10 LAB — URIC ACID: Uric Acid, Serum: 5.8 mg/dL (ref 4.0–8.0)

## 2019-07-12 ENCOUNTER — Other Ambulatory Visit: Payer: BLUE CROSS/BLUE SHIELD

## 2019-07-13 ENCOUNTER — Encounter: Payer: Self-pay | Admitting: Family Medicine

## 2019-07-13 ENCOUNTER — Other Ambulatory Visit: Payer: Self-pay

## 2019-07-13 ENCOUNTER — Ambulatory Visit (INDEPENDENT_AMBULATORY_CARE_PROVIDER_SITE_OTHER): Payer: Managed Care, Other (non HMO) | Admitting: Family Medicine

## 2019-07-13 VITALS — BP 122/84 | HR 92 | Temp 97.8°F | Ht 73.0 in | Wt 265.3 lb

## 2019-07-13 DIAGNOSIS — E1165 Type 2 diabetes mellitus with hyperglycemia: Secondary | ICD-10-CM

## 2019-07-13 DIAGNOSIS — Z23 Encounter for immunization: Secondary | ICD-10-CM | POA: Diagnosis not present

## 2019-07-13 DIAGNOSIS — E118 Type 2 diabetes mellitus with unspecified complications: Secondary | ICD-10-CM | POA: Diagnosis not present

## 2019-07-13 DIAGNOSIS — Z0000 Encounter for general adult medical examination without abnormal findings: Secondary | ICD-10-CM

## 2019-07-13 DIAGNOSIS — M1A9XX0 Chronic gout, unspecified, without tophus (tophi): Secondary | ICD-10-CM

## 2019-07-13 DIAGNOSIS — IMO0002 Reserved for concepts with insufficient information to code with codable children: Secondary | ICD-10-CM

## 2019-07-13 DIAGNOSIS — R0789 Other chest pain: Secondary | ICD-10-CM

## 2019-07-13 DIAGNOSIS — E785 Hyperlipidemia, unspecified: Secondary | ICD-10-CM

## 2019-07-13 DIAGNOSIS — I10 Essential (primary) hypertension: Secondary | ICD-10-CM

## 2019-07-13 DIAGNOSIS — E559 Vitamin D deficiency, unspecified: Secondary | ICD-10-CM

## 2019-07-13 MED ORDER — LOSARTAN POTASSIUM 100 MG PO TABS: ORAL_TABLET | ORAL | 3 refills | Status: DC

## 2019-07-13 MED ORDER — VITAMIN D3 1.25 MG (50000 UT) PO TABS: 1.0000 | ORAL_TABLET | ORAL | 3 refills | Status: DC

## 2019-07-13 MED ORDER — ALLOPURINOL 300 MG PO TABS: ORAL_TABLET | ORAL | 3 refills | Status: DC

## 2019-07-13 MED ORDER — ATORVASTATIN CALCIUM 20 MG PO TABS: ORAL_TABLET | ORAL | 3 refills | Status: DC

## 2019-07-13 MED ORDER — AMLODIPINE BESYLATE 10 MG PO TABS: ORAL_TABLET | ORAL | 3 refills | Status: DC

## 2019-07-13 NOTE — Assessment & Plan Note (Signed)
Chronic, stable. Continue current regimen. 

## 2019-07-13 NOTE — Assessment & Plan Note (Addendum)
Uric acid levels stable on allopurinol - continue. No recent gout flares.

## 2019-07-13 NOTE — Assessment & Plan Note (Signed)
Preventative protocols reviewed and updated unless pt declined. Discussed healthy diet and lifestyle.  

## 2019-07-13 NOTE — Assessment & Plan Note (Signed)
Levels remain low - restart weekly supplementation (3yr supply sent to pharmacy)

## 2019-07-13 NOTE — Assessment & Plan Note (Signed)
Improved A1c. Continue f/u with endo ([facility]) - appt next month

## 2019-07-13 NOTE — Patient Instructions (Addendum)
Flu shot today You are doing well today.  Return as needed or in 1 year for next physical EKG today   Health Maintenance, Male Adopting a healthy lifestyle and getting preventive care are important in promoting health and wellness. Ask your health care provider about:  The right schedule for you to have regular tests and exams.  Things you can do on your own to prevent diseases and keep yourself healthy. What should I know about diet, weight, and exercise? Eat a healthy diet   Eat a diet that includes plenty of vegetables, fruits, low-fat dairy products, and lean protein.  Do not eat a lot of foods that are high in solid fats, added sugars, or sodium. Maintain a healthy weight Body mass index (BMI) is a measurement that can be used to identify possible weight problems. It estimates body fat based on height and weight. Your health care provider can help determine your BMI and help you achieve or maintain a healthy weight. Get regular exercise Get regular exercise. This is one of the most important things you can do for your health. Most adults should:  Exercise for at least 150 minutes each week. The exercise should increase your heart rate and make you sweat (moderate-intensity exercise).  Do strengthening exercises at least twice a week. This is in addition to the moderate-intensity exercise.  Spend less time sitting. Even light physical activity can be beneficial. Watch cholesterol and blood lipids Have your blood tested for lipids and cholesterol at 43 years of age, then have this test every 5 years. You may need to have your cholesterol levels checked more often if:  Your lipid or cholesterol levels are high.  You are older than 43 years of age.  You are at high risk for heart disease. What should I know about cancer screening? Many types of cancers can be detected early and may often be prevented. Depending on your health history and family history, you may need to have  cancer screening at various ages. This may include screening for:  Colorectal cancer.  Prostate cancer.  Skin cancer.  Lung cancer. What should I know about heart disease, diabetes, and high blood pressure? Blood pressure and heart disease  High blood pressure causes heart disease and increases the risk of stroke. This is more likely to develop in people who have high blood pressure readings, are of African descent, or are overweight.  Talk with your health care provider about your target blood pressure readings.  Have your blood pressure checked: ? Every 3-5 years if you are 60-48 years of age. ? Every year if you are 38 years old or older.  If you are between the ages of 80 and 34 and are a current or former smoker, ask your health care provider if you should have a one-time screening for abdominal aortic aneurysm (AAA). Diabetes Have regular diabetes screenings. This checks your fasting blood sugar level. Have the screening done:  Once every three years after age 42 if you are at a normal weight and have a low risk for diabetes.  More often and at a younger age if you are overweight or have a high risk for diabetes. What should I know about preventing infection? Hepatitis B If you have a higher risk for hepatitis B, you should be screened for this virus. Talk with your health care provider to find out if you are at risk for hepatitis B infection. Hepatitis C Blood testing is recommended for:  Everyone born from  1945 through [id number].  Anyone with known risk factors for hepatitis C. Sexually transmitted infections (STIs)  You should be screened each year for STIs, including gonorrhea and chlamydia, if: ? You are sexually active and are younger than 43 years of age. ? You are older than 43 years of age and your health care provider tells you that you are at risk for this type of infection. ? Your sexual activity has changed since you were last screened, and you are at increased  risk for chlamydia or gonorrhea. Ask your health care provider if you are at risk.  Ask your health care provider about whether you are at high risk for HIV. Your health care provider may recommend a prescription medicine to help prevent HIV infection. If you choose to take medicine to prevent HIV, you should first get tested for HIV. You should then be tested every 3 months for as long as you are taking the medicine. Follow these instructions at home: Lifestyle  Do not use any products that contain nicotine or tobacco, such as cigarettes, e-cigarettes, and chewing tobacco. If you need help quitting, ask your health care provider.  Do not use street drugs.  Do not share needles.  Ask your health care provider for help if you need support or information about quitting drugs. Alcohol use  Do not drink alcohol if your health care provider tells you not to drink.  If you drink alcohol: ? Limit how much you have to 0-2 drinks a day. ? Be aware of how much alcohol is in your drink. In the [place] one drink equals one 12 oz bottle of beer (355 mL), one 5 oz glass of wine (148 mL), or one 1 oz glass of hard liquor (44 mL). General instructions  Schedule regular health, dental, and eye exams.  Stay current with your vaccines.  Tell your health care provider if: ? You often feel depressed. ? You have ever been abused or do not feel safe at home. Summary  Adopting a healthy lifestyle and getting preventive care are important in promoting health and wellness.  Follow your health care provider's instructions about healthy diet, exercising, and getting tested or screened for diseases.  Follow your health care provider's instructions on monitoring your cholesterol and blood pressure. This information is not intended to replace advice given to you by your health care provider. Make sure you discuss any questions you have with your health care provider. Document Released: 04/11/2008 Document  Revised: 10/07/2018 Document Reviewed: 10/07/2018 Elsevier Patient Education  2020 [employer]

## 2019-07-13 NOTE — Assessment & Plan Note (Signed)
Chronic, improving on atorvastatin. Continue. The 10-year ASCVD risk score Mikey Bussing DC Brooke Bonito., et al., 2013) is: 3.4%   Values used to calculate the score:     Age: 43 years     Sex: Male     Is Non-Hispanic African American: No     Diabetic: Yes     Tobacco smoker: No     Systolic Blood Pressure: 197 mmHg     Is BP treated: Yes     HDL Cholesterol: 32 mg/dL     Total Cholesterol: 147 mg/dL

## 2019-07-13 NOTE — Assessment & Plan Note (Signed)
Encouraged healthy diet and lifestyle changes to affect sustainable weight loss.  

## 2019-07-13 NOTE — Assessment & Plan Note (Signed)
Does not sound cardiac. Reviewed cardiac characteristics in detail with patient. In diabetes history, update EKG. Update if progressive symptoms.

## 2019-07-13 NOTE — Progress Notes (Signed)
This visit was conducted in person.  BP 122/84 (BP Location: Left Arm, Patient Position: Sitting, Cuff Size: Large)   Pulse 92   Temp 97.8 F (36.6 C) (Tympanic)   Ht 6\' 1"  (1.854 m)   Wt 265 lb 5 oz (120.3 kg)   SpO2 96%   BMI 35.00 kg/m    CC: CPE Subjective:    Patient ID: Kyle [name], male    DOB: 18-Jun-1976, 43 y.o.   MRN: [id number]  HPI: Kyle [name] is a 43 y.o. male presenting on 07/13/2019 for Annual Exam (Pt has form to be signed.)   DM followed by endo ([facility]) - upcoming appt next month.   OSA intolerant of CPAP - s/p nasal septoplasty/turbinate reduction 08/2018 Janace Hoard), now using dental appliance  Occasional bilateral upper chest tightness when at rest.  No symptoms with exertion - overall sedentary lifestyle. Mild occasional dyspnea. No nausea.   Longstanding L flank pain did improve - never completed renal US.   One of his sons is autistic/ADHD - has has to lock meds, more inconvenient so he occasionally misses doses.   Preventative: Colon cancer screening - colonoscopy 2007. fmhx colon cancer (uncle)  Prostate cancer in father age 49 - father passed away from this (mets)  Flu shotyearly Td 2009, Tdap 2019 Pneumovax 2015.  Seat belt use discussed  Sunscreen use discussed. No changing moles. Non smoker Alcohol - rare Dentist Q6 mo - had root canal this year  Eye exam yearly   Caffeine: 4 cups/day Lives with wife and 2 children (adopted from [place]) Occupation: Freight forwarder at [employer]  Activity:no regular exercise  Diet: good water daily, fruits/vegetables daily     Relevant past medical, surgical, family and social history reviewed and updated as indicated. Interim medical history since our last visit reviewed. Allergies and medications reviewed and updated. Outpatient Medications Prior to Visit  Medication Sig Dispense Refill  . Ascorbic Acid (VITAMIN C) 500 MG CAPS Take 1 capsule by mouth daily. (Patient taking differently: Take  500 mg by mouth daily. ) 60 capsule   . empagliflozin (JARDIANCE) 25 MG TABS tablet Take 25 mg by mouth daily.    . Hypromellose (ARTIFICIAL TEARS OP) Place 2 drops into both eyes 2 (two) times daily as needed (for dry eyes).     . metFORMIN (GLUCOPHAGE) 500 MG tablet Take 1,000 mg by mouth daily with breakfast.     . allopurinol (ZYLOPRIM) 300 MG tablet TAKE 1 TABLET(300 MG) BY MOUTH DAILY 90 tablet 0  . amLODipine (NORVASC) 10 MG tablet TAKE 1 TABLET(10 MG) BY MOUTH DAILY 90 tablet 0  . atorvastatin (LIPITOR) 20 MG tablet TAKE 1 TABLET(20 MG) BY MOUTH DAILY 90 tablet 0  . Cholecalciferol (VITAMIN D3) 1.25 MG (50000 UT) TABS Take 1 tablet by mouth once a week. 12 tablet 1  . losartan (COZAAR) 100 MG tablet TAKE 1 TABLET(100 MG) BY MOUTH DAILY 90 tablet 0  . doxycycline (VIBRA-TABS) 100 MG tablet Take 1 tablet (100 mg total) by mouth 2 (two) times daily. 14 tablet 0  . vitamin B-12 (V-R VITAMIN B-12) 500 MCG tablet Take 1 tablet (500 mcg total) by mouth daily.     No facility-administered medications prior to visit.      Per HPI unless specifically indicated in ROS section below Review of Systems  Constitutional: Negative for activity change, appetite change, chills, fatigue, fever and unexpected weight change.  HENT: Negative for hearing loss.   Eyes: Negative for visual disturbance.  Respiratory: Positive for chest tightness (occasional - see HPI). Negative for cough, shortness of breath and wheezing.   Cardiovascular: Negative for chest pain, palpitations and leg swelling.  Gastrointestinal: Positive for constipation. Negative for abdominal distention, abdominal pain, blood in stool, diarrhea, nausea and vomiting.  Genitourinary: Negative for difficulty urinating and hematuria.  Musculoskeletal: Negative for arthralgias, myalgias and neck pain.  Skin: Negative for rash.  Neurological: Positive for headaches (rare). Negative for dizziness, seizures and syncope.  Hematological: Negative  for adenopathy. Does not bruise/bleed easily.  Psychiatric/Behavioral: Negative for dysphoric mood. The patient is not nervous/anxious.    Objective:    BP 122/84 (BP Location: Left Arm, Patient Position: Sitting, Cuff Size: Large)   Pulse 92   Temp 97.8 F (36.6 C) (Tympanic)   Ht 6\' 1"  (1.854 m)   Wt 265 lb 5 oz (120.3 kg)   SpO2 96%   BMI 35.00 kg/m   Wt Readings from Last 3 Encounters:  07/13/19 265 lb 5 oz (120.3 kg)  01/08/19 258 lb 4 oz (117.1 kg)  01/01/19 259 lb 8 oz (117.7 kg)    Physical Exam Vitals signs and nursing note reviewed.  Constitutional:      General: He is not in acute distress.    Appearance: Normal appearance. He is well-developed. He is not ill-appearing.  HENT:     Head: Normocephalic and atraumatic.     Right Ear: Hearing, tympanic membrane, ear canal and external ear normal.     Left Ear: Hearing, tympanic membrane, ear canal and external ear normal.     Nose: Nose normal.     Mouth/Throat:     Mouth: Mucous membranes are moist.     Pharynx: Uvula midline. No oropharyngeal exudate or posterior oropharyngeal erythema.  Eyes:     General: No scleral icterus.    Conjunctiva/sclera: Conjunctivae normal.     Pupils: Pupils are equal, round, and reactive to light.  Neck:     Musculoskeletal: Normal range of motion and neck supple.  Cardiovascular:     Rate and Rhythm: Normal rate and regular rhythm.     Pulses: Normal pulses.          Radial pulses are 2+ on the right side and 2+ on the left side.     Heart sounds: Normal heart sounds. No murmur.  Pulmonary:     Effort: Pulmonary effort is normal. No respiratory distress.     Breath sounds: Normal breath sounds. No wheezing, rhonchi or rales.  Abdominal:     General: Abdomen is flat. Bowel sounds are normal. There is no distension.     Palpations: Abdomen is soft. There is no mass.     Tenderness: There is no abdominal tenderness. There is no guarding or rebound.  Musculoskeletal: Normal range  of motion.  Lymphadenopathy:     Cervical: No cervical adenopathy.  Skin:    General: Skin is warm and dry.     Findings: No rash.  Neurological:     General: No focal deficit present.     Mental Status: He is alert and oriented to person, place, and time.     Comments: CN grossly intact, station and gait intact  Psychiatric:        Mood and Affect: Mood normal.        Behavior: Behavior normal.        Thought Content: Thought content normal.        Judgment: Judgment normal.  Results for orders placed or performed in visit on 07/09/19  VITAMIN D 25 Hydroxy (Vit-D Deficiency, Fractures)  Result Value Ref Range   Vit D, 25-Hydroxy 16 (L) 30 - 100 ng/mL  Lipid panel  Result Value Ref Range   Cholesterol 147 <200 mg/dL   HDL 32 (L) > OR = 40 mg/dL   Triglycerides 161209 (H) <150 mg/dL   LDL Cholesterol (Calc) 85 mg/dL (calc)   Total CHOL/HDL Ratio 4.6 <5.0 (calc)   Non-HDL Cholesterol (Calc) 115 <130 mg/dL (calc)  Comprehensive metabolic panel  Result Value Ref Range   Glucose, Bld 170 (H) 65 - 99 mg/dL   BUN 15 7 - 25 mg/dL   Creat 0.960.66 0.450.60 - 4.091.35 mg/dL   BUN/Creatinine Ratio NOT APPLICABLE 6 - 22 (calc)   Sodium 141 135 - 146 mmol/L   Potassium 4.1 3.5 - 5.3 mmol/L   Chloride 104 98 - 110 mmol/L   CO2 29 20 - 32 mmol/L   Calcium 9.3 8.6 - 10.3 mg/dL   Total Protein 6.6 6.1 - 8.1 g/dL   Albumin 4.5 3.6 - 5.1 g/dL   Globulin 2.1 1.9 - 3.7 g/dL (calc)   AG Ratio 2.1 1.0 - 2.5 (calc)   Total Bilirubin 0.5 0.2 - 1.2 mg/dL   Alkaline phosphatase (APISO) 43 36 - 130 U/L   AST 22 10 - 40 U/L   ALT 44 9 - 46 U/L  Hemoglobin A1c  Result Value Ref Range   Hgb A1c MFr Bld 7.2 (H) <5.7 % of total Hgb   Mean Plasma Glucose 160 (calc)   eAG (mmol/L) 8.9 (calc)  Uric acid  Result Value Ref Range   Uric Acid, Serum 5.8 4.0 - 8.0 mg/dL   EKG - NSR rate high 81X90s, mild LAD, normal intervals, no acute ST/T changes, poor R wave progression unchanged from prior EKG 2019,  nonspecific T wave flattening laterally  Assessment & Plan:   Problem List Items Addressed This Visit    Vitamin D deficiency    Levels remain low - restart weekly supplementation (4129yr supply sent to pharmacy)      Severe obesity (BMI 35.0-39.9) with comorbidity (HCC)    Encouraged healthy diet and lifestyle changes to affect sustainable weight loss.       Health maintenance examination - Primary    Preventative protocols reviewed and updated unless pt declined. Discussed healthy diet and lifestyle.       Gout    Uric acid levels stable on allopurinol - continue. No recent gout flares.       Essential hypertension    Chronic, stable. Continue current regimen.       Relevant Medications   amLODipine (NORVASC) 10 MG tablet   atorvastatin (LIPITOR) 20 MG tablet   losartan (COZAAR) 100 MG tablet   Dyslipidemia    Chronic, improving on atorvastatin. Continue. The 10-year ASCVD risk score Denman Lashan(Goff DC Montez HagemanJr., et al., 2013) is: 3.4%   Values used to calculate the score:     Age: [age] years     Sex: Male     Is Non-Hispanic African American: No     Diabetic: Yes     Tobacco smoker: No     Systolic Blood Pressure: 122 mmHg     Is BP treated: Yes     HDL Cholesterol: 32 mg/dL     Total Cholesterol: 147 mg/dL       Relevant Medications   atorvastatin (LIPITOR) 20 MG tablet   Diabetes mellitus  type 2, uncontrolled, with complications (HCC)    Improved A1c. Continue f/u with endo ([facility]) - appt next month       Relevant Medications   atorvastatin (LIPITOR) 20 MG tablet   losartan (COZAAR) 100 MG tablet   Chest tightness    Does not sound cardiac. Reviewed cardiac characteristics in detail with patient. In diabetes history, update EKG. Update if progressive symptoms.       Relevant Orders   EKG 12-Lead    Other Visit Diagnoses    Need for influenza vaccination       Relevant Orders   Flu Vaccine QUAD 36+ mos IM (Completed)       Meds ordered this encounter  Medications   . Cholecalciferol (VITAMIN D3) 1.25 MG (50000 UT) TABS    Sig: Take 1 tablet by mouth once a week.    Dispense:  12 tablet    Refill:  3  . allopurinol (ZYLOPRIM) 300 MG tablet    Sig: TAKE 1 TABLET(300 MG) BY MOUTH DAILY    Dispense:  90 tablet    Refill:  3  . amLODipine (NORVASC) 10 MG tablet    Sig: TAKE 1 TABLET(10 MG) BY MOUTH DAILY    Dispense:  90 tablet    Refill:  3  . atorvastatin (LIPITOR) 20 MG tablet    Sig: TAKE 1 TABLET(20 MG) BY MOUTH DAILY    Dispense:  90 tablet    Refill:  3  . losartan (COZAAR) 100 MG tablet    Sig: TAKE 1 TABLET(100 MG) BY MOUTH DAILY    Dispense:  90 tablet    Refill:  3   Orders Placed This Encounter  Procedures  . Flu Vaccine QUAD 36+ mos IM  . EKG 12-Lead    Follow up plan: Return in about 1 year (around 07/12/2020) for annual exam, prior fasting for blood work.  Eustaquio BoydenJavier Melisa Donofrio, MD

## 2019-08-03 ENCOUNTER — Other Ambulatory Visit: Payer: Self-pay | Admitting: Family Medicine

## 2019-10-29 DIAGNOSIS — U071 COVID-19: Secondary | ICD-10-CM

## 2019-10-29 HISTORY — DX: COVID-19: U07.1

## 2019-11-09 ENCOUNTER — Ambulatory Visit (INDEPENDENT_AMBULATORY_CARE_PROVIDER_SITE_OTHER): Payer: Managed Care, Other (non HMO) | Admitting: Family Medicine

## 2019-11-09 ENCOUNTER — Other Ambulatory Visit: Payer: Self-pay

## 2019-11-09 ENCOUNTER — Encounter: Payer: Self-pay | Admitting: Family Medicine

## 2019-11-09 DIAGNOSIS — U071 COVID-19: Secondary | ICD-10-CM | POA: Diagnosis not present

## 2019-11-09 HISTORY — DX: COVID-19: U07.1

## 2019-11-09 NOTE — Progress Notes (Signed)
Virtual Visit via Video Note  I connected with Kyle [name] on 11/09/19 at  9:30 AM EST by a video enabled telemedicine application and verified that I am speaking with the correct person using two identifiers.  Location: Patient: home (car) Provider: office   I discussed the limitations of evaluation and management by telemedicine and the availability of in person appointments. The patient expressed understanding and agreed to proceed.  Parties involved in encounter  Patient: Kyle [name]  Provider:  Loura Pardon MD    History of Present Illness: 44 yo diabetic pt of Dr Darnell Level presents with Covid 19  He also has diagnoses of HTN and obesity   BP Readings from Last 3 Encounters:  11/09/19 (!) 128/101  07/13/19 122/84  01/08/19 132/82   Pulse is 100  ? From cold medicine  Pulse ox 96%   He was [facility] after presenting for congestion/rhinorrhea and diarrhea on 11/06/19  No known contacts  Found out some people at work had symptoms   Sniffling started last week and by Saturday he felt bad/tested that day  A lot of fatigue Mild labored breathing - feels like he has to stop and take an intentional deep breath sometimes  Perhaps a little wheeze only if he takes a very deep breath  Has OSA - not using cpap right now (he does not tolerate machine)  Aching in back and sides   No chills  Never had a fever  Had diarrhea on Friday - none since (just gas) , and no n/v  Lost taste and smell for a bit - now coming back -not 100%   Deep but very mild cough -not productive No h/o lung problems   Head congestion -not too bad (mucous is clear)  Runny nose is better   Otc: taking tylenol cold (helps a little)  Drinking lots of fluids - he feels a bit dehydrated    Wife and kids are getting tested now   Foggy feeling  A few weak moments  No headache   Blood sugar is 127 - good today No appetite - wife makes sure he is eating something   He is able to work  from home for the next several weeks   Patient Active Problem List   Diagnosis Date Noted  . COVID-19 11/09/2019  . Chest tightness 07/13/2019  . Left flank pain 01/01/2019  . 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 Vitamin D deficiency 06/30/2018  . Fatigue 04/07/2018  . History of kidney stones 07/01/2017  . 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 Health maintenance examination 05/25/2014  . Dyslipidemia 05/25/2014  . Diabetes mellitus type 2, uncontrolled, with complications ([place]) 60/63/0160  . Severe obesity (BMI 35.0-39.9) with comorbidity ([place]) 06/02/2013  . Gout 09/05/2009  . Essential hypertension 01/08/2008  . IBS 01/08/2008  . OSA (obstructive sleep apnea) 01/08/2008  . GERD 05/11/2007   Past Medical History:  Diagnosis Date  . Diabetes type 2, controlled ([place])    Dr Gabriel Carina  . Dyslipidemia 05/25/2014  . Family history of adverse reaction to anesthesia    Mother has postoperatve N&V  . GERD 05/11/2007  . Gout, unspecified 09/05/2009  . History of kidney stones    h/o kidney stones "regularly over the past 5 years"  . HTN (hypertension) 09/08/2008  . IBS 01/08/2008  . SLEEP APNEA 01/08/2008   cannot tolerate CPAP  . THYROID STIMULATING HORMONE, ABNORMAL 10/10/2008   Past Surgical History:  Procedure Laterality Date  .  COLONOSCOPY    . HERNIA REPAIR    . NASAL SEPTOPLASTY W/ TURBINOPLASTY Bilateral 08/28/2018   Procedure: NASAL SEPTOPLASTY WITH TURBINATE REDUCTION;  Surgeon: [name], MD;  Location: [facility] OR;  Service: ENT;  Laterality: Bilateral;  . UPPER GASTROINTESTINAL ENDOSCOPY     Social History   Tobacco Use  . Smoking status: Former Smoker    Quit date: 10/29/2003    Years since quitting: 16.0  . Smokeless tobacco: Former Neurosurgeon    Quit date: 10/29/2003  Substance Use Topics  . Alcohol use: Yes    Comment: Rarely  . Drug use: No   Family History  Problem Relation Age of Onset  . Stroke Other        bilateral grandfathers  . Hypertension Other         strong on maternal side  . Cancer Sister 76       cervical  . Cancer Father 13       prostate s/p radiation seed implant  . CAD Father 2  . Hypothyroidism Father 42  . CAD Mother 93  . CAD Other        bilateral grandfathers  . Cancer Maternal Uncle        colon  . Diabetes Neg Hx    Allergies  Allergen Reactions  . Penicillins Swelling and Other (See Comments)    SWELLING REACTION UNSPECIFIED  Has patient had a PCN reaction causing immediate rash, facial/tongue/throat swelling, SOB or lightheadedness with hypotension: No Has patient had a PCN reaction causing severe rash involving mucus membranes or skin necrosis: No Has patient had a PCN reaction that required hospitalization: No Has patient had a PCN reaction occurring within the last 10 years: No If all of the above answers are "NO", then may proceed with Cephalosporin use.    Current Outpatient Medications on File Prior to Visit  Medication Sig Dispense Refill  . allopurinol (ZYLOPRIM) 300 MG tablet TAKE 1 TABLET(300 MG) BY MOUTH DAILY 90 tablet 3  . amLODipine (NORVASC) 10 MG tablet TAKE 1 TABLET(10 MG) BY MOUTH DAILY 90 tablet 3  . Ascorbic Acid (VITAMIN C) 500 MG CAPS Take 1 capsule by mouth daily. (Patient taking differently: Take 500 mg by mouth daily. ) 60 capsule   . atorvastatin (LIPITOR) 20 MG tablet TAKE 1 TABLET(20 MG) BY MOUTH DAILY 90 tablet 3  . Cholecalciferol (VITAMIN D3) 1.25 MG (50000 UT) TABS Take 1 tablet by mouth once a week. 12 tablet 3  . empagliflozin (JARDIANCE) 25 MG TABS tablet Take 25 mg by mouth daily.    . Hypromellose (ARTIFICIAL TEARS OP) Place 2 drops into both eyes 2 (two) times daily as needed (for dry eyes).     Marland Kitchen losartan (COZAAR) 100 MG tablet TAKE 1 TABLET BY MOUTH DAILY 90 tablet 3  . metFORMIN (GLUCOPHAGE) 500 MG tablet Take 1,000 mg by mouth daily with breakfast.      No current facility-administered medications on file prior to visit.   Review of Systems  Constitutional:  Positive for malaise/fatigue. Negative for chills and fever.  HENT: Positive for congestion. Negative for ear pain, sinus pain and sore throat.   Eyes: Negative for blurred vision, discharge and redness.  Respiratory: Positive for cough, shortness of breath and wheezing. Negative for sputum production and stridor.   Cardiovascular: Negative for chest pain, palpitations and leg swelling.  Gastrointestinal: Negative for abdominal pain, diarrhea, nausea and vomiting.  Musculoskeletal: Positive for myalgias.  Skin: Negative for rash.  Neurological:  Negative for dizziness and headaches.      Observations/Objective: Patient appears well, in no distress Weight is baseline  No facial swelling or asymmetry Normal voice-not hoarse and no slurred speech No obvious tremor or mobility impairment Moving neck and UEs normally Able to hear the call well  No cough or shortness of breath during interview, pt does occ clear throat  Talkative and mentally sharp with no cognitive changes No skin changes on face or neck , no rash or pallor Affect is normal    Assessment and Plan: Problem List Items Addressed This Visit      Other   COVID-19    Symptoms started jan 7, pos test jan 9th (now family has symptoms and getting tested) Is isolating himself  Symptoms are mild (diarrhea resolved, smell and taste are returning and runny nose has slowed down)  Risk factors are diabetes and obesity Cold medicine (tylenol cold) may be running his BP up  Recommended fluids/rest /isolation  Plans to be home for 2 wk or more and he can work from home when he feels up to it  inst to call/alert [place] if any symptoms suddenly worsen or new ones develop He will continue to watch temp and vitals           Follow Up Instructions: Rest and continue to isolate yourself  Treat the symptoms you have with over the counter medications Continue to track your temperature  Alert [place] if anything worsens (especially cough or  shortness of breath)  Also if any symptoms persist beyond 2 weeks    I discussed the assessment and treatment plan with the patient. The patient was provided an opportunity to ask questions and all were answered. The patient agreed with the plan and demonstrated an understanding of the instructions.   The patient was advised to call back or seek an in-person evaluation if the symptoms worsen or if the condition fails to improve as anticipated.  Roxy Manns, MD

## 2019-11-09 NOTE — Assessment & Plan Note (Addendum)
Symptoms started jan 7, pos test jan 9th (now family has symptoms and getting tested) Is isolating himself  Symptoms are mild (diarrhea resolved, smell and taste are returning and runny nose has slowed down)  Risk factors are diabetes and obesity Cold medicine (tylenol cold) may be running his BP up  Recommended fluids/rest /isolation  Plans to be home for 2 wk or more and he can work from home when he feels up to it  inst to call/alert [place] if any symptoms suddenly worsen or new ones develop He will continue to watch temp and vitals

## 2019-11-09 NOTE — Patient Instructions (Signed)
Rest and continue to isolate yourself  Treat the symptoms you have with over the counter medications Continue to track your temperature  Alert [place] if anything worsens (especially cough or shortness of breath)  Also if any symptoms persist beyond 2 weeks

## 2019-11-26 ENCOUNTER — Telehealth: Payer: Self-pay | Admitting: Family Medicine

## 2019-11-26 ENCOUNTER — Encounter: Payer: Self-pay | Admitting: Family Medicine

## 2019-11-26 MED ORDER — HYDROXYZINE HCL 25 MG PO TABS: 25.0000 mg | ORAL_TABLET | Freq: Every evening | ORAL | 0 refills | Status: DC | PRN

## 2019-11-26 NOTE — Telephone Encounter (Signed)
Spoke with patient. Will send in hydroxyzine for him  I asked him to keep [place] updated on status.

## 2019-11-26 NOTE — Telephone Encounter (Signed)
Pt called wanting to see if you could call him in something to help sleep at night.  His wife Radiation protection practitioner) is @ [employer] for a week  He needs something to help at night but not make him so groggy during the day he has 2 kids to take care of    [facility] [place] Please advise if something has been called in

## 2020-07-13 ENCOUNTER — Other Ambulatory Visit: Payer: Self-pay | Admitting: Family Medicine

## 2020-07-13 ENCOUNTER — Other Ambulatory Visit (INDEPENDENT_AMBULATORY_CARE_PROVIDER_SITE_OTHER): Payer: Managed Care, Other (non HMO)

## 2020-07-13 ENCOUNTER — Other Ambulatory Visit: Payer: Self-pay

## 2020-07-13 DIAGNOSIS — E785 Hyperlipidemia, unspecified: Secondary | ICD-10-CM

## 2020-07-13 DIAGNOSIS — E559 Vitamin D deficiency, unspecified: Secondary | ICD-10-CM

## 2020-07-13 DIAGNOSIS — IMO0002 Reserved for concepts with insufficient information to code with codable children: Secondary | ICD-10-CM

## 2020-07-13 DIAGNOSIS — E118 Type 2 diabetes mellitus with unspecified complications: Secondary | ICD-10-CM | POA: Diagnosis not present

## 2020-07-13 DIAGNOSIS — M1A9XX0 Chronic gout, unspecified, without tophus (tophi): Secondary | ICD-10-CM

## 2020-07-13 DIAGNOSIS — E1165 Type 2 diabetes mellitus with hyperglycemia: Secondary | ICD-10-CM | POA: Diagnosis not present

## 2020-07-13 LAB — COMPREHENSIVE METABOLIC PANEL
ALT: 40 U/L (ref 0–53)
AST: 20 U/L (ref 0–37)
Albumin: 4.6 g/dL (ref 3.5–5.2)
Alkaline Phosphatase: 43 U/L (ref 39–117)
BUN: 20 mg/dL (ref 6–23)
CO2: 29 mEq/L (ref 19–32)
Calcium: 9.2 mg/dL (ref 8.4–10.5)
Chloride: 101 mEq/L (ref 96–112)
Creatinine, Ser: 0.71 mg/dL (ref 0.40–1.50)
GFR: 120.28 mL/min (ref >60.00)
Glucose, Bld: 167 mg/dL — ABNORMAL HIGH (ref 70–99)
Potassium: 4 mEq/L (ref 3.5–5.1)
Sodium: 138 mEq/L (ref 135–145)
Total Bilirubin: 0.6 mg/dL (ref 0.2–1.2)
Total Protein: 6.9 g/dL (ref 6.0–8.3)

## 2020-07-13 LAB — HEMOGLOBIN A1C: Hgb A1c MFr Bld: 8 % — ABNORMAL HIGH (ref 4.6–6.5)

## 2020-07-13 LAB — LIPID PANEL
Cholesterol: 135 mg/dL (ref 0–200)
HDL: 27 mg/dL — ABNORMAL LOW (ref >39.00)
Total CHOL/HDL Ratio: 5
Triglycerides: 636 mg/dL — ABNORMAL HIGH (ref 0.0–149.0)

## 2020-07-13 LAB — LDL CHOLESTEROL, DIRECT: Direct LDL: 42 mg/dL

## 2020-07-13 LAB — VITAMIN D 25 HYDROXY (VIT D DEFICIENCY, FRACTURES): VITD: 27.57 ng/mL — ABNORMAL LOW (ref 30.00–100.00)

## 2020-07-13 LAB — URIC ACID: Uric Acid, Serum: 4.5 mg/dL (ref 4.0–7.8)

## 2020-07-14 ENCOUNTER — Other Ambulatory Visit: Payer: Self-pay | Admitting: Family Medicine

## 2020-07-17 ENCOUNTER — Ambulatory Visit (INDEPENDENT_AMBULATORY_CARE_PROVIDER_SITE_OTHER): Payer: Managed Care, Other (non HMO) | Admitting: Family Medicine

## 2020-07-17 ENCOUNTER — Encounter: Payer: Self-pay | Admitting: Family Medicine

## 2020-07-17 ENCOUNTER — Other Ambulatory Visit: Payer: Self-pay

## 2020-07-17 VITALS — BP 162/104 | HR 89 | Temp 97.8°F | Ht 74.0 in | Wt 266.5 lb

## 2020-07-17 DIAGNOSIS — E1169 Type 2 diabetes mellitus with other specified complication: Secondary | ICD-10-CM

## 2020-07-17 DIAGNOSIS — Z0001 Encounter for general adult medical examination with abnormal findings: Secondary | ICD-10-CM

## 2020-07-17 DIAGNOSIS — M1A9XX0 Chronic gout, unspecified, without tophus (tophi): Secondary | ICD-10-CM | POA: Diagnosis not present

## 2020-07-17 DIAGNOSIS — G4733 Obstructive sleep apnea (adult) (pediatric): Secondary | ICD-10-CM

## 2020-07-17 DIAGNOSIS — F4323 Adjustment disorder with mixed anxiety and depressed mood: Secondary | ICD-10-CM

## 2020-07-17 DIAGNOSIS — IMO0002 Reserved for concepts with insufficient information to code with codable children: Secondary | ICD-10-CM

## 2020-07-17 DIAGNOSIS — I10 Essential (primary) hypertension: Secondary | ICD-10-CM

## 2020-07-17 DIAGNOSIS — E1165 Type 2 diabetes mellitus with hyperglycemia: Secondary | ICD-10-CM

## 2020-07-17 DIAGNOSIS — E785 Hyperlipidemia, unspecified: Secondary | ICD-10-CM

## 2020-07-17 DIAGNOSIS — E559 Vitamin D deficiency, unspecified: Secondary | ICD-10-CM

## 2020-07-17 DIAGNOSIS — Z23 Encounter for immunization: Secondary | ICD-10-CM | POA: Diagnosis not present

## 2020-07-17 DIAGNOSIS — E118 Type 2 diabetes mellitus with unspecified complications: Secondary | ICD-10-CM

## 2020-07-17 MED ORDER — TRAZODONE HCL 50 MG PO TABS: 25.0000 mg | ORAL_TABLET | Freq: Every evening | ORAL | 3 refills | Status: DC | PRN

## 2020-07-17 MED ORDER — AMLODIPINE BESYLATE 10 MG PO TABS
ORAL_TABLET | ORAL | 3 refills | Status: DC
Start: 2020-07-17 — End: 2021-07-13

## 2020-07-17 MED ORDER — ATORVASTATIN CALCIUM 20 MG PO TABS
ORAL_TABLET | ORAL | 3 refills | Status: DC
Start: 2020-07-17 — End: 2021-07-30

## 2020-07-17 MED ORDER — ALLOPURINOL 300 MG PO TABS
ORAL_TABLET | ORAL | 3 refills | Status: DC
Start: 2020-07-17 — End: 2021-07-13

## 2020-07-17 MED ORDER — VITAMIN D3 1.25 MG (50000 UT) PO TABS
1.0000 | ORAL_TABLET | ORAL | 3 refills | Status: DC
Start: 2020-07-17 — End: 2021-11-16

## 2020-07-17 MED ORDER — LOSARTAN POTASSIUM 100 MG PO TABS
ORAL_TABLET | ORAL | 3 refills | Status: DC
Start: 2020-07-17 — End: 2021-11-05

## 2020-07-17 NOTE — Assessment & Plan Note (Signed)
Urate levels low on allouprinol - continue

## 2020-07-17 NOTE — Assessment & Plan Note (Signed)
Chronic, above goal.  Call for endo f/u.

## 2020-07-17 NOTE — Assessment & Plan Note (Signed)
Restart weekly supplementation.

## 2020-07-17 NOTE — Assessment & Plan Note (Signed)
Encouraged healthy diet and lifestyle changes to affect sustainable weight loss.  

## 2020-07-17 NOTE — Assessment & Plan Note (Signed)
Chronic, LDL at goal but triglycerides markedly high.  Reviewed dangers of pancreatitis at this high level.  He will work on diet changes to control levels, reassess control at 38mo f/u.  The 10-year ASCVD risk score Denman Vinh DC Montez Hageman., et al., 2013) is: 6.8%   Values used to calculate the score:     Age: 44 years     Sex: Male     Is Non-Hispanic African American: No     Diabetic: Yes     Tobacco smoker: No     Systolic Blood Pressure: 162 mmHg     Is BP treated: Yes     HDL Cholesterol: 27 mg/dL     Total Cholesterol: 135 mg/dL

## 2020-07-17 NOTE — Assessment & Plan Note (Signed)
Notes difficulty since wife passed away from COVID 12/13/2019. Support provided. He already sees grief counselor. Will Rx trazodone PRN sleep in effort to decrease alcohol use. Discussed mood medication option.

## 2020-07-17 NOTE — Progress Notes (Signed)
This visit was conducted in person.  BP (!) 162/104 (BP Location: Left Arm, Patient Position: Sitting, Cuff Size: Large)   Pulse 89   Temp 97.8 F (36.6 C) (Temporal)   Ht 6\' 2"  (1.88 m)   Wt 266 lb 8 oz (120.9 kg)   SpO2 96%   BMI 34.22 kg/m   BP Readings from Last 3 Encounters:  07/17/20 (!) 162/104  11/09/19 (!) 128/101  07/13/19 122/84    CC: CPE Subjective:    Patient ID: 07/15/19, male    DOB: [birth year], 44 y.o.   MRN: [id number]  HPI: Kyle [name] is a 44 y.o. male presenting on 07/17/2020 for Annual Exam   Wife passed away from COVID 2020/01/05.  Stressful time - affecting ability to take care of himself. Takes meds about 75% of the time. Eating more fast food and junk food than normal. Seeing grief counselor. Using 2-3 beer at night to help sleep.   DM followed by endo ([facility]).  Gout - 1 mild flare (knees aching) when off daily allopurinol (01/2020) - now back on this.   OSA intolerant of CPAP - s/p nasal septoplasty/turbinate reduction 08/2018 09/2018), now using dental appliance  HTN - Compliant with current antihypertensive regimen of amlodipine 10mg  losartan 100mg  daily. Does not check blood pressures at home. No low blood pressure readings or symptoms of dizziness/syncope. Denies vision changes, CP/tightness, SOB, leg swelling. Occasional headaches   Notes he gets easily dehydrated.   Preventative: Colon cancer screening - colonoscopy 2007. fmhx colon cancer (uncle)  Prostate cancer in father age 62 - father passed away from this (mets)  Flu shotyearly Td 2009, Tdap 2019 Pneumovax 2015.  COVID vaccine 2010 01/2020, 02/2020  Seat belt use discussed  Sunscreen use discussed. No changing moles.  Non smoker  Alcohol - increase - to 2-3 beers at night on weekdays Dentist Q6 mo - had root canal this year  Eye exam yearly   Caffeine: 4 cups/day Widower. Wife passed from COVID 2020-01-05. Lives with 2 children (adopted from 02/2020)  Occupation: 03/2020  at [employer] Activity:no regular exercise Diet: good water daily, fruits/vegetables some     Relevant past medical, surgical, family and social history reviewed and updated as indicated. Interim medical history since our last visit reviewed. Allergies and medications reviewed and updated. Outpatient Medications Prior to Visit  Medication Sig Dispense Refill  . Ascorbic Acid (VITAMIN C) 500 MG CAPS Take 1 capsule by mouth daily. (Patient taking differently: Take 500 mg by mouth daily. ) 60 capsule   . empagliflozin (JARDIANCE) 25 MG TABS tablet Take 25 mg by mouth daily.    . Hypromellose (ARTIFICIAL TEARS OP) Place 2 drops into both eyes 2 (two) times daily as needed (for dry eyes).     . metFORMIN (GLUCOPHAGE) 500 MG tablet Take 1,000 mg by mouth daily with breakfast.     . allopurinol (ZYLOPRIM) 300 MG tablet TAKE 1 TABLET(300 MG) BY MOUTH DAILY 90 tablet 3  . amLODipine (NORVASC) 10 MG tablet TAKE 1 TABLET(10 MG) BY MOUTH DAILY 90 tablet 3  . atorvastatin (LIPITOR) 20 MG tablet TAKE 1 TABLET(20 MG) BY MOUTH DAILY 90 tablet 3  . Cholecalciferol (VITAMIN D3) 1.25 MG (50000 UT) TABS Take 1 tablet by mouth once a week. 12 tablet 3  . hydrOXYzine (ATARAX/VISTARIL) 25 MG tablet Take 1 tablet (25 mg total) by mouth at bedtime as needed for anxiety (sleep). 30 tablet 0  . losartan (COZAAR)  100 MG tablet TAKE 1 TABLET BY MOUTH DAILY 90 tablet 3   No facility-administered medications prior to visit.     Per HPI unless specifically indicated in ROS section below Review of Systems  Constitutional: Negative for activity change, appetite change, chills, fatigue, fever and unexpected weight change.  HENT: Negative for hearing loss.   Eyes: Negative for visual disturbance.  Respiratory: Negative for cough, chest tightness, shortness of breath and wheezing.   Cardiovascular: Negative for chest pain, palpitations and leg swelling.  Gastrointestinal: Negative for abdominal  distention, abdominal pain, blood in stool, constipation, diarrhea, nausea and vomiting.  Genitourinary: Negative for difficulty urinating and hematuria.  Musculoskeletal: Negative for arthralgias, myalgias and neck pain.  Skin: Negative for rash.  Neurological: Negative for dizziness, seizures, syncope and headaches (occasional).  Hematological: Negative for adenopathy. Does not bruise/bleed easily.  Psychiatric/Behavioral: Positive for dysphoric mood. The patient is nervous/anxious.    Objective:  BP (!) 162/104 (BP Location: Left Arm, Patient Position: Sitting, Cuff Size: Large)   Pulse 89   Temp 97.8 F (36.6 C) (Temporal)   Ht 6\' 2"  (1.88 m)   Wt 266 lb 8 oz (120.9 kg)   SpO2 96%   BMI 34.22 kg/m   Wt Readings from Last 3 Encounters:  07/17/20 266 lb 8 oz (120.9 kg)  07/13/19 265 lb 5 oz (120.3 kg)  01/08/19 258 lb 4 oz (117.1 kg)      Physical Exam Vitals and nursing note reviewed.  Constitutional:      General: He is not in acute distress.    Appearance: Normal appearance. He is well-developed. He is not ill-appearing.  HENT:     Head: Normocephalic and atraumatic.     Right Ear: Hearing, tympanic membrane, ear canal and external ear normal.     Left Ear: Hearing, tympanic membrane, ear canal and external ear normal.  Eyes:     General: No scleral icterus.    Extraocular Movements: Extraocular movements intact.     Conjunctiva/sclera: Conjunctivae normal.     Pupils: Pupils are equal, round, and reactive to light.  Neck:     Thyroid: No thyroid mass or thyromegaly.  Cardiovascular:     Rate and Rhythm: Normal rate and regular rhythm.     Pulses: Normal pulses.          Radial pulses are 2+ on the right side and 2+ on the left side.     Heart sounds: Normal heart sounds. No murmur heard.   Pulmonary:     Effort: Pulmonary effort is normal. No respiratory distress.     Breath sounds: Normal breath sounds. No wheezing, rhonchi or rales.  Abdominal:     General:  Abdomen is flat. Bowel sounds are normal. There is no distension.     Palpations: Abdomen is soft. There is no mass.     Tenderness: There is no abdominal tenderness. There is no guarding or rebound.     Hernia: No hernia is present.  Musculoskeletal:        General: Normal range of motion.     Cervical back: Normal range of motion and neck supple.     Right lower leg: No edema.     Left lower leg: No edema.  Lymphadenopathy:     Cervical: No cervical adenopathy.  Skin:    General: Skin is warm and dry.     Findings: No rash.  Neurological:     General: No focal deficit present.  Mental Status: He is alert and oriented to person, place, and time.     Comments: CN grossly intact, station and gait intact  Psychiatric:        Mood and Affect: Mood normal.        Behavior: Behavior normal.        Thought Content: Thought content normal.        Judgment: Judgment normal.       Results for orders placed or performed in visit on 07/13/20  VITAMIN D 25 Hydroxy (Vit-D Deficiency, Fractures)  Result Value Ref Range   VITD 27.57 (L) 30.00 - 100.00 ng/mL  Uric acid  Result Value Ref Range   Uric Acid, Serum 4.5 4.0 - 7.8 mg/dL  Hemoglobin A5W  Result Value Ref Range   Hgb A1c MFr Bld 8.0 (H) 4.6 - 6.5 %  Comprehensive metabolic panel  Result Value Ref Range   Sodium 138 135 - 145 mEq/L   Potassium 4.0 3.5 - 5.1 mEq/L   Chloride 101 96 - 112 mEq/L   CO2 29 19 - 32 mEq/L   Glucose, Bld 167 (H) 70 - 99 mg/dL   BUN 20 6 - 23 mg/dL   Creatinine, Ser 0.98 0.40 - 1.50 mg/dL   Total Bilirubin 0.6 0.2 - 1.2 mg/dL   Alkaline Phosphatase 43 39 - 117 U/L   AST 20 0 - 37 U/L   ALT 40 0 - 53 U/L   Total Protein 6.9 6.0 - 8.3 g/dL   Albumin 4.6 3.5 - 5.2 g/dL   GFR 119.14 >78.29 mL/min   Calcium 9.2 8.4 - 10.5 mg/dL  Lipid panel  Result Value Ref Range   Cholesterol 135 0 - 200 mg/dL   Triglycerides (H) 0 - 149 mg/dL    562.1 Triglyceride is over 400; calculations on Lipids are  invalid.   HDL 27.00 (L) >39.00 mg/dL   Total CHOL/HDL Ratio 5   LDL cholesterol, direct  Result Value Ref Range   Direct LDL 42.0 mg/dL   Depression screen [facility] 2/9 07/17/2020 07/13/2019 07/03/2018 07/01/2017  Decreased Interest 1 0 0 0  Down, Depressed, Hopeless 1 0 0 0  PHQ - 2 Score 2 0 0 0  Altered sleeping 3 - - -  Tired, decreased energy 2 - - -  Change in appetite 1 - - -  Feeling bad or failure about yourself  0 - - -  Trouble concentrating 1 - - -  Moving slowly or fidgety/restless 0 - - -  Suicidal thoughts 0 - - -  PHQ-9 Score 9 - - -    GAD 7 : Generalized Anxiety Score 07/17/2020  Nervous, Anxious, on Edge 2  Control/stop worrying 1  Worry too much - different things 1  Trouble relaxing 2  Restless 0  Easily annoyed or irritable 2  Afraid - awful might happen 0  Total GAD 7 Score 8   Assessment & Plan:  This visit occurred during the SARS-CoV-2 public health emergency.  Safety protocols were in place, including screening questions prior to the visit, additional usage of staff PPE, and extensive cleaning of exam room while observing appropriate contact time as indicated for disinfecting solutions.   Problem List Items Addressed This Visit    Vitamin D deficiency    Restart weekly supplementation.       Severe obesity (BMI 35.0-39.9) with comorbidity (HCC)    Encouraged healthy diet and lifestyle changes to affect sustainable weight loss.  OSA (obstructive sleep apnea)   Gout    Urate levels low on allouprinol - continue      Essential hypertension    Chronic ,deteriorated. Reviewed healthy diet and lifestyle changes to improve control. Pt declines med changes at this time, prefers to work on healthy diet/lifestyle changes. RTC 2 mo f/u visit.       Relevant Medications   losartan (COZAAR) 100 MG tablet   amLODipine (NORVASC) 10 MG tablet   atorvastatin (LIPITOR) 20 MG tablet   Encounter for general adult medical examination with abnormal findings -  Primary    Preventative protocols reviewed and updated unless pt declined. Discussed healthy diet and lifestyle.       Dyslipidemia associated with type 2 diabetes mellitus (HCC)    Chronic, LDL at goal but triglycerides markedly high.  Reviewed dangers of pancreatitis at this high level.  He will work on diet changes to control levels, reassess control at 70mo f/u.  The 10-year ASCVD risk score Denman Twan DC Montez Hageman., et al., 2013) is: 6.8%   Values used to calculate the score:     Age: 58 years     Sex: Male     Is Non-Hispanic African American: No     Diabetic: Yes     Tobacco smoker: No     Systolic Blood Pressure: 162 mmHg     Is BP treated: Yes     HDL Cholesterol: 27 mg/dL     Total Cholesterol: 135 mg/dL       Relevant Medications   losartan (COZAAR) 100 MG tablet   atorvastatin (LIPITOR) 20 MG tablet   Diabetes mellitus type 2, uncontrolled, with complications (HCC)    Chronic, above goal.  Call for endo f/u.       Relevant Medications   losartan (COZAAR) 100 MG tablet   atorvastatin (LIPITOR) 20 MG tablet   Adjustment disorder with mixed anxiety and depressed mood    Notes difficulty since wife passed away from COVID December 25, 2019. Support provided. He already sees grief counselor. Will Rx trazodone PRN sleep in effort to decrease alcohol use. Discussed mood medication option.        Other Visit Diagnoses    Need for influenza vaccination       Relevant Orders   Flu Vaccine QUAD 36+ mos IM (Completed)       Meds ordered this encounter  Medications  . losartan (COZAAR) 100 MG tablet    Sig: TAKE 1 TABLET BY MOUTH DAILY    Dispense:  90 tablet    Refill:  3  . allopurinol (ZYLOPRIM) 300 MG tablet    Sig: TAKE 1 TABLET(300 MG) BY MOUTH DAILY    Dispense:  90 tablet    Refill:  3  . amLODipine (NORVASC) 10 MG tablet    Sig: TAKE 1 TABLET(10 MG) BY MOUTH DAILY    Dispense:  90 tablet    Refill:  3  . atorvastatin (LIPITOR) 20 MG tablet    Sig: TAKE 1 TABLET(20 MG) BY  MOUTH DAILY    Dispense:  90 tablet    Refill:  3  . Cholecalciferol (VITAMIN D3) 1.25 MG (50000 UT) TABS    Sig: Take 1 tablet by mouth once a week.    Dispense:  12 tablet    Refill:  3  . traZODone (DESYREL) 50 MG tablet    Sig: Take 0.5-1 tablets (25-50 mg total) by mouth at bedtime as needed for sleep.    Dispense:  30  tablet    Refill:  3   Orders Placed This Encounter  Procedures  . Flu Vaccine QUAD 36+ mos IM    Patient instructions: Flu shot today.  Start watching BP at home and let me know if staying consistently >140/90 despite taking BP meds regularly (amlodipine and losartan).  Working on diet, sleep, stressors all can help lower blood pressure.  Try trazodone 50mg  1/2-1 tablet at night for sleep.  Call to reschedule endocrinology follow up.  Return in 2 months for blood pressure follow up visit.   Follow up plan: Return in about 2 months (around 09/16/2020) for follow up visit.  09/18/2020, MD

## 2020-07-17 NOTE — Assessment & Plan Note (Signed)
Chronic ,deteriorated. Reviewed healthy diet and lifestyle changes to improve control. Pt declines med changes at this time, prefers to work on healthy diet/lifestyle changes. RTC 2 mo f/u visit.

## 2020-07-17 NOTE — Assessment & Plan Note (Signed)
Preventative protocols reviewed and updated unless pt declined. Discussed healthy diet and lifestyle.  

## 2020-07-17 NOTE — Patient Instructions (Addendum)
Flu shot today.  Start watching BP at home and let me know if staying consistently >140/90 despite taking BP meds regularly (amlodipine and losartan).  Working on diet, sleep, stressors all can help lower blood pressure.  Try trazodone 50mg  1/2-1 tablet at night for sleep.  Call to reschedule endocrinology follow up.  Return in 2 months for blood pressure follow up visit.   Health Maintenance, Male Adopting a healthy lifestyle and getting preventive care are important in promoting health and wellness. Ask your health care provider about:  The right schedule for you to have regular tests and exams.  Things you can do on your own to prevent diseases and keep yourself healthy. What should I know about diet, weight, and exercise? Eat a healthy diet   Eat a diet that includes plenty of vegetables, fruits, low-fat dairy products, and lean protein.  Do not eat a lot of foods that are high in solid fats, added sugars, or sodium. Maintain a healthy weight Body mass index (BMI) is a measurement that can be used to identify possible weight problems. It estimates body fat based on height and weight. Your health care provider can help determine your BMI and help you achieve or maintain a healthy weight. Get regular exercise Get regular exercise. This is one of the most important things you can do for your health. Most adults should:  Exercise for at least 150 minutes each week. The exercise should increase your heart rate and make you sweat (moderate-intensity exercise).  Do strengthening exercises at least twice a week. This is in addition to the moderate-intensity exercise.  Spend less time sitting. Even light physical activity can be beneficial. Watch cholesterol and blood lipids Have your blood tested for lipids and cholesterol at 44 years of age, then have this test every 5 years. You may need to have your cholesterol levels checked more often if:  Your lipid or cholesterol levels are  high.  You are older than 44 years of age.  You are at high risk for heart disease. What should I know about cancer screening? Many types of cancers can be detected early and may often be prevented. Depending on your health history and family history, you may need to have cancer screening at various ages. This may include screening for:  Colorectal cancer.  Prostate cancer.  Skin cancer.  Lung cancer. What should I know about heart disease, diabetes, and high blood pressure? Blood pressure and heart disease  High blood pressure causes heart disease and increases the risk of stroke. This is more likely to develop in people who have high blood pressure readings, are of African descent, or are overweight.  Talk with your health care provider about your target blood pressure readings.  Have your blood pressure checked: ? Every 3-5 years if you are 11-74 years of age. ? Every year if you are 65 years old or older.  If you are between the ages of 61 and 77 and are a current or former smoker, ask your health care provider if you should have a one-time screening for abdominal aortic aneurysm (AAA). Diabetes Have regular diabetes screenings. This checks your fasting blood sugar level. Have the screening done:  Once every three years after age 60 if you are at a normal weight and have a low risk for diabetes.  More often and at a younger age if you are overweight or have a high risk for diabetes. What should I know about preventing infection? Hepatitis B If  you have a higher risk for hepatitis B, you should be screened for this virus. Talk with your health care provider to find out if you are at risk for hepatitis B infection. Hepatitis C Blood testing is recommended for:  Everyone born from [birth year] through 1965.  Anyone with known risk factors for hepatitis C. Sexually transmitted infections (STIs)  You should be screened each year for STIs, including gonorrhea and chlamydia,  if: ? You are sexually active and are younger than 45 years of age. ? You are older than 44 years of age and your health care provider tells you that you are at risk for this type of infection. ? Your sexual activity has changed since you were last screened, and you are at increased risk for chlamydia or gonorrhea. Ask your health care provider if you are at risk.  Ask your health care provider about whether you are at high risk for HIV. Your health care provider may recommend a prescription medicine to help prevent HIV infection. If you choose to take medicine to prevent HIV, you should first get tested for HIV. You should then be tested every 3 months for as long as you are taking the medicine. Follow these instructions at home: Lifestyle  Do not use any products that contain nicotine or tobacco, such as cigarettes, e-cigarettes, and chewing tobacco. If you need help quitting, ask your health care provider.  Do not use street drugs.  Do not share needles.  Ask your health care provider for help if you need support or information about quitting drugs. Alcohol use  Do not drink alcohol if your health care provider tells you not to drink.  If you drink alcohol: ? Limit how much you have to 0-2 drinks a day. ? Be aware of how much alcohol is in your drink. In the [place] one drink equals one 12 oz bottle of beer (355 mL), one 5 oz glass of wine (148 mL), or one 1 oz glass of hard liquor (44 mL). General instructions  Schedule regular health, dental, and eye exams.  Stay current with your vaccines.  Tell your health care provider if: ? You often feel depressed. ? You have ever been abused or do not feel safe at home. Summary  Adopting a healthy lifestyle and getting preventive care are important in promoting health and wellness.  Follow your health care provider's instructions about healthy diet, exercising, and getting tested or screened for diseases.  Follow your health care  provider's instructions on monitoring your cholesterol and blood pressure. This information is not intended to replace advice given to you by your health care provider. Make sure you discuss any questions you have with your health care provider. Document Revised: 10/07/2018 Document Reviewed: 10/07/2018 Elsevier Patient Education  2020 [employer].

## 2020-07-20 ENCOUNTER — Encounter: Payer: Managed Care, Other (non HMO) | Admitting: Family Medicine

## 2020-09-18 ENCOUNTER — Ambulatory Visit: Payer: Managed Care, Other (non HMO) | Admitting: Family Medicine

## 2020-09-18 ENCOUNTER — Encounter: Payer: Self-pay | Admitting: Family Medicine

## 2020-09-18 ENCOUNTER — Other Ambulatory Visit: Payer: Self-pay

## 2020-09-18 VITALS — BP 124/82 | HR 90 | Temp 97.8°F | Ht 74.0 in | Wt 266.2 lb

## 2020-09-18 DIAGNOSIS — F4323 Adjustment disorder with mixed anxiety and depressed mood: Secondary | ICD-10-CM | POA: Diagnosis not present

## 2020-09-18 DIAGNOSIS — R109 Unspecified abdominal pain: Secondary | ICD-10-CM

## 2020-09-18 DIAGNOSIS — I10 Essential (primary) hypertension: Secondary | ICD-10-CM | POA: Diagnosis not present

## 2020-09-18 LAB — POC URINALSYSI DIPSTICK (AUTOMATED)
Bilirubin, UA: NEGATIVE
Blood, UA: NEGATIVE
Glucose, UA: POSITIVE — AB
Ketones, UA: NEGATIVE
Leukocytes, UA: NEGATIVE
Nitrite, UA: NEGATIVE
Protein, UA: NEGATIVE
Spec Grav, UA: 1.025 (ref 1.010–1.025)
Urobilinogen, UA: 0.2 E.U./dL
pH, UA: 5.5 (ref 5.0–8.0)

## 2020-09-18 MED ORDER — TRAZODONE HCL 50 MG PO TABS: 25.0000 mg | ORAL_TABLET | Freq: Every evening | ORAL | 3 refills | Status: DC | PRN

## 2020-09-18 NOTE — Assessment & Plan Note (Signed)
Chronic, improved. Continue current regimen of amlodipine and losartan.

## 2020-09-18 NOTE — Progress Notes (Signed)
Patient ID: ACETON KINNEAR, male    DOB: 04-08-1976, 44 y.o.   MRN: [id number]  This visit was conducted in person.  BP 124/82 (BP Location: Left Arm, Patient Position: Sitting, Cuff Size: Large)   Pulse 90   Temp 97.8 F (36.6 C) (Temporal)   Ht 6\' 2"  (1.88 m)   Wt 266 lb 3 oz (120.7 kg)   SpO2 96%   BMI 34.18 kg/m   BP Readings from Last 3 Encounters:  09/18/20 124/82  07/17/20 (!) 162/104  11/09/19 (!) 128/101    CC: HTN f/u visit  Subjective:   HPI: JARQUEZ MESTRE is a 44 y.o. male presenting on 09/18/2020 for Hypertension (Here for f/u.)   See prior note for details.  Widower. Increased stressors - children are teenagers. Continues seeing hospice counselor.   Ongoing L flank pain with radiation down left abdomen into groin. Intermittent episodes, but pain can be sharp/severe when it happens. More common when dehydrated. No blood in urine or stool, no urinary changes, n/v/constipation. Chronic diarrhea. Remote colonoscopy normal.   Has decreased alcohol nightly. He has started taking trazodone 50mg  daily - doing well with this.   HTN - Compliant with current antihypertensive regimen of amlodipine 10mg  daily, losartan 100mg  daily. Does not check blood pressures at home. No low blood pressure readings or symptoms of dizziness/syncope. Denies HA, vision changes, CP/tightness, SOB, leg swelling. Limits sugared drinks, limits salt/sodium in the diet. Drinks 6-7 bottles water/day.      Relevant past medical, surgical, family and social history reviewed and updated as indicated. Interim medical history since our last visit reviewed. Allergies and medications reviewed and updated. Outpatient Medications Prior to Visit  Medication Sig Dispense Refill  . allopurinol (ZYLOPRIM) 300 MG tablet TAKE 1 TABLET(300 MG) BY MOUTH DAILY 90 tablet 3  . amLODipine (NORVASC) 10 MG tablet TAKE 1 TABLET(10 MG) BY MOUTH DAILY 90 tablet 3  . Ascorbic Acid (VITAMIN C) 500 MG CAPS Take 1 capsule  by mouth daily. (Patient taking differently: Take 500 mg by mouth daily. ) 60 capsule   . atorvastatin (LIPITOR) 20 MG tablet TAKE 1 TABLET(20 MG) BY MOUTH DAILY 90 tablet 3  . Cholecalciferol (VITAMIN D3) 1.25 MG (50000 UT) TABS Take 1 tablet by mouth once a week. 12 tablet 3  . empagliflozin (JARDIANCE) 25 MG TABS tablet Take 25 mg by mouth daily.    . Hypromellose (ARTIFICIAL TEARS OP) Place 2 drops into both eyes 2 (two) times daily as needed (for dry eyes).     09/20/2020 losartan (COZAAR) 100 MG tablet TAKE 1 TABLET BY MOUTH DAILY 90 tablet 3  . metFORMIN (GLUCOPHAGE) 500 MG tablet Take 1,000 mg by mouth daily with breakfast.     . traZODone (DESYREL) 50 MG tablet Take 0.5-1 tablets (25-50 mg total) by mouth at bedtime as needed for sleep. 30 tablet 3   No facility-administered medications prior to visit.     Per HPI unless specifically indicated in ROS section below Review of Systems Objective:  BP 124/82 (BP Location: Left Arm, Patient Position: Sitting, Cuff Size: Large)   Pulse 90   Temp 97.8 F (36.6 C) (Temporal)   Ht 6\' 2"  (1.88 m)   Wt 266 lb 3 oz (120.7 kg)   SpO2 96%   BMI 34.18 kg/m   Wt Readings from Last 3 Encounters:  09/18/20 266 lb 3 oz (120.7 kg)  07/17/20 266 lb 8 oz (120.9 kg)  07/13/19 265 lb 5 oz (  120.3 kg)      Physical Exam Vitals and nursing note reviewed.  Constitutional:      Appearance: Normal appearance. He is not ill-appearing.  Neck:     Thyroid: No thyroid mass or thyromegaly.  Cardiovascular:     Rate and Rhythm: Normal rate and regular rhythm.     Pulses: Normal pulses.     Heart sounds: Normal heart sounds. No murmur heard.   Pulmonary:     Effort: Pulmonary effort is normal. No respiratory distress.     Breath sounds: Normal breath sounds. No wheezing or rhonchi.  Abdominal:     General: Abdomen is flat. Bowel sounds are normal. There is no distension.     Palpations: Abdomen is soft. There is no mass.     Tenderness: There is no  abdominal tenderness. There is no right CVA tenderness, left CVA tenderness, guarding or rebound. Negative signs include Murphy's sign.     Hernia: No hernia is present.  Musculoskeletal:     Right lower leg: No edema.     Left lower leg: No edema.  Neurological:     Mental Status: He is alert.  Psychiatric:        Mood and Affect: Mood normal.        Behavior: Behavior normal.       Results for orders placed or performed in visit on 09/18/20  POCT Urinalysis Dipstick (Automated)  Result Value Ref Range   Color, UA yellow    Clarity, UA clear    Glucose, UA Positive (A) Negative   Bilirubin, UA negative    Ketones, UA negative    Spec Grav, UA 1.025 1.010 - 1.025   Blood, UA negative    pH, UA 5.5 5.0 - 8.0   Protein, UA Negative Negative   Urobilinogen, UA 0.2 0.2 or 1.0 E.U./dL   Nitrite, UA negative    Leukocytes, UA Negative Negative   Assessment & Plan:  This visit occurred during the SARS-CoV-2 public health emergency.  Safety protocols were in place, including screening questions prior to the visit, additional usage of staff PPE, and extensive cleaning of exam room while observing appropriate contact time as indicated for disinfecting solutions.   Problem List Items Addressed This Visit    Left flank pain    Ongoing for >2 yrs. Benign exam today. Update renal US and UA. ?chronic kidney stones although KUB 2018 was normal.      Relevant Orders   POCT Urinalysis Dipstick (Automated) (Completed)   Essential hypertension - Primary    Chronic, improved. Continue current regimen of amlodipine and losartan.       Adjustment disorder with mixed anxiety and depressed mood    High stress at home related to teenagers. He continues seeing grief counselor but may transition to regular counselor.  Wife passed 11/2019 (COVID).           Meds ordered this encounter  Medications  . traZODone (DESYREL) 50 MG tablet    Sig: Take 0.5-1 tablets (25-50 mg total) by mouth at  bedtime as needed for sleep.    Dispense:  90 tablet    Refill:  3   Orders Placed This Encounter  Procedures  . POCT Urinalysis Dipstick (Automated)    Patient Instructions  Urinalysis today  We will order kidney ultrasound for further evaluation of intermittent left flank pain.  Blood pressures are doing better - continue current regimen.  Return for next physical after 07/17/2021   Follow up  plan: Return if symptoms worsen or fail to improve.  Ria Bush, MD

## 2020-09-18 NOTE — Assessment & Plan Note (Signed)
High stress at home related to teenagers. He continues seeing grief counselor but may transition to regular counselor.  Wife passed 11/2019 (COVID).

## 2020-09-18 NOTE — Patient Instructions (Addendum)
Urinalysis today  We will order kidney ultrasound for further evaluation of intermittent left flank pain.  Blood pressures are doing better - continue current regimen.  Return for next physical after 07/17/2021

## 2020-09-18 NOTE — Assessment & Plan Note (Signed)
Ongoing for >2 yrs. Benign exam today. Update renal US and UA. ?chronic kidney stones although KUB 2018 was normal.

## 2020-12-02 ENCOUNTER — Other Ambulatory Visit: Payer: Self-pay | Admitting: Family Medicine

## 2021-03-06 ENCOUNTER — Ambulatory Visit
Admission: EM | Admit: 2021-03-06 | Discharge: 2021-03-06 | Disposition: A | Discharge status: Home / Self Care | Payer: Managed Care, Other (non HMO) | Attending: Emergency Medicine | Admitting: Emergency Medicine

## 2021-03-06 ENCOUNTER — Telehealth (INDEPENDENT_AMBULATORY_CARE_PROVIDER_SITE_OTHER): Payer: Managed Care, Other (non HMO) | Admitting: Family Medicine

## 2021-03-06 ENCOUNTER — Encounter: Payer: Self-pay | Admitting: Family Medicine

## 2021-03-06 VITALS — HR 95

## 2021-03-06 DIAGNOSIS — Z1152 Encounter for screening for COVID-19: Secondary | ICD-10-CM | POA: Diagnosis not present

## 2021-03-06 DIAGNOSIS — I10 Essential (primary) hypertension: Secondary | ICD-10-CM

## 2021-03-06 DIAGNOSIS — J0100 Acute maxillary sinusitis, unspecified: Secondary | ICD-10-CM

## 2021-03-06 DIAGNOSIS — R0600 Dyspnea, unspecified: Secondary | ICD-10-CM

## 2021-03-06 DIAGNOSIS — J989 Respiratory disorder, unspecified: Secondary | ICD-10-CM

## 2021-03-06 DIAGNOSIS — H6691 Otitis media, unspecified, right ear: Secondary | ICD-10-CM

## 2021-03-06 MED ORDER — AZITHROMYCIN 250 MG PO TABS: 250.0000 mg | ORAL_TABLET | Freq: Every day | ORAL | 0 refills | Status: DC

## 2021-03-06 NOTE — Patient Instructions (Signed)
Please seek inperson medical evaluation right away today as we discussed. Thank you.    I hope you are feeling better soon!  It was nice to meet you today. I help [facility] out with telemedicine visits on Tuesdays and Thursdays and am available for visits on those days. If you have any concerns or questions following this visit please schedule a follow up visit with your Primary Care doctor or seek care at a local urgent care clinic to avoid delays in care.

## 2021-03-06 NOTE — Discharge Instructions (Addendum)
Take the Zithromax as directed.  Follow-up with your primary care provider if your symptoms are not improving.    Your COVID and Flu tests pending.  You should self quarantine until the test results are back.  Your blood pressure is elevated today at 147/113.  Please have this rechecked by your primary care provider in 1-2 weeks.

## 2021-03-06 NOTE — ED Provider Notes (Signed)
[name]    CSN: [id number] Arrival date & time: 03/06/21  1315      History   Chief Complaint Chief Complaint  Patient presents with  . Shortness of Breath  . Cough    HPI Kyle [name] is a 45 y.o. male.   Patient presents with 4-day history of sinus pressure, congestion, postnasal drip, nonproductive cough, shortness of breath, dizziness, ringing in his ears.  He believes he has had a fever because he has had chills but his thermometer is not working.  OTC treatment attempted at home.  His medical history includes hypertension, diabetes, sleep apnea.  Patient states he has not taken his blood pressure medication in 4 days because he has not picked it up at the pharmacy yet because he has been at home sick.  The history is provided by the patient and medical records.    Past Medical History:  Diagnosis Date  . COVID-19 11/09/2019  . Diabetes type 2, controlled (HCC)    Dr Tedd Sias  . Dyslipidemia 05/25/2014  . Family history of adverse reaction to anesthesia    Mother has postoperatve N&V  . GERD 05/11/2007  . Gout, unspecified 09/05/2009  . History of kidney stones    h/o kidney stones "regularly over the past 5 years"  . HTN (hypertension) 09/08/2008  . IBS 01/08/2008  . SLEEP APNEA 01/08/2008   cannot tolerate CPAP  . THYROID STIMULATING HORMONE, ABNORMAL 10/10/2008    Patient Active Problem List   Diagnosis Date Noted  . Adjustment disorder with mixed anxiety and depressed mood 07/17/2020  . Left flank pain 01/01/2019  . 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 Vitamin D deficiency 06/30/2018  . Fatigue 04/07/2018  . History of kidney stones 07/01/2017  . 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 Encounter for general adult medical examination with abnormal findings 05/25/2014  . Dyslipidemia associated with type 2 diabetes mellitus (HCC) 05/25/2014  . Diabetes mellitus type 2, uncontrolled, with complications (HCC) 08/27/2013  . Obesity, Class I,  BMI 30.0-34.9 (see actual BMI) 06/02/2013  . Gout 09/05/2009  . Essential hypertension 01/08/2008  . IBS 01/08/2008  . OSA (obstructive sleep apnea) 01/08/2008  . GERD 05/11/2007    Past Surgical History:  Procedure Laterality Date  . COLONOSCOPY    . HERNIA REPAIR    . NASAL SEPTOPLASTY W/ TURBINOPLASTY Bilateral 08/28/2018   Procedure: NASAL SEPTOPLASTY WITH TURBINATE REDUCTION;  Surgeon: [name], MD;  Location: [facility] OR;  Service: ENT;  Laterality: Bilateral;  . UPPER GASTROINTESTINAL ENDOSCOPY         Home Medications    Prior to Admission medications   Medication Sig Start Date End Date Taking? Authorizing Provider  allopurinol (ZYLOPRIM) 300 MG tablet TAKE 1 TABLET(300 MG) BY MOUTH DAILY 07/17/20  Yes Eustaquio Boyden, MD  amLODipine (NORVASC) 10 MG tablet TAKE 1 TABLET(10 MG) BY MOUTH DAILY 07/17/20  Yes Eustaquio Boyden, MD  atorvastatin (LIPITOR) 20 MG tablet TAKE 1 TABLET(20 MG) BY MOUTH DAILY 07/17/20  Yes Eustaquio Boyden, MD  azithromycin (ZITHROMAX) 250 MG tablet Take 1 tablet (250 mg total) by mouth daily. Take first 2 tablets together, then 1 every day until finished. 03/06/21  Yes Mickie Bail, NP  empagliflozin (JARDIANCE) 25 MG TABS tablet Take 25 mg by mouth daily. 01/01/19  Yes Eustaquio Boyden, MD  losartan (COZAAR) 100 MG tablet TAKE 1 TABLET BY MOUTH DAILY 07/17/20  Yes Eustaquio Boyden, MD  metFORMIN (GLUCOPHAGE) 500 MG tablet Take 1,000 mg  by mouth daily with breakfast.    Yes [provider]  traZODone (DESYREL) 50 MG tablet Take 0.5-1 tablets (25-50 mg total) by mouth at bedtime as needed for sleep. 09/18/20  Yes Eustaquio BoydenGutierrez, Javier, MD  Ascorbic Acid (VITAMIN C) 500 MG CAPS Take 1 capsule by mouth daily. Patient taking differently: Take 500 mg by mouth daily. 04/07/18   Eustaquio BoydenGutierrez, Javier, MD  Cholecalciferol (VITAMIN D3) 1.25 MG (50000 UT) TABS Take 1 tablet by mouth once a week. 07/17/20   Eustaquio BoydenGutierrez, Javier, MD  Hypromellose (ARTIFICIAL TEARS OP)  Place 2 drops into both eyes 2 (two) times daily as needed (for dry eyes).     [provider]    Family History Family History  Problem Relation Age of Onset  . Stroke Other        bilateral grandfathers  . Hypertension Other        strong on maternal side  . Cancer Sister [id number]       cervical  . Cancer Father [id number]       prostate s/p radiation seed implant  . CAD Father 6260  . Hypothyroidism Father 1940  . CAD Mother 2460  . CAD Other        bilateral grandfathers  . Cancer Maternal Uncle        colon  . Diabetes Neg Hx     Social History Social History   Tobacco Use  . Smoking status: Former Smoker    Quit date: 10/29/2003    Years since quitting: 17.3  . Smokeless tobacco: Former NeurosurgeonUser    Quit date: 10/29/2003  Vaping Use  . Vaping Use: Never used  Substance Use Topics  . Alcohol use: Yes    Comment: Rarely  . Drug use: No     Allergies   Penicillins   Review of Systems Review of Systems  Constitutional: Negative for chills and fever.  HENT: Positive for congestion, ear pain, postnasal drip and sinus pressure. Negative for sore throat.   Respiratory: Positive for cough and shortness of breath.   Cardiovascular: Negative for chest pain and palpitations.  Gastrointestinal: Negative for abdominal pain, diarrhea and vomiting.  Genitourinary: Negative for dysuria and hematuria.  Musculoskeletal: Negative for arthralgias and back pain.  Skin: Negative for color change and rash.  Neurological: Positive for dizziness. Negative for syncope, weakness and numbness.  All other systems reviewed and are negative.    Physical Exam Triage Vital Signs ED Triage Vitals  Enc Vitals Group     BP 03/06/21 1345 (!) 147/113     Pulse Rate 03/06/21 1345 97     Resp 03/06/21 1345 20     Temp 03/06/21 1345 98.1 F (36.7 C)     Temp Source 03/06/21 1345 Oral     SpO2 03/06/21 1345 97 %     Weight --      Height --      Head Circumference --      Peak Flow --      Pain  Score 03/06/21 1346 0     Pain Loc --      Pain Edu? --      Excl. in GC? --    No data found.  Updated Vital Signs BP (!) 142/100   Pulse 98   Temp 98.1 F (36.7 C) (Oral)   Resp 18   SpO2 95%   Visual Acuity Right Eye Distance:   Left Eye Distance:   Bilateral Distance:    Right  Eye Near:   Left Eye Near:    Bilateral Near:     Physical Exam Vitals and nursing note reviewed.  Constitutional:      General: He is not in acute distress.    Appearance: He is well-developed. He is ill-appearing.  HENT:     Head: Normocephalic and atraumatic.     Right Ear: Tympanic membrane is erythematous.     Left Ear: Tympanic membrane normal.     Nose: Congestion present.     Mouth/Throat:     Mouth: Mucous membranes are moist.     Pharynx: Oropharynx is clear.  Eyes:     Conjunctiva/sclera: Conjunctivae normal.  Cardiovascular:     Rate and Rhythm: Normal rate and regular rhythm.     Heart sounds: Normal heart sounds.  Pulmonary:     Effort: Pulmonary effort is normal. No respiratory distress.     Breath sounds: Normal breath sounds.  Abdominal:     Palpations: Abdomen is soft.     Tenderness: There is no abdominal tenderness.  Musculoskeletal:     Cervical back: Neck supple.  Skin:    General: Skin is warm and dry.  Neurological:     General: No focal deficit present.     Mental Status: He is alert and oriented to person, place, and time.     Gait: Gait normal.  Psychiatric:        Mood and Affect: Mood normal.        Behavior: Behavior normal.      UC Treatments / Results  Labs (all labs ordered are listed, but only abnormal results are displayed) Labs Reviewed  COVID-19, FLU A+B NAA    EKG   Radiology No results found.  Procedures Procedures (including critical care time)  Medications Ordered in UC Medications - No data to display  Initial Impression / Assessment and Plan / UC Course  I have reviewed the triage vital signs and the nursing  notes.  Pertinent labs & imaging results that were available during my care of the patient were reviewed by me and considered in my medical decision making (see chart for details).   Acute sinusitis, right otitis media, elevated blood pressure reading with known hypertension.  Influenza and COVID pending.  Instructed patient to self quarantine until the test results are back.  Treating with Zithromax.  Discussed symptomatic treatment including Tylenol or ibuprofen, rest, hydration.  Instructed patient to follow up with PCP if his symptoms are not improving.  Also discussed with patient that his blood pressure is elevated today and needs to be rechecked by his PCP in 1 to 2 weeks.  He has not taken his blood pressure medications in several days but agrees to go to the pharmacy to pick them up now and take as soon as he gets home.  Patient agrees to plan of care.    Final Clinical Impressions(s) / UC Diagnoses   Final diagnoses:  Encounter for screening for COVID-19  Acute non-recurrent maxillary sinusitis  Right otitis media, unspecified otitis media type  Elevated blood pressure reading in office with diagnosis of hypertension     Discharge Instructions     Take the Zithromax as directed.  Follow-up with your primary care provider if your symptoms are not improving.    Your COVID and Flu tests pending.  You should self quarantine until the test results are back.  Your blood pressure is elevated today at 147/113.  Please have this rechecked by your primary  care provider in 1-2 weeks.         ED Prescriptions    Medication Sig Dispense Auth. Provider   azithromycin (ZITHROMAX) 250 MG tablet Take 1 tablet (250 mg total) by mouth daily. Take first 2 tablets together, then 1 every day until finished. 6 tablet Mickie Bail, NP     PDMP not reviewed this encounter.   Mickie Bail, NP 03/06/21 1426

## 2021-03-06 NOTE — ED Triage Notes (Signed)
Pt states his children were sick and he developed s/s shortly after.  Reports cough, SOB, dizziness with exertion, ringing in ears, nasal drainage in morning, feeling flushed then chilled x 4 days.   (Has been out of BP meds x 4 days)

## 2021-03-06 NOTE — Progress Notes (Signed)
Virtual Visit via Video Note  I connected with Kyle [name]  on 03/06/21 at 12:00 PM EDT by a video enabled telemedicine application and verified that I am speaking with the correct person using two identifiers.  Location patient: home, [place] Location provider:work or home office Persons participating in the virtual visit: patient, provider  I discussed the limitations of evaluation and management by telemedicine and the availability of in person appointments. The patient expressed understanding and agreed to proceed.   HPI:  Acute telemedicine visit for flu like illness: -kids sick recently with similar symptoms -Onset: 3-4 days ago -Symptoms include:nasal congestion, cough, ha, chills at times, SOB - if walks at all, shallow breathing, some CP in the center of the chest today -son and him were both negative on a covid test -Denies: NVD, inability to eat/drink/get out of bed -Pertinent past medical history: DM, HTN, obesity and see below -Pertinent medication allergies: Allergies  Allergen Reactions  . Penicillins Swelling and Other (See Comments)    SWELLING REACTION UNSPECIFIED  Has patient had a PCN reaction causing immediate rash, facial/tongue/throat swelling, SOB or lightheadedness with hypotension: No Has patient had a PCN reaction causing severe rash involving mucus membranes or skin necrosis: No Has patient had a PCN reaction that required hospitalization: No Has patient had a PCN reaction occurring within the last 10 years: No If all of the above answers are "NO", then may proceed with Cephalosporin use.     -COVID-19 vaccine status: reports has had his vaccine - 2 doses in epic  ROS: See pertinent positives and negatives per HPI.  Past Medical History:  Diagnosis Date  . COVID-19 11/09/2019  . Diabetes type 2, controlled (HCC)    Dr Tedd Sias  . Dyslipidemia 05/25/2014  . Family history of adverse reaction to anesthesia    Mother has postoperatve N&V  . GERD 05/11/2007  .  Gout, unspecified 09/05/2009  . History of kidney stones    h/o kidney stones "regularly over the past 5 years"  . HTN (hypertension) 09/08/2008  . IBS 01/08/2008  . SLEEP APNEA 01/08/2008   cannot tolerate CPAP  . THYROID STIMULATING HORMONE, ABNORMAL 10/10/2008    Past Surgical History:  Procedure Laterality Date  . COLONOSCOPY    . HERNIA REPAIR    . NASAL SEPTOPLASTY W/ TURBINOPLASTY Bilateral 08/28/2018   Procedure: NASAL SEPTOPLASTY WITH TURBINATE REDUCTION;  Surgeon: [name], MD;  Location: [facility] OR;  Service: ENT;  Laterality: Bilateral;  . UPPER GASTROINTESTINAL ENDOSCOPY       Current Outpatient Medications:  .  allopurinol (ZYLOPRIM) 300 MG tablet, TAKE 1 TABLET(300 MG) BY MOUTH DAILY, Disp: 90 tablet, Rfl: 3 .  amLODipine (NORVASC) 10 MG tablet, TAKE 1 TABLET(10 MG) BY MOUTH DAILY, Disp: 90 tablet, Rfl: 3 .  Ascorbic Acid (VITAMIN C) 500 MG CAPS, Take 1 capsule by mouth daily. (Patient taking differently: Take 500 mg by mouth daily. ), Disp: 60 capsule, Rfl:  .  atorvastatin (LIPITOR) 20 MG tablet, TAKE 1 TABLET(20 MG) BY MOUTH DAILY, Disp: 90 tablet, Rfl: 3 .  Cholecalciferol (VITAMIN D3) 1.25 MG (50000 UT) TABS, Take 1 tablet by mouth once a week., Disp: 12 tablet, Rfl: 3 .  empagliflozin (JARDIANCE) 25 MG TABS tablet, Take 25 mg by mouth daily., Disp: , Rfl:  .  Hypromellose (ARTIFICIAL TEARS OP), Place 2 drops into both eyes 2 (two) times daily as needed (for dry eyes). , Disp: , Rfl:  .  losartan (COZAAR) 100 MG tablet, TAKE 1 TABLET  BY MOUTH DAILY, Disp: 90 tablet, Rfl: 3 .  metFORMIN (GLUCOPHAGE) 500 MG tablet, Take 1,000 mg by mouth daily with breakfast. , Disp: , Rfl:  .  traZODone (DESYREL) 50 MG tablet, Take 0.5-1 tablets (25-50 mg total) by mouth at bedtime as needed for sleep., Disp: 90 tablet, Rfl: 3  EXAM:  VITALS per patient if applicable:  GENERAL: alert, oriented, appears well and in no acute distress  HEENT: atraumatic, conjunttiva clear, no obvious  abnormalities on inspection of external nose and ears  NECK: normal movements of the head and neck  LUNGS: on inspection no signs of respiratory distress, breathing rate appears normal, no obvious gross SOB, gasping or wheezing  CV: no obvious cyanosis  MS: moves all visible extremities without noticeable abnormality  PSYCH/NEURO: pleasant and cooperative, no obvious depression or anxiety, speech and thought processing grossly intact  ASSESSMENT AND PLAN:  Discussed the following assessment and plan:  Respiratory illness  Dyspnea, unspecified type  -we discussed possible serious and likely etiologies, options for evaluation and workup, limitations of telemedicine visit vs in person visit, treatment, treatment risks and precautions. Pt prefers to treat via telemedicine empirically rather than in person at this moment. Given the Dypnea and CP advised inperson evaluation at a higher level of care. He opted to seek inperson care via private vehicle at the [facility] as PCP office is not available. Agrees to do so right away today.  . Discussed options for inperson care if PCP office not available. Did let this patient know that I only do telemedicine on Tuesdays and Thursdays for [place]. Advised to schedule follow up visit with PCP or [facility] if any further questions or concerns to avoid delays in care.   I discussed the assessment and treatment plan with the patient. The patient was provided an opportunity to ask questions and all were answered. The patient agreed with the plan and demonstrated an understanding of the instructions.     Terressa Koyanagi, DO

## 2021-03-07 LAB — COVID-19, FLU A+B NAA
Influenza A, NAA: NOT DETECTED
Influenza B, NAA: NOT DETECTED
SARS-CoV-2, NAA: NOT DETECTED

## 2021-07-13 ENCOUNTER — Other Ambulatory Visit: Payer: Self-pay | Admitting: Family Medicine

## 2021-07-24 ENCOUNTER — Telehealth: Payer: Self-pay | Admitting: *Deleted

## 2021-07-24 ENCOUNTER — Encounter: Payer: Self-pay | Admitting: Family Medicine

## 2021-07-24 ENCOUNTER — Telehealth (INDEPENDENT_AMBULATORY_CARE_PROVIDER_SITE_OTHER): Payer: Managed Care, Other (non HMO) | Admitting: Family Medicine

## 2021-07-24 VITALS — Wt 250.0 lb

## 2021-07-24 DIAGNOSIS — J0140 Acute pansinusitis, unspecified: Secondary | ICD-10-CM

## 2021-07-24 DIAGNOSIS — Z20822 Contact with and (suspected) exposure to covid-19: Secondary | ICD-10-CM

## 2021-07-24 MED ORDER — DOXYCYCLINE HYCLATE 100 MG PO TABS: 100.0000 mg | ORAL_TABLET | Freq: Two times a day (BID) | ORAL | 0 refills | Status: AC

## 2021-07-24 NOTE — Telephone Encounter (Signed)
See note from today

## 2021-07-24 NOTE — Progress Notes (Signed)
I connected with Boneta Lucks on 07/24/21 at  2:20 PM EDT by video and verified that I am speaking with the correct person using two identifiers.   I discussed the limitations, risks, security and privacy concerns of performing an evaluation and management service by video and the availability of in person appointments. I also discussed with the patient that there may be a patient responsible charge related to this service. The patient expressed understanding and agreed to proceed.  Patient location: out of town Provider Location: [facility] Participants: Lynnda Child and RYE DECOSTE   Subjective:     Kyle [name] is a 45 y.o. male presenting for Nasal Congestion (Yellow, thick mucous /Negative covid test on sunday), Sinusitis (Sinuses sensitive to touch ), and Chills     Sinusitis This is a new problem. The current episode started yesterday. Maximum temperature: no thermometer. Associated symptoms include chills, congestion, diaphoresis, headaches and sinus pressure. Pertinent negatives include no coughing, shortness of breath or sore throat. Past treatments include oral decongestants (neti pot). The treatment provided moderate relief.   Took negative covid on 9/21, 9/25   Review of Systems  Constitutional:  Positive for chills and diaphoresis.  HENT:  Positive for congestion, sinus pressure and sinus pain. Negative for sore throat.   Respiratory:  Negative for cough and shortness of breath.   Neurological:  Positive for headaches.    Social History   Tobacco Use  Smoking Status Former   Types: Cigarettes   Quit date: 10/29/2003   Years since quitting: 17.7  Smokeless Tobacco Former   Quit date: 10/29/2003        Objective:   BP Readings from Last 3 Encounters:  03/06/21 (!) 142/100  09/18/20 124/82  07/17/20 (!) 162/104   Wt Readings from Last 3 Encounters:  07/24/21 250 lb (113.4 kg)  09/18/20 266 lb 3 oz (120.7 kg)  07/17/20 266 lb 8 oz  (120.9 kg)   Wt 250 lb (113.4 kg)   BMI 32.10 kg/m   Physical Exam Constitutional:      Appearance: Normal appearance. He is not ill-appearing.  HENT:     Head: Normocephalic and atraumatic.     Right Ear: External ear normal.     Left Ear: External ear normal.  Eyes:     Conjunctiva/sclera: Conjunctivae normal.  Pulmonary:     Effort: Pulmonary effort is normal. No respiratory distress.  Neurological:     Mental Status: He is alert. Mental status is at baseline.  Psychiatric:        Mood and Affect: Mood normal.        Behavior: Behavior normal.        Thought Content: Thought content normal.        Judgment: Judgment normal.            Assessment & Plan:   Problem List Items Addressed This Visit   None Visit Diagnoses     Acute non-recurrent pansinusitis    -  Primary   Relevant Medications   doxycycline (VIBRA-TABS) 100 MG tablet   Suspected COVID-19 virus infection          Emphasized that his timing of covid tests was prior to current symptoms and with what sounds like fever/chills (sweats/chills) this is concerning for flu or covid.   Unfortunately out of town and unable to test. He will try to find a rapid test.   Time course - sick last week, with improvement  and now worsening - does seem consistent with sinus infection so abx sent if covid is negative.   At this point - discussed he is high risk and candidate for antivirals but he notes he is not interested in treatment.   Discussed 5 day timeline for starting and advised updating if he changes his mind. Last labs >1 year so would likely do molunipavir.   Return if symptoms worsen or fail to improve.  Lynnda Child, MD

## 2021-07-24 NOTE — Telephone Encounter (Signed)
PLEASE NOTE: All timestamps contained within this report are represented as [place] Standard Time. CONFIDENTIALTY NOTICE: This fax transmission is intended only for the addressee. It contains information that is legally privileged, confidential or otherwise protected from use or disclosure. If you are not the intended recipient, you are strictly prohibited from reviewing, disclosing, copying using or disseminating any of this information or taking any action in reliance on or regarding this information. If you have received this fax in error, please notify us immediately by telephone so that we can arrange for its return to [place]. Phone: [phone], Toll-Free: [phone], Fax: [phone] Page: 1 of 2 Call Id: [id number] [facility] Day - Client TELEPHONE ADVICE RECORD AccessNurse Patient Name: Kyle [name] [facility] Gender: Male DOB: 04/27/1976 Age: 45 Y 5 M 15 D Return Phone Number: [phone] (Primary) Address: [place]/ [place]/ [place]  [address] Client [facility] - Client Client Site [facility] - Day Physician Eustaquio Boyden - MD Contact Type Call Who Is Calling Patient / Member / Family / Caregiver Call Type Triage / Clinical Relationship To Patient Self Return Phone Number [phone] (Primary) Chief Complaint Facial Pain Reason for Call Symptomatic / Request for Health Information Initial Comment Caller states he has sweats, congestion, facial pain, and nasal mucus. Translation No Nurse Assessment Nurse: Gerre Pebbles, RN, Casimiro Needle Date/Time Lamount Cohen Time): 07/24/2021 1:14:51 PM Confirm and document reason for call. If symptomatic, describe symptoms. ---Caller states he has sweats, congestion, facial pain, and nasal mucus (white and creamy). Thinks it is sinus related. Had cold last week and was negative for COVID. Using netti pot and OTC not helping. Can't sleep at night. Unsure if having fever  but having chills and sweats. Does the patient have any new or worsening symptoms? ---Yes Will a triage be completed? ---Yes Related visit to physician within the last 2 weeks? ---No Does the PT have any chronic conditions? (i.e. diabetes, asthma, this includes High risk factors for pregnancy, etc.) ---Yes List chronic conditions. ---Dm2, HTN Is this a behavioral health or substance abuse call? ---No Guidelines Guideline Title Affirmed Question Affirmed Notes Nurse Date/Time Lamount Cohen Time) Sinus Pain or Congestion [1] Redness or swelling on the cheek, forehead or around the eye AND [2] fever Gust Brooms 07/24/2021 1:16:24 PM Disp. Time Lamount Cohen Time) Disposition Final User PLEASE NOTE: All timestamps contained within this report are represented as [place] Standard Time. CONFIDENTIALTY NOTICE: This fax transmission is intended only for the addressee. It contains information that is legally privileged, confidential or otherwise protected from use or disclosure. If you are not the intended recipient, you are strictly prohibited from reviewing, disclosing, copying using or disseminating any of this information or taking any action in reliance on or regarding this information. If you have received this fax in error, please notify us immediately by telephone so that we can arrange for its return to [place]. Phone: [phone], Toll-Free: [phone], Fax: [phone] Page: 2 of 2 Call Id: [id number] 07/24/2021 1:23:23 PM Go to ED Now (or PCP triage) Yes Gerre Pebbles, RN, Dawayne Cirri Disagree/Comply Comply Caller Understands Yes PreDisposition Call Doctor Care Advice Given Per Guideline CARE ADVICE given per Sinus Pain or Congestion (Adult) guideline. GO TO ED NOW (OR PCP TRIAGE): Comments User: Phillips Grout, RN Date/Time Lamount Cohen Time): 07/24/2021 1:24:36 PM Caller stated he called to get virtual visit and was sent to [place]. Unable to reach back line for appointment.  Suggested she reach out to other office location or  could be seen in [facility]. Informed caller that office may be back from lunch after 1:30 and could try then. Referrals REFERRED TO PCP OFFICE

## 2021-07-24 NOTE — Telephone Encounter (Signed)
Spoke to patient by telephone and was advised that he talked with access nurse and they told him to go to the ER. Patient stated that he is out of town and that would be impossible. Patient stated that he is sure that he has a sinus infection. Patient scheduled for a virtual visit today 07/24/21 with Dr. Selena Batten for a sinus infection.

## 2021-07-30 ENCOUNTER — Other Ambulatory Visit: Payer: Self-pay | Admitting: Family Medicine

## 2021-08-14 ENCOUNTER — Other Ambulatory Visit: Payer: Self-pay

## 2021-08-14 ENCOUNTER — Encounter: Payer: Self-pay | Admitting: Internal Medicine

## 2021-08-14 ENCOUNTER — Encounter: Payer: Self-pay | Admitting: Family Medicine

## 2021-08-14 ENCOUNTER — Ambulatory Visit (INDEPENDENT_AMBULATORY_CARE_PROVIDER_SITE_OTHER): Payer: Managed Care, Other (non HMO) | Admitting: Family Medicine

## 2021-08-14 ENCOUNTER — Ambulatory Visit: Payer: Managed Care, Other (non HMO) | Admitting: Internal Medicine

## 2021-08-14 VITALS — BP 138/94 | HR 95 | Temp 97.6°F | Ht 74.0 in | Wt 250.4 lb

## 2021-08-14 VITALS — BP 152/100 | HR 88 | Ht 74.0 in | Wt 247.8 lb

## 2021-08-14 DIAGNOSIS — Z1211 Encounter for screening for malignant neoplasm of colon: Secondary | ICD-10-CM

## 2021-08-14 DIAGNOSIS — E785 Hyperlipidemia, unspecified: Secondary | ICD-10-CM | POA: Diagnosis not present

## 2021-08-14 DIAGNOSIS — E669 Obesity, unspecified: Secondary | ICD-10-CM

## 2021-08-14 DIAGNOSIS — R072 Precordial pain: Secondary | ICD-10-CM

## 2021-08-14 DIAGNOSIS — I10 Essential (primary) hypertension: Secondary | ICD-10-CM

## 2021-08-14 DIAGNOSIS — Z23 Encounter for immunization: Secondary | ICD-10-CM

## 2021-08-14 DIAGNOSIS — Z0001 Encounter for general adult medical examination with abnormal findings: Secondary | ICD-10-CM

## 2021-08-14 DIAGNOSIS — Z0000 Encounter for general adult medical examination without abnormal findings: Secondary | ICD-10-CM | POA: Diagnosis not present

## 2021-08-14 DIAGNOSIS — M1A9XX0 Chronic gout, unspecified, without tophus (tophi): Secondary | ICD-10-CM | POA: Diagnosis not present

## 2021-08-14 DIAGNOSIS — G4733 Obstructive sleep apnea (adult) (pediatric): Secondary | ICD-10-CM | POA: Diagnosis not present

## 2021-08-14 DIAGNOSIS — E782 Mixed hyperlipidemia: Secondary | ICD-10-CM

## 2021-08-14 DIAGNOSIS — R5383 Other fatigue: Secondary | ICD-10-CM

## 2021-08-14 DIAGNOSIS — E1169 Type 2 diabetes mellitus with other specified complication: Secondary | ICD-10-CM

## 2021-08-14 DIAGNOSIS — E559 Vitamin D deficiency, unspecified: Secondary | ICD-10-CM | POA: Diagnosis not present

## 2021-08-14 DIAGNOSIS — R0789 Other chest pain: Secondary | ICD-10-CM

## 2021-08-14 DIAGNOSIS — Z125 Encounter for screening for malignant neoplasm of prostate: Secondary | ICD-10-CM

## 2021-08-14 DIAGNOSIS — K219 Gastro-esophageal reflux disease without esophagitis: Secondary | ICD-10-CM

## 2021-08-14 DIAGNOSIS — F4323 Adjustment disorder with mixed anxiety and depressed mood: Secondary | ICD-10-CM

## 2021-08-14 LAB — COMPREHENSIVE METABOLIC PANEL
ALT: 54 U/L — ABNORMAL HIGH (ref 0–53)
AST: 28 U/L (ref 0–37)
Albumin: 4.7 g/dL (ref 3.5–5.2)
Alkaline Phosphatase: 53 U/L (ref 39–117)
BUN: 11 mg/dL (ref 6–23)
CO2: 29 mEq/L (ref 19–32)
Calcium: 9.4 mg/dL (ref 8.4–10.5)
Chloride: 96 mEq/L (ref 96–112)
Creatinine, Ser: 0.63 mg/dL (ref 0.40–1.50)
GFR: 114.87 mL/min (ref >60.00)
Glucose, Bld: 322 mg/dL — ABNORMAL HIGH (ref 70–99)
Potassium: 4.3 mEq/L (ref 3.5–5.1)
Sodium: 133 mEq/L — ABNORMAL LOW (ref 135–145)
Total Bilirubin: 0.8 mg/dL (ref 0.2–1.2)
Total Protein: 7.2 g/dL (ref 6.0–8.3)

## 2021-08-14 LAB — LIPID PANEL
Cholesterol: 145 mg/dL (ref 0–200)
HDL: 29.2 mg/dL — ABNORMAL LOW (ref >39.00)
Total CHOL/HDL Ratio: 5
Triglycerides: 576 mg/dL — ABNORMAL HIGH (ref 0.0–149.0)

## 2021-08-14 LAB — POCT GLYCOSYLATED HEMOGLOBIN (HGB A1C): Hemoglobin A1C: 11.5 % — AB (ref 4.0–5.6)

## 2021-08-14 LAB — HEMOGLOBIN A1C: Hgb A1c MFr Bld: 11.2 % — ABNORMAL HIGH (ref 4.6–6.5)

## 2021-08-14 LAB — VITAMIN D 25 HYDROXY (VIT D DEFICIENCY, FRACTURES): VITD: 17.05 ng/mL — ABNORMAL LOW (ref 30.00–100.00)

## 2021-08-14 LAB — TSH: TSH: 2.76 u[IU]/mL (ref 0.35–5.50)

## 2021-08-14 LAB — LDL CHOLESTEROL, DIRECT: Direct LDL: 44 mg/dL

## 2021-08-14 LAB — URIC ACID: Uric Acid, Serum: 4.2 mg/dL (ref 4.0–7.8)

## 2021-08-14 LAB — TROPONIN I (HIGH SENSITIVITY): High Sens Troponin I: <2 ng/L (ref 2–17)

## 2021-08-14 LAB — PSA: PSA: 0.36 ng/mL (ref 0.10–4.00)

## 2021-08-14 MED ORDER — METFORMIN HCL 500 MG PO TABS: 2000.0000 mg | ORAL_TABLET | Freq: Every day | ORAL | 6 refills | Status: DC

## 2021-08-14 MED ORDER — METFORMIN HCL 500 MG PO TABS: 1000.0000 mg | ORAL_TABLET | Freq: Two times a day (BID) | ORAL | 6 refills | Status: DC

## 2021-08-14 MED ORDER — EMPAGLIFLOZIN 25 MG PO TABS: 25.0000 mg | ORAL_TABLET | Freq: Every day | ORAL | 6 refills | Status: DC

## 2021-08-14 MED ORDER — ATORVASTATIN CALCIUM 80 MG PO TABS: 80.0000 mg | ORAL_TABLET | Freq: Every day | ORAL | 1 refills | Status: DC

## 2021-08-14 MED ORDER — NITROGLYCERIN 0.4 MG SL SUBL: 0.4000 mg | SUBLINGUAL_TABLET | SUBLINGUAL | 1 refills | Status: DC | PRN

## 2021-08-14 MED ORDER — METOPROLOL TARTRATE 100 MG PO TABS: 100.0000 mg | ORAL_TABLET | Freq: Once | ORAL | 0 refills | Status: DC

## 2021-08-14 MED ORDER — ASPIRIN 81 MG PO TBEC: 81.0000 mg | DELAYED_RELEASE_TABLET | Freq: Every day | ORAL | Status: DC

## 2021-08-14 NOTE — Assessment & Plan Note (Addendum)
Chronic, uncontrolled. Has run out of jardiance and is only taking metformin 2000mg  once daily.  No recent endo visits - requests referral to establish with more local endo in [place].  Update A1c, jardiance refilled, refer to more local endo.

## 2021-08-14 NOTE — Assessment & Plan Note (Signed)
Chronic, mildly elevated today despite amlodipine 10mg  and losartan 100mg  daily. No changes made. See below.

## 2021-08-14 NOTE — Assessment & Plan Note (Addendum)
Preventative protocols reviewed and updated unless pt declined. Discussed healthy diet and lifestyle.  See below re: chest discomfort

## 2021-08-14 NOTE — Assessment & Plan Note (Signed)
Chronic on atorvastatin 20mg  daily - will increase to 80mg  daily while we await cards eval.  The 10-year ASCVD risk score (Arnett DK, et al., 2019) is: 5.7%   Values used to calculate the score:     Age: 45 years     Sex: Male     Is Non-Hispanic African American: No     Diabetic: Yes     Tobacco smoker: No     Systolic Blood Pressure: 138 mmHg     Is BP treated: Yes     HDL Cholesterol: 27 mg/dL     Total Cholesterol: 135 mg/dL

## 2021-08-14 NOTE — Assessment & Plan Note (Addendum)
S/p septoplasty/turbinate reduction. Not using dental device Feels well rested when he wakes up in the mornings.

## 2021-08-14 NOTE — Assessment & Plan Note (Signed)
Continue allopurinol 300mg  daily, check urate.

## 2021-08-14 NOTE — Assessment & Plan Note (Signed)
Continues seeing counselor.

## 2021-08-14 NOTE — Progress Notes (Signed)
OFFICE CONSULT NOTE  Chief Complaint:  Chest pain  Primary Care Physician: Eustaquio Boyden, MD  HPI:  Kyle [name] is a 45 y.o. male who is being seen today for the evaluation of chest pain at the request of Eustaquio Boyden, MD. Kyle [name] is seen today for an acute visit for chest pain.  He was seen earlier this morning by Dr. Sharen Hones.  He reports has been having some escalating pressure in his chest.  He sounds like a heaviness in the center of his chest that radiates somewhat upper to the left upper chest.  He reports some soreness in the chest wall as well.  He has multiple cardiovascular risk factors including type 2 diabetes however unfortunately not controlled as his A1c today was 11.5, which is quite elevated.  He was previously on Jardiance and metformin but had run out of the [place].  This may be also responsible for his chest discomfort.  Her labs were obtained today as well as a troponin.  Those are pending.  He also has dyslipidemia previously on 40 mg atorvastatin which was increased to 80 mg and aspirin 81 mg daily was added today.  EKG performed in their office shows normal sinus rhythm without ischemia.  We repeated that today which also shows no ischemic changes.  His symptoms are present at rest and sometimes more likely with situational stress rather than exertion.  The history is significant for heart disease in his mother who had an MI and hypertension and vascular disease also on both sides of his family.  PMHx:  Past Medical History:  Diagnosis Date   COVID-19 11/09/2019   Diabetes type 2, controlled (HCC)    Dr Tedd Sias   Dyslipidemia 05/25/2014   Family history of adverse reaction to anesthesia    Mother has postoperatve N&V   GERD 05/11/2007   Gout, unspecified 09/05/2009   History of kidney stones    h/o kidney stones "regularly over the past 5 years"   HTN (hypertension) 09/08/2008   IBS 01/08/2008   SLEEP APNEA 01/08/2008   cannot tolerate CPAP   THYROID  STIMULATING HORMONE, ABNORMAL 10/10/2008    Past Surgical History:  Procedure Laterality Date   COLONOSCOPY     HERNIA REPAIR     NASAL SEPTOPLASTY W/ TURBINOPLASTY Bilateral 08/28/2018   Procedure: NASAL SEPTOPLASTY WITH TURBINATE REDUCTION;  Surgeon: [name], MD;  Location: [facility] OR;  Service: ENT;  Laterality: Bilateral;   UPPER GASTROINTESTINAL ENDOSCOPY      FAMHx:  Family History  Problem Relation Age of Onset   Stroke Other        bilateral grandfathers   Hypertension Other        strong on maternal side   Cancer Sister 27       cervical   Cancer Father 47       prostate s/p radiation seed implant   CAD Father 45   Hypothyroidism Father 45   CAD Mother 45   CAD Other        bilateral grandfathers   Cancer Maternal Uncle        colon   Diabetes Neg Hx     SOCHx:   reports that he quit smoking about 17 years ago. His smoking use included cigarettes. He quit smokeless tobacco use about 17 years ago. He reports current alcohol use. He reports that he does not use drugs.  ALLERGIES:  Allergies  Allergen Reactions   Penicillins Swelling and Other (See Comments)  SWELLING REACTION UNSPECIFIED  Has patient had a PCN reaction causing immediate rash, facial/tongue/throat swelling, SOB or lightheadedness with hypotension: No Has patient had a PCN reaction causing severe rash involving mucus membranes or skin necrosis: No Has patient had a PCN reaction that required hospitalization: No Has patient had a PCN reaction occurring within the last 10 years: No If all of the above answers are "NO", then may proceed with Cephalosporin use.     ROS: Pertinent items noted in HPI and remainder of comprehensive ROS otherwise negative.  HOME MEDS: Current Outpatient Medications on File Prior to Visit  Medication Sig Dispense Refill   allopurinol (ZYLOPRIM) 300 MG tablet TAKE 1 TABLET(300 MG) BY MOUTH DAILY 90 tablet 0   amLODipine (NORVASC) 10 MG tablet TAKE 1 TABLET(10 MG) BY  MOUTH DAILY 90 tablet 0   Ascorbic Acid (VITAMIN C) 500 MG CAPS Take 1 capsule by mouth daily. 60 capsule    aspirin 81 MG EC tablet Take 1 tablet (81 mg total) by mouth daily. Swallow whole.     atorvastatin (LIPITOR) 80 MG tablet Take 1 tablet (80 mg total) by mouth daily. 90 tablet 1   Cholecalciferol (VITAMIN D3) 1.25 MG (50000 UT) TABS Take 1 tablet by mouth once a week. 12 tablet 3   empagliflozin (JARDIANCE) 25 MG TABS tablet Take 1 tablet (25 mg total) by mouth daily. 30 tablet 6   Hypromellose (ARTIFICIAL TEARS OP) Place 2 drops into both eyes 2 (two) times daily as needed (for dry eyes).      losartan (COZAAR) 100 MG tablet TAKE 1 TABLET BY MOUTH DAILY 90 tablet 3   metFORMIN (GLUCOPHAGE) 500 MG tablet Take 2 tablets (1,000 mg total) by mouth 2 (two) times daily with a meal. 120 tablet 6   nitroGLYCERIN (NITROSTAT) 0.4 MG SL tablet Place 1 tablet (0.4 mg total) under the tongue every 5 (five) minutes as needed for chest pain (if used, seek emergent medical care). 50 tablet 1   traZODone (DESYREL) 50 MG tablet Take 0.5-1 tablets (25-50 mg total) by mouth at bedtime as needed for sleep. 90 tablet 3   No current facility-administered medications on file prior to visit.    LABS/IMAGING: Results for orders placed or performed in visit on 08/14/21 (from the past 48 hour(s))  POCT glycosylated hemoglobin (Hb A1C)     Status: Abnormal   Collection Time: 08/14/21 10:39 AM  Result Value Ref Range   Hemoglobin A1C 11.5 (A) 4.0 - 5.6 %   HbA1c POC (<> result, manual entry)     HbA1c, POC (prediabetic range)     HbA1c, POC (controlled diabetic range)     No results found.  LIPID PANEL:    Component Value Date/Time   CHOL 135 07/13/2020 0821   TRIG (H) 07/13/2020 0821    636.0 Triglyceride is over 400; calculations on Lipids are invalid.   HDL 27.00 (L) 07/13/2020 0821   CHOLHDL 5 07/13/2020 0821   VLDL 59.2 (H) 12/29/2018 0737   LDLCALC 85 07/09/2019 1453   LDLDIRECT 42.0 07/13/2020  0821    WEIGHTS: Wt Readings from Last 3 Encounters:  08/14/21 247 lb 12.8 oz (112.4 kg)  08/14/21 250 lb 7 oz (113.6 kg)  07/24/21 250 lb (113.4 kg)    VITALS: BP (!) 152/100   Pulse 88   Ht 6\' 2"  (1.88 m)   Wt 247 lb 12.8 oz (112.4 kg)   SpO2 96%   BMI 31.82 kg/m   EXAM: General appearance:  alert and no distress Neck: no carotid bruit, no JVD, and thyroid not enlarged, symmetric, no tenderness/mass/nodules Lungs: clear to auscultation bilaterally Heart: regular rate and rhythm, S1, S2 normal, no murmur, click, rub or gallop Abdomen: soft, non-tender; bowel sounds normal; no masses,  no organomegaly Extremities: extremities normal, atraumatic, no cyanosis or edema Pulses: 2+ and symmetric Skin: Skin color, texture, turgor normal. No rashes or lesions Neurologic: Grossly normal Psych: Pleasant  EKG: Normal sinus rhythm at 88- personally reviewed  ASSESSMENT: Chest pain, possibly angina Uncontrolled type 2 diabetes-A1c 11.5 today Hypertension Dyslipidemia Family history of coronary disease  PLAN: 1.   Mr. Wigington has chest pain which could possibly be angina.  He has multiple risk factors and poorly controlled diabetes.  Apparently had run out of one of his medicines and this is being addressed by his primary care provider.  I am interested in seeing his labs as his chest pain may be simply due to hyperglycemia.  In addition though, would recommend an ischemic evaluation.  As his symptoms have been subacute and are not necessarily provoked by exertion or relieved by rest with a normal EKG, I feel that he does not need urgent coronary evaluation with catheterization rather we could consider alternative options such as CT coronary angiography.  He is agreeable to that.  He will likely need 100 mg metoprolol prior to the procedure.  Will await his lab work to make sure is not in any degree of renal insufficiency related to his uncontrolled diabetes.  Plan follow-up with me  afterwards.  If abnormal of course then will need to follow-up with cardiac catheterization.  Agree with the increase in his atorvastatin and addition of low-dose aspirin.  He has been given nitroglycerin with instructions to use it if necessary.  He understands also to present to the emergency department if he develops chest pain that is unrelenting.  Thanks again for the kind referral.  Chrystie Nose, MD, [facility]  [facility]  Medical Director of the [facility]  [facility] Diplomate of the American Board of Clinical Lipidology Attending Cardiologist  Direct Dial: [phone]  Fax: [phone]  Website:  [email or web address] Nicely Pasty Manninen 08/14/2021, 1:14 PM

## 2021-08-14 NOTE — Assessment & Plan Note (Signed)
Continues working towards weight oss.

## 2021-08-14 NOTE — Patient Instructions (Addendum)
Flu shot today  Labs today  Increase atorvastatin to 80mg  daily.  We will refer you to heart doctor urgently for evaluation. In the meantime start aspirin 81mg  daily and may use nitroglycerin under the tongue if any recurrent chest tightness. If you do use it, seek urgent evaluation. Call 911 if chest pain returns or worsens.  We will work on endocrinology referral in [place].   Health Maintenance, Male Adopting a healthy lifestyle and getting preventive care are important in promoting health and wellness. Ask your health care provider about: The right schedule for you to have regular tests and exams. Things you can do on your own to prevent diseases and keep yourself healthy. What should I know about diet, weight, and exercise? Eat a healthy diet  Eat a diet that includes plenty of vegetables, fruits, low-fat dairy products, and lean protein. Do not eat a lot of foods that are high in solid fats, added sugars, or sodium. Maintain a healthy weight Body mass index (BMI) is a measurement that can be used to identify possible weight problems. It estimates body fat based on height and weight. Your health care provider can help determine your BMI and help you achieve or maintain a healthy weight. Get regular exercise Get regular exercise. This is one of the most important things you can do for your health. Most adults should: Exercise for at least 150 minutes each week. The exercise should increase your heart rate and make you sweat (moderate-intensity exercise). Do strengthening exercises at least twice a week. This is in addition to the moderate-intensity exercise. Spend less time sitting. Even light physical activity can be beneficial. Watch cholesterol and blood lipids Have your blood tested for lipids and cholesterol at 45 years of age, then have this test every 5 years. You may need to have your cholesterol levels checked more often if: Your lipid or cholesterol levels are high. You are  older than 45 years of age. You are at high risk for heart disease. What should I know about cancer screening? Many types of cancers can be detected early and may often be prevented. Depending on your health history and family history, you may need to have cancer screening at various ages. This may include screening for: Colorectal cancer. Prostate cancer. Skin cancer. Lung cancer. What should I know about heart disease, diabetes, and high blood pressure? Blood pressure and heart disease High blood pressure causes heart disease and increases the risk of stroke. This is more likely to develop in people who have high blood pressure readings, are of African descent, or are overweight. Talk with your health care provider about your target blood pressure readings. Have your blood pressure checked: Every 3-5 years if you are 49-40 years of age. Every year if you are 39 years old or older. If you are between the ages of 83 and 3 and are a current or former smoker, ask your health care provider if you should have a one-time screening for abdominal aortic aneurysm (AAA). Diabetes Have regular diabetes screenings. This checks your fasting blood sugar level. Have the screening done: Once every three years after age 97 if you are at a normal weight and have a low risk for diabetes. More often and at a younger age if you are overweight or have a high risk for diabetes. What should I know about preventing infection? Hepatitis B If you have a higher risk for hepatitis B, you should be screened for this virus. Talk with your health care  provider to find out if you are at risk for hepatitis B infection. Hepatitis C Blood testing is recommended for: Everyone born from [birth year] through 1965. Anyone with known risk factors for hepatitis C. Sexually transmitted infections (STIs) You should be screened each year for STIs, including gonorrhea and chlamydia, if: You are sexually active and are younger than 45  years of age. You are older than 45 years of age and your health care provider tells you that you are at risk for this type of infection. Your sexual activity has changed since you were last screened, and you are at increased risk for chlamydia or gonorrhea. Ask your health care provider if you are at risk. Ask your health care provider about whether you are at high risk for HIV. Your health care provider may recommend a prescription medicine to help prevent HIV infection. If you choose to take medicine to prevent HIV, you should first get tested for HIV. You should then be tested every 3 months for as long as you are taking the medicine. Follow these instructions at home: Lifestyle Do not use any products that contain nicotine or tobacco, such as cigarettes, e-cigarettes, and chewing tobacco. If you need help quitting, ask your health care provider. Do not use street drugs. Do not share needles. Ask your health care provider for help if you need support or information about quitting drugs. Alcohol use Do not drink alcohol if your health care provider tells you not to drink. If you drink alcohol: Limit how much you have to 0-2 drinks a day. Be aware of how much alcohol is in your drink. In the [place] one drink equals one 12 oz bottle of beer (355 mL), one 5 oz glass of wine (148 mL), or one 1 oz glass of hard liquor (44 mL). General instructions Schedule regular health, dental, and eye exams. Stay current with your vaccines. Tell your health care provider if: You often feel depressed. You have ever been abused or do not feel safe at home. Summary Adopting a healthy lifestyle and getting preventive care are important in promoting health and wellness. Follow your health care provider's instructions about healthy diet, exercising, and getting tested or screened for diseases. Follow your health care provider's instructions on monitoring your cholesterol and blood pressure. This information is  not intended to replace advice given to you by your health care provider. Make sure you discuss any questions you have with your health care provider. Document Revised: 12/22/2020 Document Reviewed: 10/07/2018 Elsevier Patient Education  2022 [employer].

## 2021-08-14 NOTE — Assessment & Plan Note (Addendum)
I don't see acute findings on EKG.  Concerning description of anginal chest pain.  Not currently having any chest tightness.  Start aspirin 81mg  daily, increase statin to full dose, add SL nitro PRN with indications to seek care if used. Reviewed in detail symptoms to necessitate ER/911 evaluation.  Will check stat TnI and expedite cardiology referral.

## 2021-08-14 NOTE — Assessment & Plan Note (Signed)
Stable period recently without need for daily PPI

## 2021-08-14 NOTE — Assessment & Plan Note (Signed)
Update levels on weekly replacement.  

## 2021-08-14 NOTE — Progress Notes (Signed)
Patient ID: Kyle [name], male    DOB: 1976/03/06, 45 y.o.   MRN: [id number]  This visit was conducted in person.  BP (!) 138/94 (BP Location: Right Arm, Cuff Size: Large)   Pulse 95   Temp 97.6 F (36.4 C) (Temporal)   Ht 6\' 2"  (1.88 m)   Wt 250 lb 7 oz (113.6 kg)   SpO2 96%   BMI 32.15 kg/m    CC: CPE Subjective:   HPI: Kyle [name] is a 45 y.o. male presenting on 08/14/2021 for Annual Exam (C/o chest tightness for some mos. )   Several months of substernal chest tightness initially present during stressful situations now over the past 1-2 months becoming more consistent, associated with fatigue and soreness to left lateral upper chest. Some left jaw pain last month which he attributed to sinus inflammation - that has since resolved. Resting helps. Denies dyspnea, nausea, L arm pain. Fmhx CAD (mother with MI at age 65s, father with CBG age 87). Known uncontrolled diabetic.   DM - followed by endo planning to switch endo to [place] due to convenience. Has been off jardiance for a few , taking metformin 2000mg  daily.   Preventative:   Colon cancer screening - colonoscopy 2007. Will re refer in [place].  Prostate cancer in father age 87 - father passed away from this (mets)  Flu shot yearly Td 2009, Tdap 2019 Pneumovax 2015.  COVID vaccine - Pfizer 01/2020, 02/2020, no boosters Seat belt use discussed  Sunscreen use discussed. No changing moles.  Non smoker  Alcohol - 3-4 glasses of wine/wk Dentist Q6 mo - had root canal this year  Eye exam yearly - due  Caffeine: 4 cups coffee/day Widower. Wife passed from COVID 11/2019. Lives with 2 children (adopted from [place])  Occupation: Production designer, theatre/television/film at [employer]	[place] Activity: no regular exercise  Diet: good water daily, fruits/vegetables some      Relevant past medical, surgical, family and social history reviewed and updated as indicated. Interim medical history since our last visit reviewed. Allergies and medications  reviewed and updated. Outpatient Medications Prior to Visit  Medication Sig Dispense Refill   allopurinol (ZYLOPRIM) 300 MG tablet TAKE 1 TABLET(300 MG) BY MOUTH DAILY 90 tablet 0   amLODipine (NORVASC) 10 MG tablet TAKE 1 TABLET(10 MG) BY MOUTH DAILY 90 tablet 0   Hypromellose (ARTIFICIAL TEARS OP) Place 2 drops into both eyes 2 (two) times daily as needed (for dry eyes).      losartan (COZAAR) 100 MG tablet TAKE 1 TABLET BY MOUTH DAILY 90 tablet 3   traZODone (DESYREL) 50 MG tablet Take 0.5-1 tablets (25-50 mg total) by mouth at bedtime as needed for sleep. 90 tablet 3   atorvastatin (LIPITOR) 20 MG tablet TAKE 1 TABLET(20 MG) BY MOUTH DAILY 90 tablet 0   empagliflozin (JARDIANCE) 25 MG TABS tablet Take 25 mg by mouth daily.     metFORMIN (GLUCOPHAGE) 500 MG tablet Take 1,000 mg by mouth daily with breakfast.      Ascorbic Acid (VITAMIN C) 500 MG CAPS Take 1 capsule by mouth daily. (Patient not taking: Reported on 08/14/2021) 60 capsule    Cholecalciferol (VITAMIN D3) 1.25 MG (50000 UT) TABS Take 1 tablet by mouth once a week. (Patient not taking: Reported on 08/14/2021) 12 tablet 3   No facility-administered medications prior to visit.     Per HPI unless specifically indicated in ROS section below Review of Systems  Constitutional:  Negative for activity  change, appetite change, chills, fatigue, fever and unexpected weight change.  HENT:  Negative for hearing loss.   Eyes:  Negative for visual disturbance.  Respiratory:  Positive for cough (mild occasional) and chest tightness. Negative for shortness of breath and wheezing.   Cardiovascular:  Positive for chest pain. Negative for palpitations and leg swelling.       Occasional GERD  Gastrointestinal:  Negative for abdominal distention, abdominal pain, blood in stool, constipation, diarrhea, nausea and vomiting.  Genitourinary:  Negative for difficulty urinating and hematuria.  Musculoskeletal:  Negative for arthralgias, myalgias and  neck pain.  Skin:  Negative for rash.  Neurological:  Negative for dizziness, seizures, syncope and headaches.  Hematological:  Negative for adenopathy. Does not bruise/bleed easily.  Psychiatric/Behavioral:  Positive for dysphoric mood. The patient is nervous/anxious.        Sees couselor   Objective:  BP (!) 138/94 (BP Location: Right Arm, Cuff Size: Large)   Pulse 95   Temp 97.6 F (36.4 C) (Temporal)   Ht 6\' 2"  (1.88 m)   Wt 250 lb 7 oz (113.6 kg)   SpO2 96%   BMI 32.15 kg/m   Wt Readings from Last 3 Encounters:  08/14/21 250 lb 7 oz (113.6 kg)  07/24/21 250 lb (113.4 kg)  09/18/20 266 lb 3 oz (120.7 kg)      Physical Exam Vitals and nursing note reviewed.  Constitutional:      General: He is not in acute distress.    Appearance: Normal appearance. He is well-developed. He is obese. He is not ill-appearing.  HENT:     Head: Normocephalic and atraumatic.     Right Ear: Hearing, tympanic membrane, ear canal and external ear normal.     Left Ear: Hearing, tympanic membrane, ear canal and external ear normal.  Eyes:     General: No scleral icterus.    Extraocular Movements: Extraocular movements intact.     Conjunctiva/sclera: Conjunctivae normal.     Pupils: Pupils are equal, round, and reactive to light.  Neck:     Thyroid: No thyroid mass or thyromegaly.  Cardiovascular:     Rate and Rhythm: Normal rate and regular rhythm.     Pulses: Normal pulses.          Radial pulses are 2+ on the right side and 2+ on the left side.     Heart sounds: Normal heart sounds. No murmur heard. Pulmonary:     Effort: Pulmonary effort is normal. No respiratory distress.     Breath sounds: Normal breath sounds. No stridor. No wheezing, rhonchi or rales.     Comments: No reproducible midline chest tightness although there is soreness to left lateral upper chest wall Chest:     Chest wall: Tenderness present.  Abdominal:     General: Bowel sounds are normal. There is no distension.      Palpations: Abdomen is soft. There is no mass.     Tenderness: There is no abdominal tenderness. There is no guarding or rebound.     Hernia: No hernia is present.  Musculoskeletal:        General: Normal range of motion.     Cervical back: Normal range of motion and neck supple.     Right lower leg: No edema.     Left lower leg: No edema.  Lymphadenopathy:     Cervical: No cervical adenopathy.  Skin:    General: Skin is warm and dry.     Findings: No  rash.  Neurological:     General: No focal deficit present.     Mental Status: He is alert and oriented to person, place, and time.  Psychiatric:        Mood and Affect: Mood normal.        Behavior: Behavior normal.        Thought Content: Thought content normal.        Judgment: Judgment normal.      Results for orders placed or performed in visit on 08/14/21  POCT glycosylated hemoglobin (Hb A1C)  Result Value Ref Range   Hemoglobin A1C 11.5 (A) 4.0 - 5.6 %   HbA1c POC (<> result, manual entry)     HbA1c, POC (prediabetic range)     HbA1c, POC (controlled diabetic range)     EKG - NSR rate 80s, mild LAD, normal intervals, no hypertrophy or acute ST/T changes.   Assessment & Plan:  This visit occurred during the SARS-CoV-2 public health emergency.  Safety protocols were in place, including screening questions prior to the visit, additional usage of staff PPE, and extensive cleaning of exam room while observing appropriate contact time as indicated for disinfecting solutions.   Problem List Items Addressed This Visit     Encounter for general adult medical examination with abnormal findings - Primary (Chronic)    Preventative protocols reviewed and updated unless pt declined. Discussed healthy diet and lifestyle.  See below re: chest discomfort      Gout    Continue allopurinol 300mg  daily, check urate.       Relevant Orders   Uric acid   Essential hypertension    Chronic, mildly elevated today despite amlodipine  10mg  and losartan 100mg  daily. No changes made. See below.       Relevant Medications   nitroGLYCERIN (NITROSTAT) 0.4 MG SL tablet   aspirin 81 MG EC tablet   atorvastatin (LIPITOR) 80 MG tablet   GERD    Stable period recently without need for daily PPI      OSA (obstructive sleep apnea)    S/p septoplasty/turbinate reduction. Not using dental device Feels well rested when he wakes up in the mornings.       Obesity, Class I, BMI 30.0-34.9 (see actual BMI)    Continues working towards weight oss.       Type 2 diabetes mellitus with other specified complication (HCC)    Chronic, uncontrolled. Has run out of jardiance and is only taking metformin 2000mg  once daily.  No recent endo visits - requests referral to establish with more local endo in [place].  Update A1c, jardiance refilled, refer to more local endo.       Relevant Medications   empagliflozin (JARDIANCE) 25 MG TABS tablet   metFORMIN (GLUCOPHAGE) 500 MG tablet   aspirin 81 MG EC tablet   atorvastatin (LIPITOR) 80 MG tablet   Other Relevant Orders   Hemoglobin A1c   Ambulatory referral to Cardiology   POCT glycosylated hemoglobin (Hb A1C) (Completed)   Ambulatory referral to Endocrinology   Dyslipidemia associated with type 2 diabetes mellitus (HCC)    Chronic on atorvastatin 20mg  daily - will increase to 80mg  daily while we await cards eval.  The 10-year ASCVD risk score (Arnett DK, et al., 2019) is: 5.7%   Values used to calculate the score:     Age: 59 years     Sex: Male     Is Non-Hispanic African American: No     Diabetic: Yes  Tobacco smoker: No     Systolic Blood Pressure: 138 mmHg     Is BP treated: Yes     HDL Cholesterol: 27 mg/dL     Total Cholesterol: 135 mg/dL       Relevant Medications   empagliflozin (JARDIANCE) 25 MG TABS tablet   metFORMIN (GLUCOPHAGE) 500 MG tablet   aspirin 81 MG EC tablet   atorvastatin (LIPITOR) 80 MG tablet   Other Relevant Orders   Lipid panel   Comprehensive  metabolic panel   Fatigue   Vitamin D deficiency    Update levels on weekly replacement      Relevant Orders   VITAMIN D 25 Hydroxy (Vit-D Deficiency, Fractures)   Chest tightness    I don't see acute findings on EKG.  Concerning description of anginal chest pain.  Not currently having any chest tightness.  Start aspirin 81mg  daily, increase statin to full dose, add SL nitro PRN with indications to seek care if used. Reviewed in detail symptoms to necessitate ER/911 evaluation.  Will check stat TnI and expedite cardiology referral.       Relevant Orders   EKG 12-Lead (Completed)   Ambulatory referral to Cardiology   Troponin I   Adjustment disorder with mixed anxiety and depressed mood    Continues seeing counselor.       Other Visit Diagnoses     Special screening for malignant neoplasm of prostate       Relevant Orders   PSA   Need for influenza vaccination       Relevant Orders   Flu Vaccine QUAD 66mo+IM (Fluarix, Fluzone & Alfiuria Quad PF) (Completed)   Special screening for malignant neoplasms, colon       Relevant Orders   Ambulatory referral to Gastroenterology        Meds ordered this encounter  Medications   empagliflozin (JARDIANCE) 25 MG TABS tablet    Sig: Take 1 tablet (25 mg total) by mouth daily.    Dispense:  30 tablet    Refill:  6   metFORMIN (GLUCOPHAGE) 500 MG tablet    Sig: Take 4 tablets (2,000 mg total) by mouth daily with breakfast.    Dispense:  120 tablet    Refill:  6   nitroGLYCERIN (NITROSTAT) 0.4 MG SL tablet    Sig: Place 1 tablet (0.4 mg total) under the tongue every 5 (five) minutes as needed for chest pain (if used, seek emergent medical care).    Dispense:  50 tablet    Refill:  1   aspirin 81 MG EC tablet    Sig: Take 1 tablet (81 mg total) by mouth daily. Swallow whole.   atorvastatin (LIPITOR) 80 MG tablet    Sig: Take 1 tablet (80 mg total) by mouth daily.    Dispense:  90 tablet    Refill:  1    Note new dose    Orders Placed This Encounter  Procedures   Flu Vaccine QUAD 57mo+IM (Fluarix, Fluzone & Alfiuria Quad PF)   Lipid panel   Hemoglobin A1c   VITAMIN D 25 Hydroxy (Vit-D Deficiency, Fractures)   Uric acid   Comprehensive metabolic panel   PSA   Troponin I   Ambulatory referral to Cardiology    Referral Priority:   Urgent    Referral Type:   Consultation    Referral Reason:   Specialty Services Required    Requested Specialty:   Cardiology    Number of Visits Requested:  1   Ambulatory referral to Endocrinology    Referral Priority:   Routine    Referral Type:   Consultation    Referral Reason:   Specialty Services Required    Number of Visits Requested:   1   Ambulatory referral to Gastroenterology    Referral Priority:   Routine    Referral Type:   Consultation    Referral Reason:   Specialty Services Required    Number of Visits Requested:   1   POCT glycosylated hemoglobin (Hb A1C)   EKG 12-Lead    Patient instructions: Flu shot today  Labs today  Increase atorvastatin to 80mg  daily.  We will refer you to heart doctor urgently for evaluation. In the meantime start aspirin 81mg  daily and may use nitroglycerin under the tongue if any recurrent chest tightness. If you do use it, seek urgent evaluation. Call 911 if chest pain returns or worsens.  We will work on endocrinology referral in [place].   Follow up plan: Return in about 4 months (around 12/15/2021), or if symptoms worsen or fail to improve, for follow up visit.  [place], MD

## 2021-08-14 NOTE — Patient Instructions (Addendum)
Medication Instructions:  Your physician recommends that you continue on your current medications as directed. Please refer to the Current Medication list given to you today.  *If you need a refill on your cardiac medications before your next appointment, please call your pharmacy*  Testing/Procedures: Cardiac CT at [facility]   Follow-Up: At [facility], you and your health needs are our priority.  As part of our continuing mission to provide you with exceptional heart care, we have created designated Provider Care Teams.  These Care Teams include your primary Cardiologist (physician) and Advanced Practice Providers (APPs -  Physician Assistants and Nurse Practitioners) who all work together to provide you with the care you need, when you need it.  We recommend signing up for the patient portal called "MyChart".  Sign up information is provided on this After Visit Summary.  MyChart is used to connect with patients for Virtual Visits (Telemedicine).  Patients are able to view lab/test results, encounter notes, upcoming appointments, etc.  Non-urgent messages can be sent to your provider as well.   To learn more about what you can do with MyChart, go to [email or web address].    Your next appointment:   4-6 week(s)  The format for your next appointment:   In Person  Provider:   Kirtland Bouchard [place] Hilty, MD  OK to double book or use DOD appointment   Other Instructions   Your cardiac CT will be scheduled at one of the below locations:   [facility] [address] [place], [place] [address] [phone]  OR  [facility] [address] [place], [place] [address] [phone]  If scheduled at [facility], please arrive at the [facility] main entrance (entrance A) of [facility] 30 minutes prior to test start time. You can use the FREE valet parking offered at the main entrance (encouraged to control the  heart rate for the test) Proceed to the [facility] Radiology Department (first floor) to check-in and test prep.  If scheduled at [facility], please arrive 15 mins early for check-in and test prep.  Please follow these instructions carefully (unless otherwise directed):  Hold all erectile dysfunction medications at least 3 days (72 hrs) prior to test.  On the Night Before the Test: Be sure to Drink plenty of water. Do not consume any caffeinated/decaffeinated beverages or chocolate 12 hours prior to your test. Do not take any antihistamines 12 hours prior to your test.  On the Day of the Test: Drink plenty of water until 1 hour prior to the test. Do not eat any food 4 hours prior to the test. You may take your regular medications prior to the test.  Take metoprolol (Lopressor) two hours prior to test. HOLD Furosemide/Hydrochlorothiazide morning of the test.  After the Test: Drink plenty of water. After receiving IV contrast, you may experience a mild flushed feeling. This is normal. On occasion, you may experience a mild rash up to 24 hours after the test. This is not dangerous. If this occurs, you can take Benadryl 25 mg and increase your fluid intake. If you experience trouble breathing, this can be serious. If it is severe call 911 IMMEDIATELY. If it is mild, please call our office. If you take any of these medications: Glipizide/Metformin, Avandament, Glucavance, please do not take 48 hours after completing test unless otherwise instructed.  Please allow 2-4 weeks for scheduling of routine cardiac CTs. Some insurance companies require a  pre-authorization which may delay scheduling of this test.   For non-scheduling related questions, please contact the cardiac imaging nurse navigator should you have any questions/concerns: Rockwell Alexandria, Cardiac Imaging Nurse Navigator Larey Brick, Cardiac Imaging Nurse Navigator [facility] Direct Office Dial: [phone]   For scheduling needs, including cancellations and rescheduling, please call [place], [phone].

## 2021-08-14 NOTE — Addendum Note (Signed)
Addended by: Alvina Chou on: 08/14/2021 11:19 AM   Modules accepted: Orders

## 2021-08-24 ENCOUNTER — Telehealth (HOSPITAL_COMMUNITY): Payer: Self-pay | Admitting: *Deleted

## 2021-08-24 NOTE — Telephone Encounter (Signed)
Attempted to call patient regarding upcoming cardiac CT appointment. °Left message on voicemail with name and callback number ° °Mithran Strike RN Navigator Cardiac Imaging °[facility] °[phone] Office °[phone] Cell ° °

## 2021-08-27 ENCOUNTER — Ambulatory Visit (HOSPITAL_COMMUNITY)
Admission: RE | Admit: 2021-08-27 | Discharge: 2021-08-27 | Disposition: A | Discharge status: Home / Self Care | Payer: Managed Care, Other (non HMO) | Source: Ambulatory Visit | Attending: Internal Medicine | Admitting: Internal Medicine

## 2021-08-27 ENCOUNTER — Other Ambulatory Visit: Payer: Self-pay

## 2021-08-27 DIAGNOSIS — I10 Essential (primary) hypertension: Secondary | ICD-10-CM | POA: Diagnosis present

## 2021-08-27 DIAGNOSIS — R072 Precordial pain: Secondary | ICD-10-CM

## 2021-08-27 DIAGNOSIS — E1169 Type 2 diabetes mellitus with other specified complication: Secondary | ICD-10-CM | POA: Insufficient documentation

## 2021-08-27 MED ORDER — NITROGLYCERIN 0.4 MG SL SUBL
0.8000 mg | SUBLINGUAL_TABLET | Freq: Once | SUBLINGUAL | Status: AC
  Administered 2021-08-27: 0.8 mg via SUBLINGUAL

## 2021-08-27 MED ORDER — IOHEXOL 350 MG/ML SOLN
95.0000 mL | Freq: Once | INTRAVENOUS | Status: AC | PRN
  Administered 2021-08-27: 95 mL via INTRAVENOUS

## 2021-08-27 MED ORDER — NITROGLYCERIN 0.4 MG SL SUBL
SUBLINGUAL_TABLET | SUBLINGUAL | Status: AC
  Filled 2021-08-27: qty 2

## 2021-09-24 ENCOUNTER — Other Ambulatory Visit: Payer: Self-pay

## 2021-09-24 ENCOUNTER — Encounter: Payer: Self-pay | Admitting: Internal Medicine

## 2021-09-24 ENCOUNTER — Ambulatory Visit: Payer: Managed Care, Other (non HMO) | Admitting: Internal Medicine

## 2021-09-24 VITALS — BP 130/90 | Ht 74.0 in | Wt 251.8 lb

## 2021-09-24 DIAGNOSIS — E782 Mixed hyperlipidemia: Secondary | ICD-10-CM

## 2021-09-24 DIAGNOSIS — E1169 Type 2 diabetes mellitus with other specified complication: Secondary | ICD-10-CM | POA: Diagnosis not present

## 2021-09-24 DIAGNOSIS — R072 Precordial pain: Secondary | ICD-10-CM

## 2021-09-24 DIAGNOSIS — I10 Essential (primary) hypertension: Secondary | ICD-10-CM | POA: Diagnosis not present

## 2021-09-24 NOTE — Patient Instructions (Signed)
Medication Instructions:  Your physician recommends that you continue on your current medications as directed. Please refer to the Current Medication list given to you today.  *If you need a refill on your cardiac medications before your next appointment, please call your pharmacy*   Follow-Up: At [facility], you and your health needs are our priority.  As part of our continuing mission to provide you with exceptional heart care, we have created designated Provider Care Teams.  These Care Teams include your primary Cardiologist (physician) and Advanced Practice Providers (APPs -  Physician Assistants and Nurse Practitioners) who all work together to provide you with the care you need, when you need it.  We recommend signing up for the patient portal called "MyChart".  Sign up information is provided on this After Visit Summary.  MyChart is used to connect with patients for Virtual Visits (Telemedicine).  Patients are able to view lab/test results, encounter notes, upcoming appointments, etc.  Non-urgent messages can be sent to your provider as well.   To learn more about what you can do with MyChart, go to [email or web address].    Your next appointment:   We will see you on an as needed basis.  Provider:   Kenneth C. Hilty, MD 

## 2021-09-24 NOTE — Progress Notes (Signed)
OFFICE CONSULT NOTE  Chief Complaint:  Follow-up chest pain  Primary Care Physician: Eustaquio Boyden, MD  HPI:  Kyle [name] is a 45 y.o. male who is being seen today for the evaluation of chest pain at the request of Eustaquio Boyden, MD. [name] is seen today for an acute visit for chest pain.  He was seen earlier this morning by Dr. Sharen Hones.  He reports has been having some escalating pressure in his chest.  He sounds like a heaviness in the center of his chest that radiates somewhat upper to the left upper chest.  He reports some soreness in the chest wall as well.  He has multiple cardiovascular risk factors including type 2 diabetes however unfortunately not controlled as his A1c today was 11.5, which is quite elevated.  He was previously on Jardiance and metformin but had run out of the Melbeta.  This may be also responsible for his chest discomfort.  Her labs were obtained today as well as a troponin.  Those are pending.  He also has dyslipidemia previously on 40 mg atorvastatin which was increased to 80 mg and aspirin 81 mg daily was added today.  EKG performed in their office shows normal sinus rhythm without ischemia.  We repeated that today which also shows no ischemic changes.  His symptoms are present at rest and sometimes more likely with situational stress rather than exertion.  The history is significant for heart disease in his mother who had an MI and hypertension and vascular disease also on both sides of his family.  09/24/2021  Mr. [name] returns today for follow-up of chest pain.  He notes that he still has some mild discomfort in a central area of the left chest.  He says it feels deep and it fails to change with exertion or be relieved by rest.  His coronary CT angiogram was reassuring showing a low calcium score of 2.1 which was 77th percentile, still suggesting age advanced coronary disease.  Otherwise there was mild nonobstructive disease.  He was found to have  a calcified right middle lobe pulmonary nodule which is presumed benign.  No follow-up was necessary.  There is some hepatic steatosis.  Given these risk factors for heart disease he will need continued aggressive medical therapy.  PMHx:  Past Medical History:  Diagnosis Date   COVID-19 11/09/2019   Diabetes type 2, controlled (HCC)    Dr Tedd Sias   Dyslipidemia 05/25/2014   Family history of adverse reaction to anesthesia    Mother has postoperatve N&V   GERD 05/11/2007   Gout, unspecified 09/05/2009   History of kidney stones    h/o kidney stones "regularly over the past 5 years"   HTN (hypertension) 09/08/2008   IBS 01/08/2008   SLEEP APNEA 01/08/2008   cannot tolerate CPAP   THYROID STIMULATING HORMONE, ABNORMAL 10/10/2008    Past Surgical History:  Procedure Laterality Date   COLONOSCOPY     HERNIA REPAIR     NASAL SEPTOPLASTY W/ TURBINOPLASTY Bilateral 08/28/2018   Procedure: NASAL SEPTOPLASTY WITH TURBINATE REDUCTION;  Surgeon: [name], MD;  Location: [facility] OR;  Service: ENT;  Laterality: Bilateral;   UPPER GASTROINTESTINAL ENDOSCOPY      FAMHx:  Family History  Problem Relation Age of Onset   Stroke Other        bilateral grandfathers   Hypertension Other        strong on maternal side   Cancer Sister 38  cervical   Cancer Father 45       prostate s/p radiation seed implant   CAD Father 56   Hypothyroidism Father 74   CAD Mother 64   CAD Other        bilateral grandfathers   Cancer Maternal Uncle        colon   Diabetes Neg Hx     SOCHx:   reports that he quit smoking about 17 years ago. His smoking use included cigarettes. He quit smokeless tobacco use about 17 years ago. He reports current alcohol use. He reports that he does not use drugs.  ALLERGIES:  Allergies  Allergen Reactions   Penicillins Swelling and Other (See Comments)    SWELLING REACTION UNSPECIFIED  Has patient had a PCN reaction causing immediate rash, facial/tongue/throat swelling, SOB  or lightheadedness with hypotension: No Has patient had a PCN reaction causing severe rash involving mucus membranes or skin necrosis: No Has patient had a PCN reaction that required hospitalization: No Has patient had a PCN reaction occurring within the last 10 years: No If all of the above answers are "NO", then may proceed with Cephalosporin use.     ROS: Pertinent items noted in HPI and remainder of comprehensive ROS otherwise negative.  HOME MEDS: Current Outpatient Medications on File Prior to Visit  Medication Sig Dispense Refill   allopurinol (ZYLOPRIM) 300 MG tablet TAKE 1 TABLET(300 MG) BY MOUTH DAILY 90 tablet 0   amLODipine (NORVASC) 10 MG tablet TAKE 1 TABLET(10 MG) BY MOUTH DAILY 90 tablet 0   Ascorbic Acid (VITAMIN C) 500 MG CAPS Take 1 capsule by mouth daily. 60 capsule    aspirin 81 MG EC tablet Take 1 tablet (81 mg total) by mouth daily. Swallow whole.     atorvastatin (LIPITOR) 80 MG tablet Take 1 tablet (80 mg total) by mouth daily. 90 tablet 1   Cholecalciferol (VITAMIN D3) 1.25 MG (50000 UT) TABS Take 1 tablet by mouth once a week. 12 tablet 3   empagliflozin (JARDIANCE) 25 MG TABS tablet Take 1 tablet (25 mg total) by mouth daily. 30 tablet 6   Hypromellose (ARTIFICIAL TEARS OP) Place 2 drops into both eyes 2 (two) times daily as needed (for dry eyes).      losartan (COZAAR) 100 MG tablet TAKE 1 TABLET BY MOUTH DAILY 90 tablet 3   metFORMIN (GLUCOPHAGE) 500 MG tablet Take 2 tablets (1,000 mg total) by mouth 2 (two) times daily with a meal. 120 tablet 6   nitroGLYCERIN (NITROSTAT) 0.4 MG SL tablet Place 1 tablet (0.4 mg total) under the tongue every 5 (five) minutes as needed for chest pain (if used, seek emergent medical care). 50 tablet 1   traZODone (DESYREL) 50 MG tablet Take 0.5-1 tablets (25-50 mg total) by mouth at bedtime as needed for sleep. 90 tablet 3   metoprolol tartrate (LOPRESSOR) 100 MG tablet Take 1 tablet (100 mg total) by mouth once for 1 dose. Take  2 hours prior to CT test 1 tablet 0   No current facility-administered medications on file prior to visit.    LABS/IMAGING: No results found for this or any previous visit (from the past 48 hour(s)).  No results found.  LIPID PANEL:    Component Value Date/Time   CHOL 145 08/14/2021 1107   TRIG (H) 08/14/2021 1107    576.0 Triglyceride is over 400; calculations on Lipids are invalid.   HDL 29.20 (L) 08/14/2021 1107   CHOLHDL 5 08/14/2021 1107  VLDL 59.2 (H) 12/29/2018 0737   LDLCALC 85 07/09/2019 1453   LDLDIRECT 44.0 08/14/2021 1107    WEIGHTS: Wt Readings from Last 3 Encounters:  09/24/21 251 lb 12.8 oz (114.2 kg)  08/14/21 247 lb 12.8 oz (112.4 kg)  08/14/21 250 lb 7 oz (113.6 kg)    VITALS: BP 130/90   Ht 6\' 2"  (1.88 m)   Wt 251 lb 12.8 oz (114.2 kg)   SpO2 96%   BMI 32.33 kg/m   EXAM: Deferred  EKG: Deferred  ASSESSMENT: Chest pain, possibly angina -mild nonobstructive coronary disease by CT coronary angiography, CAC score 2.1 (77th percentile)-07/2021 Uncontrolled type 2 diabetes-A1c 11.5 today Hypertension Dyslipidemia Family history of coronary disease Hepatic steatosis  PLAN: 1.   Mr. Virnig likely has noncardiac chest pain.  His CT showed nonobstructive coronary disease which was very mild although given his age in the 85s his calcium score of 2.1 does put him at a higher percentile.  He needs continued aggressive medical therapy including much improvement in his A1c, good hypertension and metabolic control as well as low LDL cholesterol below 70.  He does have some hepatic steatosis.  More exercise, including high intensity interval training and weight loss will be helpful for this.  Plan follow-up with me as needed.  56s, MD, [facility], FACP    [facility]  Medical Director of the Advanced Lipid Disorders &  Cardiovascular Risk Reduction Clinic Diplomate of the American Board of Clinical Lipidology Attending Cardiologist   Direct Dial: [phone]  Fax: [phone]  Website:  [email or web address] 09/24/2021, 9:46 AM

## 2021-09-26 ENCOUNTER — Other Ambulatory Visit: Payer: Self-pay

## 2021-09-26 MED ORDER — TRAZODONE HCL 50 MG PO TABS: 25.0000 mg | ORAL_TABLET | Freq: Every evening | ORAL | 3 refills | Status: DC | PRN

## 2021-09-26 NOTE — Telephone Encounter (Signed)
E-scribed refill 

## 2021-10-22 ENCOUNTER — Encounter: Payer: Self-pay | Admitting: Family Medicine

## 2021-10-22 DIAGNOSIS — I2510 Atherosclerotic heart disease of native coronary artery without angina pectoris: Secondary | ICD-10-CM | POA: Insufficient documentation

## 2021-11-05 ENCOUNTER — Other Ambulatory Visit: Payer: Self-pay

## 2021-11-05 MED ORDER — LOSARTAN POTASSIUM 100 MG PO TABS: ORAL_TABLET | ORAL | 3 refills | Status: DC

## 2021-11-16 ENCOUNTER — Encounter: Payer: Self-pay | Admitting: Family Medicine

## 2021-11-16 ENCOUNTER — Ambulatory Visit (INDEPENDENT_AMBULATORY_CARE_PROVIDER_SITE_OTHER): Payer: Managed Care, Other (non HMO) | Admitting: Family Medicine

## 2021-11-16 ENCOUNTER — Other Ambulatory Visit: Payer: Self-pay

## 2021-11-16 VITALS — BP 120/76 | HR 100 | Temp 97.3°F | Ht 74.0 in | Wt 256.1 lb

## 2021-11-16 DIAGNOSIS — E1169 Type 2 diabetes mellitus with other specified complication: Secondary | ICD-10-CM | POA: Diagnosis not present

## 2021-11-16 DIAGNOSIS — E785 Hyperlipidemia, unspecified: Secondary | ICD-10-CM | POA: Diagnosis not present

## 2021-11-16 DIAGNOSIS — I2510 Atherosclerotic heart disease of native coronary artery without angina pectoris: Secondary | ICD-10-CM

## 2021-11-16 LAB — POCT GLYCOSYLATED HEMOGLOBIN (HGB A1C): Hemoglobin A1C: 8.6 % — AB (ref 4.0–5.6)

## 2021-11-16 NOTE — Progress Notes (Signed)
Patient ID: Kyle [name], male    DOB: 07-27-1976, 46 y.o.   MRN: [id number]  This visit was conducted in person.  BP 120/76    Pulse 100    Temp (!) 97.3 F (36.3 C) (Temporal)    Ht 6\' 2"  (1.88 m)    Wt 256 lb 1 oz (116.1 kg)    SpO2 96%    BMI 32.88 kg/m    CC: 3 mo f/u visit  Subjective:   HPI: Kyle [name] is a 46 y.o. male presenting on 11/16/2021 for Follow-up (Here for 3 mo f/u.)   Not fasting today.   Since last seen, referred to cardiology for concerning chest discomfort he was experiencing. Underwent reassuring coronary CT - mild nonobstructive CAD with low calcium score of 2.1. rec aggressive medical management.   At that time he was also [place] [facility] - this has not yet been scheduled. Previously followed [place] endo Dr Ted[place]um [place].   DM - does occasionally check sugars - last week was last check, 150 fasting. Compliant with antihyperglycemic regimen which includes: metformin 100mg  bid, jardiance 25mg  daily. Wants to avoid injectables if able. Notes GI trouble on full dose metformin. Denies low sugars or hypoglycemic symptoms. Denies paresthesias, blurry vision. Last diabetic eye exam 06/2017 - DUE [facility]). Glucometer brand: one touch. Last foot exam: 2019 - DUE. DSME: [facility]. Avoids sweetened beverages, juices, watches portions of carbs.  Lab Results  Component Value Date   HGBA1C 8.6 (A) 11/16/2021   Diabetic Foot Exam - Simple   Simple Foot Form Diabetic Foot exam was performed with the following findings: Yes 11/16/2021 10:18 AM  Visual Inspection No deformities, no ulcerations, no other skin breakdown bilaterally: Yes Sensation Testing Intact to touch and monofilament testing bilaterally: Yes Pulse Check Posterior Tibialis and Dorsalis pulse intact bilaterally: Yes Comments    Lab Results  Component Value Date   MICROALBUR 2.9 (H) 06/02/2015        Relevant past medical, surgical, family and  social history reviewed and updated as indicated. Interim medical history since our last visit reviewed. Allergies and medications reviewed and updated. Outpatient Medications Prior to Visit  Medication Sig Dispense Refill   allopurinol (ZYLOPRIM) 300 MG tablet TAKE 1 TABLET(300 MG) BY MOUTH DAILY 90 tablet 0   amLODipine (NORVASC) 10 MG tablet TAKE 1 TABLET(10 MG) BY MOUTH DAILY 90 tablet 0   atorvastatin (LIPITOR) 80 MG tablet Take 1 tablet (80 mg total) by mouth daily. 90 tablet 1   empagliflozin (JARDIANCE) 25 MG TABS tablet Take 1 tablet (25 mg total) by mouth daily. 30 tablet 6   losartan (COZAAR) 100 MG tablet TAKE 1 TABLET BY MOUTH DAILY 90 tablet 3   metFORMIN (GLUCOPHAGE) 500 MG tablet Take 2 tablets (1,000 mg total) by mouth 2 (two) times daily with a meal. 120 tablet 6   traZODone (DESYREL) 50 MG tablet Take 0.5-1 tablets (25-50 mg total) by mouth at bedtime as needed for sleep. 90 tablet 3   nitroGLYCERIN (NITROSTAT) 0.4 MG SL tablet Place 1 tablet (0.4 mg total) under the tongue every 5 (five) minutes as needed for chest pain (if used, seek emergent medical care). (Patient not taking: Reported on 11/16/2021) 50 tablet 1   Ascorbic Acid (VITAMIN C) 500 MG CAPS Take 1 capsule by mouth daily. 60 capsule    aspirin 81 MG EC tablet Take 1 tablet (81 mg total) by mouth daily.  Swallow whole.     Cholecalciferol (VITAMIN D3) 1.25 MG (50000 UT) TABS Take 1 tablet by mouth once a week. 12 tablet 3   Hypromellose (ARTIFICIAL TEARS OP) Place 2 drops into both eyes 2 (two) times daily as needed (for dry eyes).      metoprolol tartrate (LOPRESSOR) 100 MG tablet Take 1 tablet (100 mg total) by mouth once for 1 dose. Take 2 hours prior to CT test 1 tablet 0   No facility-administered medications prior to visit.     Per HPI unless specifically indicated in ROS section below Review of Systems  Objective:  BP 120/76    Pulse 100    Temp (!) 97.3 F (36.3 C) (Temporal)    Ht 6\' 2"  (1.88 m)    Wt  256 lb 1 oz (116.1 kg)    SpO2 96%    BMI 32.88 kg/m   Wt Readings from Last 3 Encounters:  11/16/21 256 lb 1 oz (116.1 kg)  09/24/21 251 lb 12.8 oz (114.2 kg)  08/14/21 247 lb 12.8 oz (112.4 kg)      Physical Exam Vitals and nursing note reviewed.  Constitutional:      Appearance: Normal appearance. He is not ill-appearing.  Eyes:     Extraocular Movements: Extraocular movements intact.     Conjunctiva/sclera: Conjunctivae normal.     Pupils: Pupils are equal, round, and reactive to light.  Cardiovascular:     Rate and Rhythm: Normal rate and regular rhythm.     Pulses: Normal pulses.     Heart sounds: Normal heart sounds. No murmur heard. Pulmonary:     Effort: Pulmonary effort is normal. No respiratory distress.     Breath sounds: Normal breath sounds. No wheezing, rhonchi or rales.  Musculoskeletal:     Right lower leg: No edema.     Left lower leg: No edema.     Comments:  See HPI for foot exam if done 2+ DP bilaterally  Skin:    General: Skin is warm and dry.     Findings: No rash.  Neurological:     Mental Status: He is alert.  Psychiatric:        Mood and Affect: Mood normal.        Behavior: Behavior normal.      Results for orders placed or performed in visit on 11/16/21  POCT glycosylated hemoglobin (Hb A1C)  Result Value Ref Range   Hemoglobin A1C 8.6 (A) 4.0 - 5.6 %   HbA1c POC (<> result, manual entry)     HbA1c, POC (prediabetic range)     HbA1c, POC (controlled diabetic range)      Assessment & Plan:  This visit occurred during the SARS-CoV-2 public health emergency.  Safety protocols were in place, including screening questions prior to the visit, additional usage of staff PPE, and extensive cleaning of exam room while observing appropriate contact time as indicated for disinfecting solutions.   Problem List Items Addressed This Visit     Type 2 diabetes mellitus with other specified complication (HCC) - Primary    Chronic, improved control but  A1c staying above goal <6.5% in CAD. Discussed injectable therapies including weekly GLP1RA as well as oral rybelsus - he desires to avoid at this time. Encouraged renewed efforts to follow diabetic diet.   He will call to schedule diabetic eye exam.  He was never contacted for endo referral - # provided to call for appointment.       Relevant  Orders   POCT glycosylated hemoglobin (Hb A1C) (Completed)   Basic metabolic panel   Dyslipidemia associated with type 2 diabetes mellitus (HCC)    Will return for fasting labs to repeat FLP.  Now on atorvastatin 80mg  daily.  The 10-year ASCVD risk score (Arnett DK, et al., 2019) is: 12%   Values used to calculate the score:     Age: 70 years     Sex: Male     Is Non-Hispanic African American: No     Diabetic: Yes     Tobacco smoker: Yes     Systolic Blood Pressure: 120 mmHg     Is BP treated: Yes     HDL Cholesterol: 29.2 mg/dL     Total Cholesterol: 145 mg/dL       Relevant Orders   Lipid panel   Basic metabolic panel   CAD (coronary artery disease)    Overall reassuring recent cardiac evaluation - will continue medical management with optimizing glycemic control, lipid control and blood pressure control.         No orders of the defined types were placed in this encounter.  Orders Placed This Encounter  Procedures   Lipid panel    Standing Status:   Future    Standing Expiration Date:   11/16/2022   Basic metabolic panel    Standing Status:   Future    Standing Expiration Date:   11/16/2022   POCT glycosylated hemoglobin (Hb A1C)     Patient Instructions  Call [phone] to schedule appointment with endocrinology for diabetes.  Continue low sugar low carb diabetic diet.  Return for fasting labs at your convenience over the next few weeks.  Return in 4 months for follow up visit.   Follow up plan: Return in about 4 months (around 03/16/2022), or if symptoms worsen or fail to improve, for follow up visit.  03/18/2022, MD

## 2021-11-16 NOTE — Assessment & Plan Note (Signed)
Overall reassuring recent cardiac evaluation - will continue medical management with optimizing glycemic control, lipid control and blood pressure control.

## 2021-11-16 NOTE — Assessment & Plan Note (Addendum)
Chronic, improved control but A1c staying above goal <6.5% in CAD. Discussed injectable therapies including weekly GLP1RA as well as oral rybelsus - he desires to avoid at this time. Encouraged renewed efforts to follow diabetic diet.   He will call to schedule diabetic eye exam.  He was never contacted for endo referral - # provided to call for appointment.

## 2021-11-16 NOTE — Assessment & Plan Note (Signed)
Will return for fasting labs to repeat FLP.  Now on atorvastatin 80mg  daily.  The 10-year ASCVD risk score (Arnett DK, et al., 2019) is: 12%   Values used to calculate the score:     Age: 46 years     Sex: Male     Is Non-Hispanic African American: No     Diabetic: Yes     Tobacco smoker: Yes     Systolic Blood Pressure: 120 mmHg     Is BP treated: Yes     HDL Cholesterol: 29.2 mg/dL     Total Cholesterol: 145 mg/dL

## 2021-11-16 NOTE — Patient Instructions (Addendum)
Call [phone] to schedule appointment with endocrinology for diabetes.  Continue low sugar low carb diabetic diet.  Return for fasting labs at your convenience over the next few weeks.  Return in 4 months for follow up visit.

## 2021-11-19 ENCOUNTER — Other Ambulatory Visit: Payer: Self-pay

## 2021-11-19 ENCOUNTER — Other Ambulatory Visit (INDEPENDENT_AMBULATORY_CARE_PROVIDER_SITE_OTHER): Payer: Managed Care, Other (non HMO)

## 2021-11-19 DIAGNOSIS — E785 Hyperlipidemia, unspecified: Secondary | ICD-10-CM | POA: Diagnosis not present

## 2021-11-19 DIAGNOSIS — E1169 Type 2 diabetes mellitus with other specified complication: Secondary | ICD-10-CM | POA: Diagnosis not present

## 2021-11-19 LAB — BASIC METABOLIC PANEL
BUN: 17 mg/dL (ref 6–23)
CO2: 26 mEq/L (ref 19–32)
Calcium: 9.3 mg/dL (ref 8.4–10.5)
Chloride: 102 mEq/L (ref 96–112)
Creatinine, Ser: 0.69 mg/dL (ref 0.40–1.50)
GFR: 111.55 mL/min (ref >60.00)
Glucose, Bld: 144 mg/dL — ABNORMAL HIGH (ref 70–99)
Potassium: 4 mEq/L (ref 3.5–5.1)
Sodium: 138 mEq/L (ref 135–145)

## 2021-11-19 LAB — LDL CHOLESTEROL, DIRECT: Direct LDL: 54 mg/dL

## 2021-11-19 LAB — LIPID PANEL
Cholesterol: 141 mg/dL (ref 0–200)
HDL: 27.8 mg/dL — ABNORMAL LOW (ref >39.00)
Total CHOL/HDL Ratio: 5
Triglycerides: 524 mg/dL — ABNORMAL HIGH (ref 0.0–149.0)

## 2021-11-20 ENCOUNTER — Other Ambulatory Visit: Payer: Self-pay | Admitting: Family Medicine

## 2021-11-20 DIAGNOSIS — E1169 Type 2 diabetes mellitus with other specified complication: Secondary | ICD-10-CM

## 2021-11-20 MED ORDER — ICOSAPENT ETHYL 1 G PO CAPS: 2.0000 g | ORAL_CAPSULE | Freq: Two times a day (BID) | ORAL | 6 refills | Status: DC

## 2021-12-13 ENCOUNTER — Encounter: Payer: Self-pay | Admitting: Family Medicine

## 2022-01-09 ENCOUNTER — Other Ambulatory Visit: Payer: Self-pay

## 2022-01-09 MED ORDER — AMLODIPINE BESYLATE 10 MG PO TABS: ORAL_TABLET | ORAL | 1 refills | Status: DC

## 2022-03-18 ENCOUNTER — Ambulatory Visit: Payer: Managed Care, Other (non HMO) | Admitting: Family Medicine

## 2022-04-02 ENCOUNTER — Ambulatory Visit: Payer: Managed Care, Other (non HMO) | Admitting: Internal Medicine

## 2022-04-02 ENCOUNTER — Encounter: Payer: Self-pay | Admitting: Internal Medicine

## 2022-04-02 VITALS — BP 130/80 | HR 94 | Ht 74.0 in | Wt 254.0 lb

## 2022-04-02 DIAGNOSIS — R739 Hyperglycemia, unspecified: Secondary | ICD-10-CM

## 2022-04-02 DIAGNOSIS — E781 Pure hyperglyceridemia: Secondary | ICD-10-CM | POA: Diagnosis not present

## 2022-04-02 DIAGNOSIS — E1165 Type 2 diabetes mellitus with hyperglycemia: Secondary | ICD-10-CM | POA: Diagnosis not present

## 2022-04-02 LAB — POCT GLYCOSYLATED HEMOGLOBIN (HGB A1C): Hemoglobin A1C: 9.1 % — AB (ref 4.0–5.6)

## 2022-04-02 LAB — POCT GLUCOSE (DEVICE FOR HOME USE): POC Glucose: 238 mg/dl — AB (ref 70–99)

## 2022-04-02 MED ORDER — METFORMIN HCL 500 MG PO TABS: 500.0000 mg | ORAL_TABLET | Freq: Every day | ORAL | 3 refills | Status: DC

## 2022-04-02 MED ORDER — EMPAGLIFLOZIN 25 MG PO TABS: 25.0000 mg | ORAL_TABLET | Freq: Every day | ORAL | 3 refills | Status: DC

## 2022-04-02 MED ORDER — GLIPIZIDE 5 MG PO TABS: 5.0000 mg | ORAL_TABLET | Freq: Every day | ORAL | 2 refills | Status: DC

## 2022-04-02 NOTE — Progress Notes (Signed)
Name: Kyle [name]  MRN/ DOB: [id number], 04/11/1976   Age/ Sex: 46 y.o., male    PCP: Ria Bush, MD   Reason for Endocrinology Evaluation: Type 2 Diabetes Mellitus     Date of Initial Endocrinology Visit: 04/02/2022     PATIENT IDENTIFIER: Kyle [name] is a 46 y.o. male with a past medical history of T2DM, Dyslipidemia, CAD and OSA. The patient presented for initial endocrinology clinic visit on 04/02/2022 for consultative assistance with his diabetes management.    HPI: Kyle [name] was    Diagnosed with DM in 2018 Prior Medications tried/Intolerance: Intolerant to higher doses of metformin . Was on insulin at some point.  Currently checking blood sugars occasionally  Hypoglycemia episodes : no              Hemoglobin A1c has ranged from 6.8% in 2018, peaking at 11.5% in 2022. Patient required assistance for hypoglycemia: no Patient has required hospitalization within the last 1 year from hyper or hypoglycemia: no  In terms of diet, the patient eats 3 meals a day, snacks x2 a day. Avoids sugar-sweetened beverages   No prior dx of pancreatitis  Has chronic GI issues with alternating bowel movement   HOME DIABETES REGIMEN: Jardiance 25 mg daily  Metformin 500 mg 2 tabs daily    Statin: yes ACE-I/ARB: yes    METER DOWNLOAD SUMMARY: Did not bring    DIABETIC COMPLICATIONS: Microvascular complications:   Denies: CKD, neuropathy, retinopathy  Last eye exam: Completed > 2 yrs ago   Macrovascular complications:  CAD Denies:  PVD, CVA   PAST HISTORY: Past Medical History:  Past Medical History:  Diagnosis Date   COVID-19 11/09/2019   Diabetes type 2, controlled ([place])    Dr Gabriel Carina   Dyslipidemia 05/25/2014   Family history of adverse reaction to anesthesia    Mother has postoperatve N&V   GERD 05/11/2007   Gout, unspecified 09/05/2009   History of kidney stones    h/o kidney stones "regularly over the past 5 years"   HTN (hypertension) 09/08/2008   IBS  01/08/2008   SLEEP APNEA 01/08/2008   cannot tolerate CPAP   THYROID STIMULATING HORMONE, ABNORMAL 10/10/2008   Past Surgical History:  Past Surgical History:  Procedure Laterality Date   COLONOSCOPY     HERNIA REPAIR     NASAL SEPTOPLASTY W/ TURBINOPLASTY Bilateral 08/28/2018   Procedure: NASAL SEPTOPLASTY WITH TURBINATE REDUCTION;  Surgeon: Melissa Montane, MD;  Location: [facility];  Service: ENT;  Laterality: Bilateral;   UPPER GASTROINTESTINAL ENDOSCOPY      Social History:  reports that he quit smoking about 18 years ago. His smoking use included cigarettes. He quit smokeless tobacco use about 18 years ago. He reports current alcohol use. He reports that he does not use drugs. Family History:  Family History  Problem Relation Age of Onset   Stroke Other        bilateral grandfathers   Hypertension Other        strong on maternal side   Cancer Sister 67       cervical   Cancer Father 46       prostate s/p radiation seed implant   CAD Father 16   Hypothyroidism Father 62   CAD Mother 10   CAD Other        bilateral grandfathers   Cancer Maternal Uncle        colon   Diabetes Neg Hx      HOME  MEDICATIONS: Allergies as of 04/02/2022       Reactions   Penicillins Swelling, Other (See Comments)   SWELLING REACTION UNSPECIFIED  Has patient had a PCN reaction causing immediate rash, facial/tongue/throat swelling, SOB or lightheadedness with hypotension: No Has patient had a PCN reaction causing severe rash involving mucus membranes or skin necrosis: No Has patient had a PCN reaction that required hospitalization: No Has patient had a PCN reaction occurring within the last 10 years: No If all of the above answers are "NO", then may proceed with Cephalosporin use.        Medication List        Accurate as of April 02, 2022  2:08 PM. If you have any questions, ask your nurse or doctor.          allopurinol 300 MG tablet Commonly known as: ZYLOPRIM TAKE 1 TABLET(300 MG) BY  MOUTH DAILY   amLODipine 10 MG tablet Commonly known as: NORVASC TAKE ONE TABLET DAILY.   atorvastatin 80 MG tablet Commonly known as: LIPITOR Take 1 tablet (80 mg total) by mouth daily.   empagliflozin 25 MG Tabs tablet Commonly known as: JARDIANCE Take 1 tablet (25 mg total) by mouth daily.   icosapent Ethyl 1 g capsule Commonly known as: Vascepa Take 2 capsules (2 g total) by mouth 2 (two) times daily.   losartan 100 MG tablet Commonly known as: COZAAR TAKE 1 TABLET BY MOUTH DAILY   metFORMIN 500 MG tablet Commonly known as: GLUCOPHAGE Take 2 tablets (1,000 mg total) by mouth 2 (two) times daily with a meal. What changed: when to take this   traZODone 50 MG tablet Commonly known as: DESYREL Take 0.5-1 tablets (25-50 mg total) by mouth at bedtime as needed for sleep.         ALLERGIES: Allergies  Allergen Reactions   Penicillins Swelling and Other (See Comments)    SWELLING REACTION UNSPECIFIED  Has patient had a PCN reaction causing immediate rash, facial/tongue/throat swelling, SOB or lightheadedness with hypotension: No Has patient had a PCN reaction causing severe rash involving mucus membranes or skin necrosis: No Has patient had a PCN reaction that required hospitalization: No Has patient had a PCN reaction occurring within the last 10 years: No If all of the above answers are "NO", then may proceed with Cephalosporin use.      REVIEW OF SYSTEMS: A comprehensive ROS was conducted with the patient and is negative except as per HPI and below:  Review of Systems  Gastrointestinal:  Positive for constipation and diarrhea.     OBJECTIVE:   VITAL SIGNS: BP 130/80 (BP Location: Left Arm, Patient Position: Sitting, Cuff Size: Large)   Pulse 94   Ht 6\' 2"  (1.88 m)   Wt 254 lb (115.2 kg)   SpO2 100%   BMI 32.61 kg/m    PHYSICAL EXAM:  General: Pt appears well and is in NAD  Hydration: Well-hydrated with moist mucous membranes and good skin turgor   HEENT: Head: Unremarkable with good dentition. Oropharynx clear without exudate.  Eyes: External eye exam normal without stare, lid lag or exophthalmos.  EOM intact.  PERRL.  Neck: General: Supple without adenopathy or carotid bruits. Thyroid: Thyroid size normal.  No goiter or nodules appreciated. No thyroid bruit.  Lungs: Clear with good BS bilat with no rales, rhonchi, or wheezes  Heart: RRR with normal S1 and S2 and no gallops; no murmurs; no rub  Abdomen: Normoactive bowel sounds, soft, nontender, without masses or organomegaly  palpable  Extremities:  Lower extremities - No pretibial edema. No lesions.  Skin: Normal texture and temperature to palpation. No rash noted. No Acanthosis nigricans/skin tags. No lipohypertrophy.  Neuro: MS is good with appropriate affect, pt is alert and Ox3    DM foot exam: 04/02/2022  The skin of the feet is intact without sores or ulcerations. The pedal pulses are 2+ on right and 2+ on left. The sensation is intact to a screening 5.07, 10 gram monofilament bilaterally   DATA REVIEWED:  Lab Results  Component Value Date   HGBA1C 9.1 (A) 04/02/2022   HGBA1C 8.6 (A) 11/16/2021   HGBA1C 11.2 (H) 08/14/2021     Latest Reference Range & Units 11/19/21 08:29  Sodium 135 - 145 mEq/L 138  Potassium 3.5 - 5.1 mEq/L 4.0  Chloride 96 - 112 mEq/L 102  CO2 19 - 32 mEq/L 26  Glucose 70 - 99 mg/dL 144 (H)  BUN 6 - 23 mg/dL 17  Creatinine 0.40 - 1.50 mg/dL 0.69  Calcium 8.4 - 10.5 mg/dL 9.3  GFR >60.00 mL/min 111.55     Latest Reference Range & Units 11/19/21 08:29  Total CHOL/HDL Ratio  5  Cholesterol 0 - 200 mg/dL 141  HDL Cholesterol >39.00 mg/dL 27.80 (L)  Direct LDL mg/dL 54.0  Triglycerides 0.0 - 149.0 mg/dL 524.0 Triglyceride is over 400; calculations on Lipids are invalid. (H)      ASSESSMENT / PLAN / RECOMMENDATIONS:   1) Type 2 Diabetes Mellitus, Poorly controlled, With Macrovascular  complications - Most recent A1c of 9.1 %. Goal A1c <  7.0 %.    Plan: GENERAL: I have discussed with the patient the pathophysiology of diabetes. We went over the natural progression of the disease. We talked about both insulin resistance and insulin deficiency. We stressed the importance of lifestyle changes including diet and exercise. I explained the complications associated with diabetes including retinopathy, nephropathy, neuropathy as well as increased risk of cardiovascular disease. We went over the benefit seen with glycemic control.  I explained to the patient that diabetic patients are at higher than normal risk for amputations.  Will avoid LP-1A at this time due to risk of pancreatitis  Will start Glipizide, cautioned against hypoglycemia and weight loss   MEDICATIONS: Start glipizide 5 mg daily Continue metformin 500 mg, 2 tabs daily Continue Jardiance 25 mg daily  EDUCATION / INSTRUCTIONS: BG monitoring instructions: Patient is instructed to check his blood sugars 1 times a day, fasting . Call [facility] if: BG persistently < 70  I reviewed the Rule of 15 for the treatment of hypoglycemia in detail with the patient. Literature supplied.   2) Diabetic complications:  Eye: Does not have known diabetic retinopathy.  Neuro/ Feet: Does not have known diabetic peripheral neuropathy. Renal: Patient does not have known baseline CKD. He is  on an ACEI/ARB at present.  3) Hypertriglyceridemia :   -  Discussed risk of pancreatitis  - Discussed cardiovascular benefits of statin therapy  -LDL at goal -Emphasized importance of low-fat and low-carb diet.  He only drinks alcohol socially -Lipitor was increased from 20 to 80 mg after his labs in January, I am hoping his TG will improve with glycemic control     Medication Continue atorvastatin 80 mg daily   Signed electronically by: Mack Guise, MD  [facility]  [facility] [address] [place], [place]  [address] Phone: [phone] FAX: [phone]   CC: Ria Bush,  MD [place] [address] Phone: [phone]  Fax: [phone]    Return to Endocrinology clinic as below: No future appointments.

## 2022-04-02 NOTE — Patient Instructions (Signed)
-   Start Glipizide 5 mg, 1 tablet before first meal of the day  - Continue Jardiance 25 mg daily - Continue Metformin 500 mg, two tablets daily      HOW TO TREAT LOW BLOOD SUGARS (Blood sugar LESS THAN 70 MG/DL) Please follow the RULE OF 15 for the treatment of hypoglycemia treatment (when your (blood sugars are less than 70 mg/dL)   STEP 1: Take 15 grams of carbohydrates when your blood sugar is low, which includes:  3-4 GLUCOSE TABS  OR 3-4 OZ OF JUICE OR REGULAR SODA OR ONE TUBE OF GLUCOSE GEL    STEP 2: RECHECK blood sugar in 15 MINUTES STEP 3: If your blood sugar is still low at the 15 minute recheck --> then, go back to STEP 1 and treat AGAIN with another 15 grams of carbohydrates.

## 2022-04-03 ENCOUNTER — Other Ambulatory Visit: Payer: Self-pay

## 2022-04-03 MED ORDER — METFORMIN HCL 500 MG PO TABS: 500.0000 mg | ORAL_TABLET | Freq: Two times a day (BID) | ORAL | 3 refills | Status: DC

## 2022-04-25 ENCOUNTER — Other Ambulatory Visit: Payer: Self-pay | Admitting: Family Medicine

## 2022-04-25 NOTE — Telephone Encounter (Signed)
Refilled by endo on 04/02/22 and 04/03/22.

## 2022-05-26 ENCOUNTER — Other Ambulatory Visit: Payer: Self-pay | Admitting: Family Medicine

## 2022-05-27 NOTE — Telephone Encounter (Signed)
E-scribed refill.  Plz schedule CPE and lab visits to prevent delays in future refills.  

## 2022-06-19 LAB — HM DIABETES EYE EXAM

## 2022-08-06 ENCOUNTER — Encounter: Payer: Self-pay | Admitting: Internal Medicine

## 2022-08-06 ENCOUNTER — Ambulatory Visit: Payer: Managed Care, Other (non HMO) | Admitting: Internal Medicine

## 2022-08-06 VITALS — BP 136/80 | HR 94 | Ht 74.0 in | Wt 260.0 lb

## 2022-08-06 DIAGNOSIS — E781 Pure hyperglyceridemia: Secondary | ICD-10-CM | POA: Diagnosis not present

## 2022-08-06 DIAGNOSIS — E1165 Type 2 diabetes mellitus with hyperglycemia: Secondary | ICD-10-CM

## 2022-08-06 LAB — POCT GLUCOSE (DEVICE FOR HOME USE): POC Glucose: 388 mg/dl — AB (ref 70–99)

## 2022-08-06 LAB — POCT GLYCOSYLATED HEMOGLOBIN (HGB A1C): Hemoglobin A1C: 10.2 % — AB (ref 4.0–5.6)

## 2022-08-06 MED ORDER — GLIPIZIDE 10 MG PO TABS: 10.0000 mg | ORAL_TABLET | Freq: Two times a day (BID) | ORAL | 2 refills | Status: DC

## 2022-08-06 NOTE — Progress Notes (Signed)
Name: Kyle [name]  MRN/ DOB: [id number], [birth year]   Age/ Sex: 46 y.o., male    PCP: Ria Bush, MD   Reason for Endocrinology Evaluation: Type 2 Diabetes Mellitus     Date of Initial Endocrinology Visit: 04/02/2022    PATIENT IDENTIFIER: Kyle [name] is a 46 y.o. male with a past medical history of T2DM, Dyslipidemia, CAD and OSA. The patient presented for initial endocrinology clinic visit on 04/02/2022 for consultative assistance with his diabetes management.    HPI: Kyle [name] was    Diagnosed with DM in 2018 Prior Medications tried/Intolerance: Intolerant to higher doses of metformin . Was on insulin at some point.            Hemoglobin A1c has ranged from 6.8% in 2018, peaking at 11.5% in 2022.  No prior dx of pancreatitis  Has chronic GI issues with alternating bowel movement    On his initial visit to our clinic his A1c was 9.1%, he was on Jardiance and metformin which we continued and added glipizide  SUBJECTIVE:   During the last visit (04/02/2022): A1C 9.1%  Today (08/06/22): Kyle [name] is here for follow-up on diabetes management.  He checks his blood sugars 1 times a week .  The patient has not  had hypoglycemic episodes since the last clinic visit He is out of jardiance 3-4 weeks because he has no  refills.     HOME DIABETES REGIMEN: Jardiance 25 mg daily  Metformin 500 mg 2 tabs daily  Glipizide 5 mg daily      Statin: yes ACE-I/ARB: yes    METER DOWNLOAD SUMMARY: Did not bring    DIABETIC COMPLICATIONS: Microvascular complications:   Denies: CKD, neuropathy, retinopathy  Last eye exam: Completed > 2 yrs ago   Macrovascular complications:  CAD Denies:  PVD, CVA   PAST HISTORY: Past Medical History:  Past Medical History:  Diagnosis Date   COVID-19 11/09/2019   Diabetes type 2, controlled ([facility])    Dr Gabriel Carina   Dyslipidemia 05/25/2014   Family history of adverse reaction to anesthesia    Mother has postoperatve N&V   GERD  05/11/2007   Gout, unspecified 09/05/2009   History of kidney stones    h/o kidney stones "regularly over the past 5 years"   HTN (hypertension) 09/08/2008   IBS 01/08/2008   SLEEP APNEA 01/08/2008   cannot tolerate CPAP   THYROID STIMULATING HORMONE, ABNORMAL 10/10/2008   Past Surgical History:  Past Surgical History:  Procedure Laterality Date   COLONOSCOPY     HERNIA REPAIR     NASAL SEPTOPLASTY W/ TURBINOPLASTY Bilateral 08/28/2018   Procedure: NASAL SEPTOPLASTY WITH TURBINATE REDUCTION;  Surgeon: Melissa Montane, MD;  Location: [facility];  Service: ENT;  Laterality: Bilateral;   UPPER GASTROINTESTINAL ENDOSCOPY      Social History:  reports that he quit smoking about 18 years ago. His smoking use included cigarettes. He quit smokeless tobacco use about 18 years ago. He reports current alcohol use. He reports that he does not use drugs. Family History:  Family History  Problem Relation Age of Onset   Stroke Other        bilateral grandfathers   Hypertension Other        strong on maternal side   Cancer Sister 22       cervical   Cancer Father 31       prostate s/p radiation seed implant   CAD Father 18   Hypothyroidism Father 54  CAD Mother 5   CAD Other        bilateral grandfathers   Cancer Maternal Uncle        colon   Diabetes Neg Hx      HOME MEDICATIONS: Allergies as of 08/06/2022       Reactions   Penicillins Swelling, Other (See Comments)   SWELLING REACTION UNSPECIFIED  Has patient had a PCN reaction causing immediate rash, facial/tongue/throat swelling, SOB or lightheadedness with hypotension: No Has patient had a PCN reaction causing severe rash involving mucus membranes or skin necrosis: No Has patient had a PCN reaction that required hospitalization: No Has patient had a PCN reaction occurring within the last 10 years: No If all of the above answers are "NO", then may proceed with Cephalosporin use.        Medication List        Accurate as of  August 06, 2022  2:09 PM. If you have any questions, ask your nurse or doctor.          allopurinol 300 MG tablet Commonly known as: ZYLOPRIM TAKE 1 TABLET(300 MG) BY MOUTH DAILY   amLODipine 10 MG tablet Commonly known as: NORVASC TAKE ONE TABLET DAILY.   atorvastatin 80 MG tablet Commonly known as: LIPITOR TAKE 1 TABLET(80 MG) BY MOUTH DAILY   empagliflozin 25 MG Tabs tablet Commonly known as: JARDIANCE Take 1 tablet (25 mg total) by mouth daily.   glipiZIDE 5 MG tablet Commonly known as: GLUCOTROL Take 1 tablet (5 mg total) by mouth daily before breakfast.   losartan 100 MG tablet Commonly known as: COZAAR TAKE 1 TABLET BY MOUTH DAILY   metFORMIN 500 MG tablet Commonly known as: GLUCOPHAGE Take 1 tablet (500 mg total) by mouth 2 (two) times daily with a meal.   traZODone 50 MG tablet Commonly known as: DESYREL Take 0.5-1 tablets (25-50 mg total) by mouth at bedtime as needed for sleep.         ALLERGIES: Allergies  Allergen Reactions   Penicillins Swelling and Other (See Comments)    SWELLING REACTION UNSPECIFIED  Has patient had a PCN reaction causing immediate rash, facial/tongue/throat swelling, SOB or lightheadedness with hypotension: No Has patient had a PCN reaction causing severe rash involving mucus membranes or skin necrosis: No Has patient had a PCN reaction that required hospitalization: No Has patient had a PCN reaction occurring within the last 10 years: No If all of the above answers are "NO", then may proceed with Cephalosporin use.         OBJECTIVE:   VITAL SIGNS: BP 136/80 (BP Location: Left Arm, Patient Position: Sitting, Cuff Size: Large)   Pulse 94   Ht 6\' 2"  (1.88 m)   Wt 260 lb (117.9 kg)   SpO2 91%   BMI 33.38 kg/m    PHYSICAL EXAM:  General: Pt appears well and is in NAD  Lungs: Clear with good BS bilat with no rales, rhonchi, or wheezes  Heart: RRR   Abdomen: Normoactive bowel sounds, soft, nontender, without  masses or organomegaly palpable  Extremities:  Lower extremities - No pretibial edema. No lesions.  Neuro: MS is good with appropriate affect, pt is alert and Ox3    DM foot exam: 04/02/2022  The skin of the feet is intact without sores or ulcerations. The pedal pulses are 2+ on right and 2+ on left. The sensation is intact to a screening 5.07, 10 gram monofilament bilaterally   DATA REVIEWED:  Lab Results  Component Value Date   HGBA1C 9.1 (A) 04/02/2022   HGBA1C 8.6 (A) 11/16/2021   HGBA1C 11.2 (H) 08/14/2021     Latest Reference Range & Units 11/19/21 08:29  Sodium 135 - 145 mEq/L 138  Potassium 3.5 - 5.1 mEq/L 4.0  Chloride 96 - 112 mEq/L 102  CO2 19 - 32 mEq/L 26  Glucose 70 - 99 mg/dL 144 (H)  BUN 6 - 23 mg/dL 17  Creatinine 0.40 - 1.50 mg/dL 0.69  Calcium 8.4 - 10.5 mg/dL 9.3  GFR >60.00 mL/min 111.55     Latest Reference Range & Units 11/19/21 08:29  Total CHOL/HDL Ratio  5  Cholesterol 0 - 200 mg/dL 141  HDL Cholesterol >39.00 mg/dL 27.80 (L)  Direct LDL mg/dL 54.0  Triglycerides 0.0 - 149.0 mg/dL 524.0 Triglyceride is over 400; calculations on Lipids are invalid. (H)      ASSESSMENT / PLAN / RECOMMENDATIONS:   1) Type 2 Diabetes Mellitus, Poorly controlled, With Macrovascular  complications - Most recent A1c of 10.2 %. Goal A1c < 7.0 %.    - His A1c has increased from 9.1 % to 10.2 % despite STARTING Glipizide ????  I suspect there is medication nonadherence.   - He tells me he is out of  Jardiance for 3-4 weeks due to lack of refills but I had refilled this on 6/6 for a year supply , my assistant contacted the pharmacy and verified that the pharmacy did indeed receive my prescription from April 02, 2022 -I have recommended starting insulin therapy, patient would like to avoid this at this time, and would like to work on lifestyle changes -I going to increase his glipizide, I again emphasized the importance of taking this 15-20 minutes before the first and the  last meal of the day, caution against hypoglycemia -Would like to avoid GLP-1 agonist at this time due to elevated triglycerides and the risk of pancreatitis  MEDICATIONS: Increase glipizide 10 mg twice daily Continue metformin 500 mg, 2 tabs daily Restart Jardiance 25 mg daily  EDUCATION / INSTRUCTIONS: BG monitoring instructions: Patient is instructed to check his blood sugars 1 times a day, fasting . Call [facility] if: BG persistently < 70  I reviewed the Rule of 15 for the treatment of hypoglycemia in detail with the patient. Literature supplied.   2) Diabetic complications:  Eye: Does not have known diabetic retinopathy.  Neuro/ Feet: Does not have known diabetic peripheral neuropathy. Renal: Patient does not have known baseline CKD. He is  on an ACEI/ARB at present.  3) Hypertriglyceridemia :   - We had discussed risk of pancreatitis in the past -LDL at goal  Medication Continue atorvastatin 80 mg daily      Signed electronically by: Mack Guise, MD  [facility]  [facility] [address] [place], [place] [address] Phone: [phone] FAX: [phone]   CC: Ria Bush, MD [place] [address] Phone: [phone]  Fax: [phone]    Return to Endocrinology clinic as below: Future Appointments  Date Time Provider [place]  08/28/2022  7:30 AM [facility]  09/04/2022  2:30 PM Ria Bush, MD [facility]

## 2022-08-06 NOTE — Patient Instructions (Signed)
-   Increase  Glipizide 10 mg, 1 tablet before first meal of the day and last meal of the day  - Restart  Jardiance 25 mg daily - Continue Metformin 500 mg, two tablets daily      HOW TO TREAT LOW BLOOD SUGARS (Blood sugar LESS THAN 70 MG/DL) Please follow the RULE OF 15 for the treatment of hypoglycemia treatment (when your (blood sugars are less than 70 mg/dL)   STEP 1: Take 15 grams of carbohydrates when your blood sugar is low, which includes:  3-4 GLUCOSE TABS  OR 3-4 OZ OF JUICE OR REGULAR SODA OR ONE TUBE OF GLUCOSE GEL    STEP 2: RECHECK blood sugar in 15 MINUTES STEP 3: If your blood sugar is still low at the 15 minute recheck --> then, go back to STEP 1 and treat AGAIN with another 15 grams of carbohydrates.

## 2022-08-25 ENCOUNTER — Other Ambulatory Visit: Payer: Self-pay | Admitting: Family Medicine

## 2022-08-25 DIAGNOSIS — Z1159 Encounter for screening for other viral diseases: Secondary | ICD-10-CM

## 2022-08-25 DIAGNOSIS — E559 Vitamin D deficiency, unspecified: Secondary | ICD-10-CM

## 2022-08-25 DIAGNOSIS — M1A9XX0 Chronic gout, unspecified, without tophus (tophi): Secondary | ICD-10-CM

## 2022-08-25 DIAGNOSIS — E1169 Type 2 diabetes mellitus with other specified complication: Secondary | ICD-10-CM

## 2022-08-28 ENCOUNTER — Other Ambulatory Visit: Payer: Managed Care, Other (non HMO)

## 2022-08-30 ENCOUNTER — Other Ambulatory Visit: Payer: Self-pay | Admitting: Family Medicine

## 2022-08-30 ENCOUNTER — Other Ambulatory Visit: Payer: Managed Care, Other (non HMO)

## 2022-08-30 DIAGNOSIS — M1A9XX0 Chronic gout, unspecified, without tophus (tophi): Secondary | ICD-10-CM

## 2022-09-02 ENCOUNTER — Other Ambulatory Visit (INDEPENDENT_AMBULATORY_CARE_PROVIDER_SITE_OTHER): Payer: Managed Care, Other (non HMO)

## 2022-09-02 DIAGNOSIS — Z1159 Encounter for screening for other viral diseases: Secondary | ICD-10-CM

## 2022-09-02 DIAGNOSIS — E1169 Type 2 diabetes mellitus with other specified complication: Secondary | ICD-10-CM | POA: Diagnosis not present

## 2022-09-02 DIAGNOSIS — E559 Vitamin D deficiency, unspecified: Secondary | ICD-10-CM | POA: Diagnosis not present

## 2022-09-02 DIAGNOSIS — M1A9XX0 Chronic gout, unspecified, without tophus (tophi): Secondary | ICD-10-CM

## 2022-09-02 DIAGNOSIS — E785 Hyperlipidemia, unspecified: Secondary | ICD-10-CM

## 2022-09-02 LAB — COMPREHENSIVE METABOLIC PANEL
ALT: 53 U/L (ref 0–53)
AST: 30 U/L (ref 0–37)
Albumin: 4.9 g/dL (ref 3.5–5.2)
Alkaline Phosphatase: 45 U/L (ref 39–117)
BUN: 14 mg/dL (ref 6–23)
CO2: 29 mEq/L (ref 19–32)
Calcium: 9.9 mg/dL (ref 8.4–10.5)
Chloride: 102 mEq/L (ref 96–112)
Creatinine, Ser: 0.67 mg/dL (ref 0.40–1.50)
GFR: 111.93 mL/min (ref >60.00)
Glucose, Bld: 164 mg/dL — ABNORMAL HIGH (ref 70–99)
Potassium: 4.1 mEq/L (ref 3.5–5.1)
Sodium: 141 mEq/L (ref 135–145)
Total Bilirubin: 0.9 mg/dL (ref 0.2–1.2)
Total Protein: 7.4 g/dL (ref 6.0–8.3)

## 2022-09-02 LAB — LIPID PANEL
Cholesterol: 135 mg/dL (ref 0–200)
HDL: 33.8 mg/dL — ABNORMAL LOW (ref >39.00)
NonHDL: 100.87
Total CHOL/HDL Ratio: 4
Triglycerides: 308 mg/dL — ABNORMAL HIGH (ref 0.0–149.0)
VLDL: 61.6 mg/dL — ABNORMAL HIGH (ref 0.0–40.0)

## 2022-09-02 LAB — LDL CHOLESTEROL, DIRECT: Direct LDL: 67 mg/dL

## 2022-09-02 LAB — URIC ACID: Uric Acid, Serum: 4.6 mg/dL (ref 4.0–7.8)

## 2022-09-02 LAB — VITAMIN D 25 HYDROXY (VIT D DEFICIENCY, FRACTURES): VITD: 23.21 ng/mL — ABNORMAL LOW (ref 30.00–100.00)

## 2022-09-03 LAB — HEPATITIS C ANTIBODY: Hepatitis C Ab: NONREACTIVE

## 2022-09-04 ENCOUNTER — Ambulatory Visit (INDEPENDENT_AMBULATORY_CARE_PROVIDER_SITE_OTHER): Payer: Managed Care, Other (non HMO) | Admitting: Family Medicine

## 2022-09-04 ENCOUNTER — Other Ambulatory Visit: Payer: Self-pay

## 2022-09-04 ENCOUNTER — Encounter: Payer: Self-pay | Admitting: Family Medicine

## 2022-09-04 VITALS — BP 130/78 | HR 106 | Temp 97.3°F | Ht 73.5 in | Wt 251.4 lb

## 2022-09-04 DIAGNOSIS — Z1211 Encounter for screening for malignant neoplasm of colon: Secondary | ICD-10-CM | POA: Diagnosis not present

## 2022-09-04 DIAGNOSIS — K582 Mixed irritable bowel syndrome: Secondary | ICD-10-CM

## 2022-09-04 DIAGNOSIS — M1A9XX0 Chronic gout, unspecified, without tophus (tophi): Secondary | ICD-10-CM | POA: Diagnosis not present

## 2022-09-04 DIAGNOSIS — Z23 Encounter for immunization: Secondary | ICD-10-CM

## 2022-09-04 DIAGNOSIS — I2510 Atherosclerotic heart disease of native coronary artery without angina pectoris: Secondary | ICD-10-CM

## 2022-09-04 DIAGNOSIS — E785 Hyperlipidemia, unspecified: Secondary | ICD-10-CM

## 2022-09-04 DIAGNOSIS — Z0000 Encounter for general adult medical examination without abnormal findings: Secondary | ICD-10-CM | POA: Diagnosis not present

## 2022-09-04 DIAGNOSIS — F4323 Adjustment disorder with mixed anxiety and depressed mood: Secondary | ICD-10-CM

## 2022-09-04 DIAGNOSIS — E1169 Type 2 diabetes mellitus with other specified complication: Secondary | ICD-10-CM

## 2022-09-04 DIAGNOSIS — E781 Pure hyperglyceridemia: Secondary | ICD-10-CM

## 2022-09-04 DIAGNOSIS — K219 Gastro-esophageal reflux disease without esophagitis: Secondary | ICD-10-CM

## 2022-09-04 DIAGNOSIS — I10 Essential (primary) hypertension: Secondary | ICD-10-CM

## 2022-09-04 DIAGNOSIS — E559 Vitamin D deficiency, unspecified: Secondary | ICD-10-CM

## 2022-09-04 DIAGNOSIS — E1165 Type 2 diabetes mellitus with hyperglycemia: Secondary | ICD-10-CM

## 2022-09-04 DIAGNOSIS — E669 Obesity, unspecified: Secondary | ICD-10-CM

## 2022-09-04 MED ORDER — TRAZODONE HCL 50 MG PO TABS: 25.0000 mg | ORAL_TABLET | Freq: Every evening | ORAL | 3 refills | Status: DC | PRN

## 2022-09-04 MED ORDER — ALLOPURINOL 300 MG PO TABS: 300.0000 mg | ORAL_TABLET | Freq: Every day | ORAL | 3 refills | Status: DC

## 2022-09-04 MED ORDER — AMLODIPINE BESYLATE 10 MG PO TABS: ORAL_TABLET | ORAL | 3 refills | Status: DC

## 2022-09-04 MED ORDER — LOSARTAN POTASSIUM 100 MG PO TABS: ORAL_TABLET | ORAL | 3 refills | Status: DC

## 2022-09-04 MED ORDER — VITAMIN D3 1.25 MG (50000 UT) PO TABS: 1.0000 | ORAL_TABLET | ORAL | 3 refills | Status: DC

## 2022-09-04 MED ORDER — ATORVASTATIN CALCIUM 80 MG PO TABS: 80.0000 mg | ORAL_TABLET | Freq: Every day | ORAL | 3 refills | Status: DC

## 2022-09-04 NOTE — Patient Instructions (Addendum)
We will request latest diabetic eye from [facility] doctor in [place], on [address].  Flu shot today  Medicines refilled.  Give GI a call at your convenience to discuss colonoscopy at [phone] option 1  Start weekly vitamin D 50k units - prescription sent to pharmacy.  Consider CoEnzyme Q10 supplement daily.  Good to see you today  Return as needed or in 1 year for next physical.   Health Maintenance, Male Adopting a healthy lifestyle and getting preventive care are important in promoting health and wellness. Ask your health care provider about: The right schedule for you to have regular tests and exams. Things you can do on your own to prevent diseases and keep yourself healthy. What should I know about diet, weight, and exercise? Eat a healthy diet  Eat a diet that includes plenty of vegetables, fruits, low-fat dairy products, and lean protein. Do not eat a lot of foods that are high in solid fats, added sugars, or sodium. Maintain a healthy weight Body mass index (BMI) is a measurement that can be used to identify possible weight problems. It estimates body fat based on height and weight. Your health care provider can help determine your BMI and help you achieve or maintain a healthy weight. Get regular exercise Get regular exercise. This is one of the most important things you can do for your health. Most adults should: Exercise for at least 150 minutes each week. The exercise should increase your heart rate and make you sweat (moderate-intensity exercise). Do strengthening exercises at least twice a week. This is in addition to the moderate-intensity exercise. Spend less time sitting. Even light physical activity can be beneficial. Watch cholesterol and blood lipids Have your blood tested for lipids and cholesterol at 46 years of age, then have this test every 5 years. You may need to have your cholesterol levels checked more often if: Your lipid or cholesterol levels are  high. You are older than 46 years of age. You are at high risk for heart disease. What should I know about cancer screening? Many types of cancers can be detected early and may often be prevented. Depending on your health history and family history, you may need to have cancer screening at various ages. This may include screening for: Colorectal cancer. Prostate cancer. Skin cancer. Lung cancer. What should I know about heart disease, diabetes, and high blood pressure? Blood pressure and heart disease High blood pressure causes heart disease and increases the risk of stroke. This is more likely to develop in people who have high blood pressure readings or are overweight. Talk with your health care provider about your target blood pressure readings. Have your blood pressure checked: Every 3-5 years if you are 46 years of age. Every year if you are 4 years old or older. If you are between the ages of 71 and 36 and are a current or former smoker, ask your health care provider if you should have a one-time screening for abdominal aortic aneurysm (AAA). Diabetes Have regular diabetes screenings. This checks your fasting blood sugar level. Have the screening done: Once every three years after age 46 if you are at a normal weight and have a low risk for diabetes. More often and at a younger age if you are overweight or have a high risk for diabetes. What should I know about preventing infection? Hepatitis B If you have a higher risk for hepatitis B, you should be screened for this virus. Talk with your health care  provider to find out if you are at risk for hepatitis B infection. Hepatitis C Blood testing is recommended for: Everyone born from [birth year] through 1965. Anyone with known risk factors for hepatitis C. Sexually transmitted infections (STIs) You should be screened each year for STIs, including gonorrhea and chlamydia, if: You are sexually active and are younger than 46 years of  age. You are older than 46 years of age and your health care provider tells you that you are at risk for this type of infection. Your sexual activity has changed since you were last screened, and you are at increased risk for chlamydia or gonorrhea. Ask your health care provider if you are at risk. Ask your health care provider about whether you are at high risk for HIV. Your health care provider may recommend a prescription medicine to help prevent HIV infection. If you choose to take medicine to prevent HIV, you should first get tested for HIV. You should then be tested every 3 months for as long as you are taking the medicine. Follow these instructions at home: Alcohol use Do not drink alcohol if your health care provider tells you not to drink. If you drink alcohol: Limit how much you have to 0-2 drinks a day. Know how much alcohol is in your drink. In the [place] one drink equals one 12 oz bottle of beer (355 mL), one 5 oz glass of wine (148 mL), or one 1 oz glass of hard liquor (44 mL). Lifestyle Do not use any products that contain nicotine or tobacco. These products include cigarettes, chewing tobacco, and vaping devices, such as e-cigarettes. If you need help quitting, ask your health care provider. Do not use street drugs. Do not share needles. Ask your health care provider for help if you need support or information about quitting drugs. General instructions Schedule regular health, dental, and eye exams. Stay current with your vaccines. Tell your health care provider if: You often feel depressed. You have ever been abused or do not feel safe at home. Summary Adopting a healthy lifestyle and getting preventive care are important in promoting health and wellness. Follow your health care provider's instructions about healthy diet, exercising, and getting tested or screened for diseases. Follow your health care provider's instructions on monitoring your cholesterol and blood  pressure. This information is not intended to replace advice given to you by your health care provider. Make sure you discuss any questions you have with your health care provider. Document Revised: 03/05/2021 Document Reviewed: 03/05/2021 Elsevier Patient Education  2023 [employer].

## 2022-09-04 NOTE — Assessment & Plan Note (Addendum)
Anticipate driven by hyperglycemia - reassess with better sugar control. Continue high potency statin.

## 2022-09-04 NOTE — Assessment & Plan Note (Signed)
Preventative protocols reviewed and updated unless pt declined. Discussed healthy diet and lifestyle.  

## 2022-09-04 NOTE — Assessment & Plan Note (Signed)
Continue allopurinol 300mg  daily. Recurrent gout flare when he stopped allopurinol.

## 2022-09-04 NOTE — Assessment & Plan Note (Signed)
Chronic, stable. Continue current regimen. 

## 2022-09-04 NOTE — Assessment & Plan Note (Signed)
Chronic, stable on high dose atorvastatin - continue. The 10-year ASCVD risk score (Arnett DK, et al., 2019) is: 4.4%   Values used to calculate the score:     Age: 46 years     Sex: Male     Is Non-Hispanic African American: No     Diabetic: Yes     Tobacco smoker: No     Systolic Blood Pressure: 130 mmHg     Is BP treated: Yes     HDL Cholesterol: 33.8 mg/dL     Total Cholesterol: 135 mg/dL

## 2022-09-04 NOTE — Assessment & Plan Note (Signed)
Now sees endo on jardiance, glipizide, metformin. Appreciate endo care.

## 2022-09-04 NOTE — Assessment & Plan Note (Signed)
Stable period off medication.  

## 2022-09-04 NOTE — Assessment & Plan Note (Signed)
States unable to maintain levels >30 despite taking up to 4000 IU daily.  Will Rx weekly 50k dose.

## 2022-09-04 NOTE — Assessment & Plan Note (Signed)
Continue to encourage healthy diet and lifestyle choices to affect sustainable weight loss.  °

## 2022-09-04 NOTE — Progress Notes (Signed)
Patient ID: Kyle [name], male    DOB: 05-04-1976, 46 y.o.   MRN: [id number]  This visit was conducted in person.  BP 130/78   Pulse (!) 106   Temp (!) 97.3 F (36.3 C) (Temporal)   Ht 6' 1.5" (1.867 m)   Wt 251 lb 6.4 oz (114 kg)   SpO2 96%   BMI 32.72 kg/m    CC: CPE Subjective:   HPI: Kyle [name] is a 46 y.o. male presenting on 09/04/2022 for Annual Exam   Saw cardiology last year - CT coronary angio 07/2021 - mild nonobstructive with CAC score of 2.1 (77%ile). No recent chest discomfort.   DM - followed by endo Shamleffer. Has been off jardiance for a few , taking metformin 2000mg  daily. he's started walking 1.5 miles daily (walking new german shepherd dog).   He stopped allopurinol- but restarted due to gout flare.    Preventative:   Colon cancer screening - colonoscopy 2007. Will re refer in [place].  Prostate cancer in father age 69 - father passed away from this (mets) - check yearly Lung cancer screening - not eligible  Flu shot yearly Td 2009, Tdap 2019 Pneumovax 2015.  COVID vaccine - Pfizer 01/2020, 02/2020, no boosters Seat belt use discussed  Sunscreen use discussed. No changing moles.  Non smoker  Alcohol - 3-4 glasses of wine/wk Dentist Q6 mo  Eye exam yearly    Caffeine: 4 cups coffee/day Widower. Wife passed from COVID 11/2019. 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 Interim medical history since our last visit reviewed. Allergies and medications reviewed and updated. Outpatient Medications Prior to Visit  Medication Sig Dispense Refill   empagliflozin (JARDIANCE) 25 MG TABS tablet Take 1 tablet (25 mg total) by mouth daily. 90 tablet 3   glipiZIDE (GLUCOTROL) 10 MG tablet Take 1 tablet (10 mg total) by mouth 2 (two) times daily  before a meal. 180 tablet 2   metFORMIN (GLUCOPHAGE) 500 MG tablet Take 1 tablet (500 mg total) by mouth 2 (two) times daily with a meal. 180 tablet 3   allopurinol (ZYLOPRIM) 300 MG tablet TAKE 1 TABLET(300 MG) BY MOUTH DAILY 90 tablet 0   amLODipine (NORVASC) 10 MG tablet TAKE ONE TABLET DAILY. 90 tablet 1   atorvastatin (LIPITOR) 80 MG tablet TAKE 1 TABLET(80 MG) BY MOUTH DAILY 90 tablet 0   losartan (COZAAR) 100 MG tablet TAKE 1 TABLET BY MOUTH DAILY 90 tablet 3   traZODone (DESYREL) 50 MG tablet Take 0.5-1 tablets (25-50 mg total) by mouth at bedtime as needed for sleep. 90 tablet 3   No facility-administered medications prior to visit.     Per HPI unless specifically indicated in ROS section below Review of Systems  Constitutional:  Negative for activity change, appetite change, chills, fatigue, fever and unexpected weight change.  HENT:  Negative for hearing loss.   Eyes:  Negative for visual disturbance.  Respiratory:  Negative for cough, chest tightness, shortness of breath and wheezing.   Cardiovascular:  Negative for chest pain, palpitations and leg swelling.  Gastrointestinal:  Positive for constipation and diarrhea. Negative for abdominal distention, abdominal pain, blood in stool, nausea and vomiting.       Chronic  Genitourinary:  Negative for difficulty urinating and hematuria.  Musculoskeletal:  Negative for arthralgias, myalgias and neck pain.       Occ calf discomfort  Skin:  Negative for rash.  Neurological:  Positive for headaches (this week x1). Negative for dizziness, seizures and syncope.  Hematological:  Negative for adenopathy. Does not bruise/bleed easily.  Psychiatric/Behavioral:  Negative for dysphoric mood. The patient is not nervous/anxious.     Objective:  BP 130/78   Pulse (!) 106   Temp (!) 97.3 F (36.3 C) (Temporal)   Ht 6' 1.5" (1.867 m)   Wt 251 lb 6.4 oz (114 kg)   SpO2 96%   BMI 32.72 kg/m   Wt Readings from Last 3 Encounters:  09/04/22  251 lb 6.4 oz (114 kg)  08/06/22 260 lb (117.9 kg)  04/02/22 254 lb (115.2 kg)      Physical Exam Vitals and nursing note reviewed.  Constitutional:      General: He is not in acute distress.    Appearance: Normal appearance. He is well-developed. He is not ill-appearing.  HENT:     Head: Normocephalic and atraumatic.     Right Ear: Hearing, tympanic membrane, ear canal and external ear normal.     Left Ear: Hearing, tympanic membrane, ear canal and external ear normal.     Nose: Nose normal.     Mouth/Throat:     Mouth: Mucous membranes are moist.     Pharynx: Oropharynx is clear. No oropharyngeal exudate or posterior oropharyngeal erythema.  Eyes:     General: No scleral icterus.    Extraocular Movements: Extraocular movements intact.     Conjunctiva/sclera: Conjunctivae normal.     Pupils: Pupils are equal, round, and reactive to light.  Neck:     Thyroid: No thyroid mass or thyromegaly.  Cardiovascular:     Rate and Rhythm: Normal rate and regular rhythm.     Pulses: Normal pulses.          Radial pulses are 2+ on the right side and 2+ on the left side.     Heart sounds: Normal heart sounds. No murmur heard. Pulmonary:     Effort: Pulmonary effort is normal. No respiratory distress.     Breath sounds: Normal breath sounds. No wheezing, rhonchi or rales.  Abdominal:     General: Bowel sounds are normal. There is no distension.     Palpations: Abdomen is soft. There is no mass.     Tenderness: There is no abdominal tenderness. There is no guarding or rebound.     Hernia: No hernia is present.  Musculoskeletal:        General: Normal range of motion.     Cervical back: Normal range of motion and neck supple.     Right lower leg: No edema.     Left lower leg: No edema.  Lymphadenopathy:     Cervical: No cervical adenopathy.  Skin:    General: Skin is warm and dry.     Findings: No rash.  Neurological:     General: No focal deficit present.     Mental Status: He is  alert and oriented to person, place, and time.  Psychiatric:        Mood and Affect: Mood normal.        Behavior: Behavior normal.        Thought Content: Thought content normal.        Judgment: Judgment normal.       Results for orders placed or performed in visit on 09/02/22  Hepatitis  C antibody  Result Value Ref Range   Hepatitis C Ab NON-REACTIVE NON-REACTIVE  VITAMIN D 25 Hydroxy (Vit-D Deficiency, Fractures)  Result Value Ref Range   VITD 23.21 (L) 30.00 - 100.00 ng/mL  Uric acid  Result Value Ref Range   Uric Acid, Serum 4.6 4.0 - 7.8 mg/dL  Comprehensive metabolic panel  Result Value Ref Range   Sodium 141 135 - 145 mEq/L   Potassium 4.1 3.5 - 5.1 mEq/L   Chloride 102 96 - 112 mEq/L   CO2 29 19 - 32 mEq/L   Glucose, Bld 164 (H) 70 - 99 mg/dL   BUN 14 6 - 23 mg/dL   Creatinine, Ser 1.190.67 0.40 - 1.50 mg/dL   Total Bilirubin 0.9 0.2 - 1.2 mg/dL   Alkaline Phosphatase 45 39 - 117 U/L   AST 30 0 - 37 U/L   ALT 53 0 - 53 U/L   Total Protein 7.4 6.0 - 8.3 g/dL   Albumin 4.9 3.5 - 5.2 g/dL   GFR 147.82111.93 >95.62>60.00 mL/min   Calcium 9.9 8.4 - 10.5 mg/dL  Lipid panel  Result Value Ref Range   Cholesterol 135 0 - 200 mg/dL   Triglycerides 130.8308.0 (H) 0.0 - 149.0 mg/dL   HDL 65.7833.80 (L) >46.96>39.00 mg/dL   VLDL 29.561.6 (H) 0.0 - 28.440.0 mg/dL   Total CHOL/HDL Ratio 4    NonHDL 100.87   LDL cholesterol, direct  Result Value Ref Range   Direct LDL 67.0 mg/dL   Lab Results  Component Value Date   PSA 0.36 08/14/2021   Assessment & Plan:   Problem List Items Addressed This Visit     Health maintenance examination - Primary (Chronic)    Preventative protocols reviewed and updated unless pt declined. Discussed healthy diet and lifestyle.       Gout    Continue allopurinol 300mg  daily. Recurrent gout flare when he stopped allopurinol.       Relevant Medications   allopurinol (ZYLOPRIM) 300 MG tablet   Essential hypertension    Chronic, stable. Continue current regimen.        Relevant Medications   amLODipine (NORVASC) 10 MG tablet   atorvastatin (LIPITOR) 80 MG tablet   losartan (COZAAR) 100 MG tablet   GERD    Stable period off PPI      IBS   Obesity, Class I, BMI 30.0-34.9 (see actual BMI)    Continue to encourage healthy diet and lifestyle choices to affect sustainable weight loss.       Type 2 diabetes mellitus with other specified complication (HCC)   Relevant Medications   atorvastatin (LIPITOR) 80 MG tablet   losartan (COZAAR) 100 MG tablet   Dyslipidemia associated with type 2 diabetes mellitus (HCC)    Chronic, stable on high dose atorvastatin - continue. The 10-year ASCVD risk score (Arnett DK, et al., 2019) is: 4.4%   Values used to calculate the score:     Age: [age] years     Sex: Male     Is Non-Hispanic African American: No     Diabetic: Yes     Tobacco smoker: No     Systolic Blood Pressure: 130 mmHg     Is BP treated: Yes     HDL Cholesterol: 33.8 mg/dL     Total Cholesterol: 135 mg/dL       Relevant Medications   atorvastatin (LIPITOR) 80 MG tablet   losartan (COZAAR) 100 MG tablet   Vitamin D deficiency    States  unable to maintain levels >30 despite taking up to 4000 IU daily.  Will Rx weekly 50k dose.       Adjustment disorder with mixed anxiety and depressed mood    Stable period off medication      CAD (coronary artery disease)    Nonobstructive CAD appreciate cardiology care.       Relevant Medications   amLODipine (NORVASC) 10 MG tablet   atorvastatin (LIPITOR) 80 MG tablet   losartan (COZAAR) 100 MG tablet   Hypertriglyceridemia    Anticipate driven by hyperglycemia - reassess with better sugar control. Continue high potency statin.       Relevant Medications   amLODipine (NORVASC) 10 MG tablet   atorvastatin (LIPITOR) 80 MG tablet   losartan (COZAAR) 100 MG tablet   Type 2 diabetes mellitus with hyperglycemia, without long-term current use of insulin (HCC)    Now sees endo on jardiance, glipizide,  metformin. Appreciate endo care.       Relevant Medications   atorvastatin (LIPITOR) 80 MG tablet   losartan (COZAAR) 100 MG tablet   Other Visit Diagnoses     Need for influenza vaccination       Relevant Orders   Flu Vaccine QUAD 64mo+IM (Fluarix, Fluzone & Alfiuria Quad PF) (Completed)   Special screening for malignant neoplasms, colon       Relevant Orders   Ambulatory referral to Gastroenterology        Meds ordered this encounter  Medications   amLODipine (NORVASC) 10 MG tablet    Sig: TAKE ONE TABLET DAILY.    Dispense:  90 tablet    Refill:  3   traZODone (DESYREL) 50 MG tablet    Sig: Take 0.5-1 tablets (25-50 mg total) by mouth at bedtime as needed for sleep.    Dispense:  90 tablet    Refill:  3   atorvastatin (LIPITOR) 80 MG tablet    Sig: Take 1 tablet (80 mg total) by mouth daily.    Dispense:  90 tablet    Refill:  3   allopurinol (ZYLOPRIM) 300 MG tablet    Sig: Take 1 tablet (300 mg total) by mouth daily.    Dispense:  90 tablet    Refill:  3   losartan (COZAAR) 100 MG tablet    Sig: TAKE 1 TABLET BY MOUTH DAILY    Dispense:  90 tablet    Refill:  3   Cholecalciferol (VITAMIN D3) 1.25 MG (50000 UT) TABS    Sig: Take 1 tablet by mouth once a week.    Dispense:  12 tablet    Refill:  3   Orders Placed This Encounter  Procedures   Flu Vaccine QUAD 67mo+IM (Fluarix, Fluzone & Alfiuria Quad PF)   Ambulatory referral to Gastroenterology    Referral Priority:   Routine    Referral Type:   Consultation    Referral Reason:   Specialty Services Required    Number of Visits Requested:   1     Patient instructions: We will request latest diabetic eye from [facility] doctor in [place], on [address].  Flu shot today  Medicines refilled.  Give GI a call at your convenience to discuss colonoscopy at [phone] option 1  Start weekly vitamin D 50k units - prescription sent to pharmacy.  Consider CoEnzyme Q10 supplement daily.  Good to see you  today  Return as needed or in 1 year for next physical.   Follow up plan: Return in about 1  year (around 09/05/2023) for annual exam, prior fasting for blood work.  Eustaquio Boyden, MD

## 2022-09-04 NOTE — Assessment & Plan Note (Signed)
Stable period off PPI 

## 2022-09-04 NOTE — Assessment & Plan Note (Signed)
Nonobstructive CAD appreciate cardiology care.

## 2022-09-05 ENCOUNTER — Other Ambulatory Visit: Payer: Self-pay | Admitting: Family Medicine

## 2022-09-05 LAB — MICROALBUMIN / CREATININE URINE RATIO
Creatinine,U: 38.6 mg/dL
Microalb Creat Ratio: 15.2 mg/g (ref 0.0–30.0)
Microalb, Ur: 5.9 mg/dL — ABNORMAL HIGH (ref 0.0–1.9)

## 2022-09-05 NOTE — Telephone Encounter (Signed)
Rx sent 09/04/22, #90/3.

## 2022-09-17 ENCOUNTER — Encounter: Payer: Self-pay | Admitting: Optometry

## 2022-10-16 ENCOUNTER — Other Ambulatory Visit (HOSPITAL_COMMUNITY): Payer: Self-pay

## 2022-10-16 ENCOUNTER — Telehealth: Payer: Self-pay

## 2022-10-16 NOTE — Telephone Encounter (Addendum)
Patient advised and will pick up a sample of Tresiba until we find out what is covered by insurance

## 2022-10-16 NOTE — Telephone Encounter (Signed)
Kyle [name], and Kyle [name]  are covered which one would you like to send for patient. He currently has a sample of the [place] U200.

## 2022-10-16 NOTE — Telephone Encounter (Signed)
High BS   10/15/22   7am- 222-fasting    5pm 346  Patient had a protein shake   6pm  306   8pm 206   10pm 150     10/16/22  7am- 268- fasting   Patient had some eggs for breakfast   10am-255   Noon-194    Patient states that he has been taking the Jardiance, Glipizide, and Metformin as prescribed.  Patient will be heading out of town and states that he is concern about high numbers.

## 2022-10-16 NOTE — Telephone Encounter (Signed)
This sugars are quite high.  It looks like we need to restart insulin.  From the chart, I see he was on Levemir before.  They are not making this anymore so I would suggest trying Lantus or any other long-acting insulin, depending on insurance preference. Start at 14 units at bedtime and increase the dose by 4 units every 2 days as needed for blood sugars in the morning lower than 130.  Let's send a box of pens with 2 refills and also pen needles G32, 4 mm.  Please call us back when he returns to town to see how he is doing.  Continue the rest of the regimen otherwise.

## 2022-10-16 NOTE — Telephone Encounter (Signed)
Patient picked up sample of Tresiba in office today.

## 2022-10-16 NOTE — Telephone Encounter (Signed)
Left vm for patient to call back.

## 2022-11-04 ENCOUNTER — Ambulatory Visit (AMBULATORY_SURGERY_CENTER): Payer: Managed Care, Other (non HMO) | Admitting: *Deleted

## 2022-11-04 VITALS — Ht 74.0 in | Wt 250.0 lb

## 2022-11-04 DIAGNOSIS — Z1211 Encounter for screening for malignant neoplasm of colon: Secondary | ICD-10-CM

## 2022-11-04 MED ORDER — NA SULFATE-K SULFATE-MG SULF 17.5-3.13-1.6 GM/177ML PO SOLN: 1.0000 | Freq: Once | ORAL | 0 refills | Status: AC

## 2022-11-04 NOTE — Progress Notes (Signed)

## 2022-11-05 ENCOUNTER — Telehealth: Payer: Self-pay

## 2022-11-05 MED ORDER — TRESIBA FLEXTOUCH 100 UNIT/ML ~~LOC~~ SOPN: 14.0000 [IU] | PEN_INJECTOR | Freq: Every day | SUBCUTANEOUS | 3 refills | Status: DC

## 2022-11-05 MED ORDER — INSULIN PEN NEEDLE 32G X 4 MM MISC: 1.0000 | Freq: Every day | 3 refills | Status: DC

## 2022-11-05 NOTE — Telephone Encounter (Signed)
Patient states that the Tyler Aas has been controlling his sugars. Would like to have a script sent in. Patient was given a sample of the U200.

## 2022-11-05 NOTE — Telephone Encounter (Signed)
Patient notified and will pick up prescription.

## 2022-11-13 ENCOUNTER — Other Ambulatory Visit: Payer: Self-pay | Admitting: Family Medicine

## 2022-11-13 DIAGNOSIS — I10 Essential (primary) hypertension: Secondary | ICD-10-CM

## 2022-11-19 ENCOUNTER — Encounter: Payer: Self-pay | Admitting: Gastroenterology

## 2022-11-22 ENCOUNTER — Telehealth: Payer: Self-pay | Admitting: Gastroenterology

## 2022-11-22 NOTE — Telephone Encounter (Signed)
Patient called states he is positive for Covid-19 would need to cancel.

## 2022-11-25 ENCOUNTER — Encounter: Payer: Managed Care, Other (non HMO) | Admitting: Gastroenterology

## 2022-11-27 ENCOUNTER — Other Ambulatory Visit: Payer: Self-pay | Admitting: Family Medicine

## 2022-11-28 ENCOUNTER — Other Ambulatory Visit: Payer: Self-pay | Admitting: Family Medicine

## 2022-11-28 DIAGNOSIS — I10 Essential (primary) hypertension: Secondary | ICD-10-CM

## 2023-02-11 NOTE — Progress Notes (Unsigned)
Name: Kyle [name]  MRN/ DOB: [id number], 12/11/1975   Age/ Sex: 47 y.o., male    PCP: Eustaquio Boyden, MD   Reason for Endocrinology Evaluation: Type 2 Diabetes Mellitus     Date of Initial Endocrinology Visit: 04/02/2022    PATIENT IDENTIFIER: [name] is a 47 y.o. male with a past medical history of T2DM, Dyslipidemia, CAD and OSA. The patient presented for initial endocrinology clinic visit on 04/02/2022 for consultative assistance with his diabetes management.    HPI: [name] was    Diagnosed with DM in 2018 Prior Medications tried/Intolerance: Intolerant to higher doses of metformin . Was on insulin at some point.            Hemoglobin A1c has ranged from 6.8% in 2018, peaking at 11.5% in 2022.  No prior dx of pancreatitis  Has chronic GI issues with alternating bowel movement    On his initial visit to our clinic his A1c was 9.1%, he was on Jardiance and metformin which we continued and added glipizide  He was started on insulin by December 2023 due to persistent hyperglycemia    SUBJECTIVE:   During the last visit (08/06/2022): A1C 10.2%  Today (02/12/23): [name] is here for follow-up on diabetes management.  He checks his blood sugars 1 times a day.  The patient has not  had hypoglycemic episodes since the last clinic visit   Girlfriend had bowel obstruction with Ozempic   Denies nausea, or vomiting  but has chronic diarrhea since childhood   HOME DIABETES REGIMEN: Jardiance 25 mg daily  Metformin 500 mg 2 tabs daily  Glipizide 10 mg twice daily - takes it once a day  Tresiba 14 units daily     Statin: yes ACE-I/ARB: yes    METER DOWNLOAD SUMMARY: 4/4-4/17/2024  Average Number Tests/Day = 1 Overall Mean FS Glucose = 165 Standard Deviation = 29  BG Ranges: Low = 102 High = 206   Hypoglycemic Events/30 Days: BG < 50 = 0 Episodes of symptomatic severe hypoglycemia = 0   DIABETIC COMPLICATIONS: Microvascular complications:    Denies: CKD, neuropathy, retinopathy  Last eye exam: Completed 2023  Macrovascular complications:  CAD Denies:  PVD, CVA   PAST HISTORY: Past Medical History:  Past Medical History:  Diagnosis Date   COVID-19 11/09/2019   Diabetes type 2, controlled    Dr Tedd Sias   Dyslipidemia 05/25/2014   Family history of adverse reaction to anesthesia    Mother has postoperatve N&V   GERD 05/11/2007   Gout, unspecified 09/05/2009   History of kidney stones    h/o kidney stones "regularly over the past 5 years"   HTN (hypertension) 09/08/2008   IBS 01/08/2008   SLEEP APNEA 01/08/2008   cannot tolerate CPAP   Sleep apnea    Past Surgical History:  Past Surgical History:  Procedure Laterality Date   COLONOSCOPY     HERNIA REPAIR     NASAL SEPTOPLASTY W/ TURBINOPLASTY Bilateral 08/28/2018   Procedure: NASAL SEPTOPLASTY WITH TURBINATE REDUCTION;  Surgeon: [name], MD;  Location: [facility] OR;  Service: ENT;  Laterality: Bilateral;   UPPER GASTROINTESTINAL ENDOSCOPY      Social History:  reports that he quit smoking about 19 years ago. His smoking use included cigarettes. He quit smokeless tobacco use about 19 years ago. He reports current alcohol use. He reports that he does not use drugs. Family History:  Family History  Problem Relation Age of Onset   CAD  Mother 23   Cancer Father 39       prostate s/p radiation seed implant   CAD Father 94   Hypothyroidism Father 77   Cancer Sister 48       cervical   Colon cancer Maternal Uncle    Cancer Maternal Uncle        colon   Stroke Other        bilateral grandfathers   Hypertension Other        strong on maternal side   CAD Other        bilateral grandfathers   Diabetes Neg Hx    Colon polyps Neg Hx    Crohn's disease Neg Hx    Esophageal cancer Neg Hx    Rectal cancer Neg Hx    Stomach cancer Neg Hx    Ulcerative colitis Neg Hx      HOME MEDICATIONS: Allergies as of 02/12/2023       Reactions   Penicillins Swelling,  Other (See Comments)   SWELLING REACTION UNSPECIFIED  Has patient had a PCN reaction causing immediate rash, facial/tongue/throat swelling, SOB or lightheadedness with hypotension: No Has patient had a PCN reaction causing severe rash involving mucus membranes or skin necrosis: No Has patient had a PCN reaction that required hospitalization: No Has patient had a PCN reaction occurring within the last 10 years: No If all of the above answers are "NO", then may proceed with Cephalosporin use.        Medication List        Accurate as of February 12, 2023  7:55 AM. If you have any questions, ask your nurse or doctor.          STOP taking these medications    metFORMIN 500 MG tablet Commonly known as: GLUCOPHAGE Replaced by: metFORMIN 500 MG 24 hr tablet Stopped by: Scarlette Shorts, MD       TAKE these medications    allopurinol 300 MG tablet Commonly known as: ZYLOPRIM Take 1 tablet (300 mg total) by mouth daily.   amLODipine 10 MG tablet Commonly known as: NORVASC TAKE 1 TABLET BY MOUTH DAILY   atorvastatin 80 MG tablet Commonly known as: LIPITOR Take 1 tablet (80 mg total) by mouth daily.   empagliflozin 25 MG Tabs tablet Commonly known as: JARDIANCE Take 1 tablet (25 mg total) by mouth daily.   glipiZIDE 10 MG tablet Commonly known as: GLUCOTROL Take 1 tablet (10 mg total) by mouth daily before breakfast.   Insulin Pen Needle 32G X 4 MM Misc 1 Device by Does not apply route daily in the afternoon.   losartan 100 MG tablet Commonly known as: COZAAR TAKE 1 TABLET BY MOUTH DAILY   metFORMIN 500 MG 24 hr tablet Commonly known as: GLUCOPHAGE-XR Take 2 tablets (1,000 mg total) by mouth daily with breakfast. Replaces: metFORMIN 500 MG tablet Started by: Scarlette Shorts, MD   traZODone 50 MG tablet Commonly known as: DESYREL TAKE 1/2 TO 1 TABLET(25 TO 50 MG) BY MOUTH AT BEDTIME AS NEEDED FOR SLEEP   Tresiba FlexTouch 100 UNIT/ML FlexTouch  Pen Generic drug: insulin degludec Inject 17 Units into the skin daily. What changed: how much to take Changed by: Scarlette Shorts, MD   Vitamin D3 1.25 MG (50000 UT) Tabs Take 1 tablet by mouth once a week.         ALLERGIES: Allergies  Allergen Reactions   Penicillins Swelling and Other (See Comments)    SWELLING REACTION  UNSPECIFIED  Has patient had a PCN reaction causing immediate rash, facial/tongue/throat swelling, SOB or lightheadedness with hypotension: No Has patient had a PCN reaction causing severe rash involving mucus membranes or skin necrosis: No Has patient had a PCN reaction that required hospitalization: No Has patient had a PCN reaction occurring within the last 10 years: No If all of the above answers are "NO", then may proceed with Cephalosporin use.         OBJECTIVE:   VITAL SIGNS: BP 124/82 (BP Location: Left Arm, Patient Position: Sitting, Cuff Size: Large)   Pulse 74   Ht 6\' 2"  (1.88 m)   Wt 256 lb (116.1 kg)   SpO2 97%   BMI 32.87 kg/m    PHYSICAL EXAM:  General: Pt appears well and is in NAD  Lungs: Clear with good BS bilat with no rales, rhonchi, or wheezes  Heart: RRR   Abdomen: Normoactive bowel sounds, soft, nontender, without masses or organomegaly palpable  Extremities:  Lower extremities - No pretibial edema. No lesions.  Neuro: MS is good with appropriate affect, pt is alert and Ox3    DM foot exam: 02/12/2023  The skin of the feet is intact without sores or ulcerations. The pedal pulses are 2+ on right and 2+ on left. The sensation is intact to a screening 5.07, 10 gram monofilament bilaterally   DATA REVIEWED:  Lab Results  Component Value Date   HGBA1C 8.0 (A) 02/12/2023   HGBA1C 10.2 (A) 08/06/2022   HGBA1C 9.1 (A) 04/02/2022   ****     ASSESSMENT / PLAN / RECOMMENDATIONS:   1) Type 2 Diabetes Mellitus, Poorly controlled, With Macrovascular  complications - Most recent A1c of 8.0 %. Goal A1c < 7.0 %.     - Praised the pt on improved glycemic control - He is unable to take the 2nd dose of Glipizide in the evening due to busy schedule but takes it in the morning  - I have offered GLP-1 agonist but his girlfriend ended up with bowel obstruction while on Ozempic and would like to avoid them - I will change metformin to extended release  - Will increase Evaristo Bury s below    MEDICATIONS: Continue  glipizide 10 mg , 1 tablet daily  Continue metformin 500 mg, 2 tabs daily Continue  Jardiance 25 mg daily Increase Tresiba 17 units daliy   EDUCATION / INSTRUCTIONS: BG monitoring instructions: Patient is instructed to check his blood sugars 1 times a day, fasting . Call [facility] if: BG persistently < 70  I reviewed the Rule of 15 for the treatment of hypoglycemia in detail with the patient. Literature supplied.   2) Diabetic complications:  Eye: Does not have known diabetic retinopathy.  Neuro/ Feet: Does not have known diabetic peripheral neuropathy. Renal: Patient does not have known baseline CKD. He is  on an ACEI/ARB at present.  3) Hypertriglyceridemia :   - We had discussed risk of pancreatitis in the past -LDL at goal  Medication Continue atorvastatin 80 mg daily      Signed electronically by: Lyndle Herrlich, MD  [facility]  [facility] [address] [place] [place], [place] [address] Phone: [phone] FAX: [phone]   CC: Eustaquio Boyden, MD [address] [place] [address] Phone: [phone]  Fax: [phone]    Return to Endocrinology clinic as below: Future Appointments  Date Time Provider Department Center  09/01/2023  7:30 AM [facility]  09/08/2023  2:30 PM Eustaquio Boyden, MD [facility]

## 2023-02-12 ENCOUNTER — Ambulatory Visit: Payer: Managed Care, Other (non HMO) | Admitting: Internal Medicine

## 2023-02-12 ENCOUNTER — Encounter: Payer: Self-pay | Admitting: Internal Medicine

## 2023-02-12 VITALS — BP 124/82 | HR 74 | Ht 74.0 in | Wt 256.0 lb

## 2023-02-12 DIAGNOSIS — E781 Pure hyperglyceridemia: Secondary | ICD-10-CM

## 2023-02-12 DIAGNOSIS — E1165 Type 2 diabetes mellitus with hyperglycemia: Secondary | ICD-10-CM | POA: Diagnosis not present

## 2023-02-12 LAB — COMPREHENSIVE METABOLIC PANEL
ALT: 38 U/L (ref 0–53)
AST: 23 U/L (ref 0–37)
Albumin: 4.7 g/dL (ref 3.5–5.2)
Alkaline Phosphatase: 40 U/L (ref 39–117)
BUN: 19 mg/dL (ref 6–23)
CO2: 27 mEq/L (ref 19–32)
Calcium: 9.4 mg/dL (ref 8.4–10.5)
Chloride: 102 mEq/L (ref 96–112)
Creatinine, Ser: 0.75 mg/dL (ref 0.40–1.50)
GFR: 107.84 mL/min (ref >60.00)
Glucose, Bld: 146 mg/dL — ABNORMAL HIGH (ref 70–99)
Potassium: 4 mEq/L (ref 3.5–5.1)
Sodium: 138 mEq/L (ref 135–145)
Total Bilirubin: 0.6 mg/dL (ref 0.2–1.2)
Total Protein: 7.1 g/dL (ref 6.0–8.3)

## 2023-02-12 LAB — LIPID PANEL
Cholesterol: 117 mg/dL (ref 0–200)
HDL: 29.1 mg/dL — ABNORMAL LOW (ref >39.00)
NonHDL: 87.88
Total CHOL/HDL Ratio: 4
Triglycerides: 338 mg/dL — ABNORMAL HIGH (ref 0.0–149.0)
VLDL: 67.6 mg/dL — ABNORMAL HIGH (ref 0.0–40.0)

## 2023-02-12 LAB — POCT GLYCOSYLATED HEMOGLOBIN (HGB A1C): Hemoglobin A1C: 8 % — AB (ref 4.0–5.6)

## 2023-02-12 LAB — LDL CHOLESTEROL, DIRECT: Direct LDL: 54 mg/dL

## 2023-02-12 MED ORDER — TRESIBA FLEXTOUCH 100 UNIT/ML ~~LOC~~ SOPN
17.0000 [IU] | PEN_INJECTOR | Freq: Every day | SUBCUTANEOUS | 4 refills | Status: DC
Start: 2023-02-12 — End: 2023-08-20

## 2023-02-12 MED ORDER — METFORMIN HCL ER 500 MG PO TB24
1000.0000 mg | ORAL_TABLET | Freq: Every day | ORAL | 3 refills | Status: DC
Start: 2023-02-12 — End: 2023-08-20

## 2023-02-12 MED ORDER — INSULIN PEN NEEDLE 32G X 4 MM MISC: 1.0000 | Freq: Every day | 3 refills | Status: DC

## 2023-02-12 MED ORDER — EMPAGLIFLOZIN 25 MG PO TABS: 25.0000 mg | ORAL_TABLET | Freq: Every day | ORAL | 3 refills | Status: DC

## 2023-02-12 MED ORDER — GLIPIZIDE 10 MG PO TABS: 10.0000 mg | ORAL_TABLET | Freq: Every day | ORAL | 3 refills | Status: DC

## 2023-02-12 NOTE — Patient Instructions (Addendum)
-   Continue Glipizide 10 mg, 1 tablet before first meal of the day   - Continue  Jardiance 25 mg daily - Continue Metformin 500 mg, two tablets daily  - Increase Tresiba 17 units daily      HOW TO TREAT LOW BLOOD SUGARS (Blood sugar LESS THAN 70 MG/DL) Please follow the RULE OF 15 for the treatment of hypoglycemia treatment (when your (blood sugars are less than 70 mg/dL)   STEP 1: Take 15 grams of carbohydrates when your blood sugar is low, which includes:  3-4 GLUCOSE TABS  OR 3-4 OZ OF JUICE OR REGULAR SODA OR ONE TUBE OF GLUCOSE GEL    STEP 2: RECHECK blood sugar in 15 MINUTES STEP 3: If your blood sugar is still low at the 15 minute recheck --> then, go back to STEP 1 and treat AGAIN with another 15 grams of carbohydrates.

## 2023-02-13 MED ORDER — FENOFIBRATE 145 MG PO TABS
145.0000 mg | ORAL_TABLET | Freq: Every day | ORAL | 3 refills | Status: DC
Start: 2023-02-13 — End: 2023-08-20

## 2023-02-19 ENCOUNTER — Ambulatory Visit: Payer: Managed Care, Other (non HMO) | Admitting: Family Medicine

## 2023-02-19 ENCOUNTER — Encounter: Payer: Self-pay | Admitting: Family Medicine

## 2023-02-19 VITALS — BP 140/90 | HR 88 | Temp 97.8°F | Ht 74.0 in | Wt 261.0 lb

## 2023-02-19 DIAGNOSIS — M7711 Lateral epicondylitis, right elbow: Secondary | ICD-10-CM

## 2023-02-19 MED ORDER — TRIAMCINOLONE ACETONIDE 40 MG/ML IJ SUSP
20.0000 mg | Freq: Once | INTRAMUSCULAR | Status: AC
Start: 2023-02-19 — End: 2023-02-19
  Administered 2023-02-19: 20 mg via INTRA_ARTICULAR

## 2023-02-19 NOTE — Progress Notes (Signed)
Maleiyah Releford T. Preet Mangano, MD, CAQ Sports Medicine [facility] [address] [place], [address]  Phone: [phone]  FAX: [phone]  Kyle [name] - 47 y.o. male  MRN [id number]  Date of Birth: [birth year]  Date: 02/19/2023  PCP: Eustaquio Boyden, MD  Referral: Eustaquio Boyden, MD  Chief Complaint  Patient presents with   Elbow Pain    Right    Subjective:   Kyle [name] presents with lateral elbow pain.  Length of symptoms: 39mo Hand effected: R  Patient describes a dull ache on the lateral elbow. There is some translation in the proximal forearm and in the distal upper arm. It is painful to lift with the hand facing down and to lift with the thumb in an upright position. Supination is painful. Patient points to the lateral epicondyle as the point of maximal tenderness near ECRB.  No trauma.   KT tape, compression Motrin 8 weeks   Cannot lift a coffee mug.  Trouble lifting a phone.   Using computer, no other repetitive actions.   Trying to do some ROM - will do that a couple of times a day.  Hurts a lot ot night.  Lays elbow on his pillow.   Taking a lot of Ibuprofen.   No prior fractures or operative interventions in the effective hand. Prior PT or HEP: none  Denies numbness or tingling. No significant neck or shoulder pain.  Hand of dominance: RHD   Review of Systems is noted in the HPI, as appropriate  Objective:   Blood pressure (!) 140/90, pulse 88, temperature 97.8 F (36.6 C), temperature source Temporal, height  (1.88 m), weight 261 lb (118.4 kg), SpO2 94 %.  GEN: No acute distress; alert,appropriate. PULM: Breathing comfortably in no respiratory distress PSYCH: Normally interactive.   Elbow: R Ecchymosis or edema: neg ROM: full flexion, extension, pronation, supination Shoulder ROM: Full Flexion: 5/5 Extension: 5/5, PAINFUL Supination: 5/5, PAINFUL Pronation: 5/5 Wrist ext: 5/5 Wrist  flexion: 5/5 No gross bony abnormality Varus and Valgus stress: stable ECRB tenderness: YES, TTP Medial epicondyle: NT Lateral epicondyle, resisted wrist extension from wrist full pronation and flexion: PAINFUL grip: 5/5  sensation intact Tinel's, Elbow: negative  Subjective:     ICD-10-CM   1. Lateral epicondylitis of right elbow  M77.11 triamcinolone acetonide (KENALOG-40) injection 20 mg     Elbow anatomy was reviewed, and tendinopathy was explained.  Pt. given a formal rehab program from [facility] on elbow rehabiliation.  Start off with isometrics and gentle stretching and ROM progressing to a series of concentric and eccentric exercises should be done starting with no weight, work up to 1 lb, hammer, etc.  Use counterforce strap if working or using hands.  Emphasized stretching an cross-friction massage Emphasized proper palms up lifting biomechanics to unload ECRB  Tendon Origin / Insertion Injection Procedure Note Kyle [name] 07/10/1976 Date of procedure: 02/19/2023  Procedure: Tendon Origin / Insertion Injection for Lateral Epicondylitis, R Indications: Pain  Procedure Details Verbal consent was obtained from the patient. Risks, benefits, and alternatives were discussed. Potential complications including loss of pigment, atrophy were discussed. Prepped with Chloraprep and Ethyl Chloride used for anesthesia. Under sterile conditions, the patient was injected at the point of maximal tenderness at the ECRB tendon with 1/2 cc of Lidocaine 1% and 1/2 cc of Kenalog 40 mg. Decreased pain after injection. No complications.  Needle size: 22 gauge 1 1/2 inch Medication: 1/2 cc of Kenalog  40 mg (equaling Kenalog 20 mg)   Follow-up: No follow-ups on file.  Meds ordered this encounter  Medications   triamcinolone acetonide (KENALOG-40) injection 20 mg   No orders of the defined types were placed in this encounter.   Signed,  Elpidio Galea. Airiel Oblinger, MD   Patient's  Medications  New Prescriptions   No medications on file  Previous Medications   ALLOPURINOL (ZYLOPRIM) 300 MG TABLET    Take 1 tablet (300 mg total) by mouth daily.   AMLODIPINE (NORVASC) 10 MG TABLET    TAKE 1 TABLET BY MOUTH DAILY   ATORVASTATIN (LIPITOR) 80 MG TABLET    Take 1 tablet (80 mg total) by mouth daily.   CHOLECALCIFEROL (VITAMIN D3) 1.25 MG (50000 UT) TABS    Take 1 tablet by mouth once a week.   EMPAGLIFLOZIN (JARDIANCE) 25 MG TABS TABLET    Take 1 tablet (25 mg total) by mouth daily.   FENOFIBRATE (TRICOR) 145 MG TABLET    Take 1 tablet (145 mg total) by mouth daily.   GLIPIZIDE (GLUCOTROL) 10 MG TABLET    Take 1 tablet (10 mg total) by mouth daily before breakfast.   INSULIN DEGLUDEC (TRESIBA FLEXTOUCH) 100 UNIT/ML FLEXTOUCH PEN    Inject 17 Units into the skin daily.   INSULIN PEN NEEDLE 32G X 4 MM MISC    1 Device by Does not apply route daily in the afternoon.   LOSARTAN (COZAAR) 100 MG TABLET    TAKE 1 TABLET BY MOUTH DAILY   METFORMIN (GLUCOPHAGE-XR) 500 MG 24 HR TABLET    Take 2 tablets (1,000 mg total) by mouth daily with breakfast.   TRAZODONE (DESYREL) 50 MG TABLET    TAKE 1/2 TO 1 TABLET(25 TO 50 MG) BY MOUTH AT BEDTIME AS NEEDED FOR SLEEP  Modified Medications   No medications on file  Discontinued Medications   No medications on file

## 2023-05-06 ENCOUNTER — Other Ambulatory Visit: Payer: Self-pay | Admitting: Internal Medicine

## 2023-05-06 DIAGNOSIS — E1165 Type 2 diabetes mellitus with hyperglycemia: Secondary | ICD-10-CM

## 2023-06-19 ENCOUNTER — Other Ambulatory Visit: Payer: Self-pay | Admitting: Internal Medicine

## 2023-08-20 ENCOUNTER — Ambulatory Visit: Payer: Managed Care, Other (non HMO) | Admitting: Internal Medicine

## 2023-08-20 ENCOUNTER — Encounter: Payer: Self-pay | Admitting: Internal Medicine

## 2023-08-20 VITALS — BP 132/82 | HR 90 | Ht 74.0 in | Wt 251.0 lb

## 2023-08-20 DIAGNOSIS — Z23 Encounter for immunization: Secondary | ICD-10-CM

## 2023-08-20 DIAGNOSIS — Z7984 Long term (current) use of oral hypoglycemic drugs: Secondary | ICD-10-CM

## 2023-08-20 DIAGNOSIS — E1165 Type 2 diabetes mellitus with hyperglycemia: Secondary | ICD-10-CM | POA: Diagnosis not present

## 2023-08-20 DIAGNOSIS — Z794 Long term (current) use of insulin: Secondary | ICD-10-CM | POA: Diagnosis not present

## 2023-08-20 DIAGNOSIS — E781 Pure hyperglyceridemia: Secondary | ICD-10-CM | POA: Diagnosis not present

## 2023-08-20 LAB — LIPID PANEL
Cholesterol: 143 mg/dL (ref 0–200)
HDL: 31.4 mg/dL — ABNORMAL LOW (ref >39.00)
LDL Cholesterol: 63 mg/dL (ref 0–99)
NonHDL: 111.21
Total CHOL/HDL Ratio: 5
Triglycerides: 241 mg/dL — ABNORMAL HIGH (ref 0.0–149.0)
VLDL: 48.2 mg/dL — ABNORMAL HIGH (ref 0.0–40.0)

## 2023-08-20 LAB — COMPREHENSIVE METABOLIC PANEL
ALT: 41 U/L (ref 0–53)
AST: 24 U/L (ref 0–37)
Albumin: 4.7 g/dL (ref 3.5–5.2)
Alkaline Phosphatase: 45 U/L (ref 39–117)
BUN: 16 mg/dL (ref 6–23)
CO2: 27 meq/L (ref 19–32)
Calcium: 9.8 mg/dL (ref 8.4–10.5)
Chloride: 102 meq/L (ref 96–112)
Creatinine, Ser: 0.7 mg/dL (ref 0.40–1.50)
GFR: 109.71 mL/min (ref >60.00)
Glucose, Bld: 142 mg/dL — ABNORMAL HIGH (ref 70–99)
Potassium: 3.8 meq/L (ref 3.5–5.1)
Sodium: 138 meq/L (ref 135–145)
Total Bilirubin: 0.6 mg/dL (ref 0.2–1.2)
Total Protein: 7.4 g/dL (ref 6.0–8.3)

## 2023-08-20 LAB — POCT GLYCOSYLATED HEMOGLOBIN (HGB A1C): Hemoglobin A1C: 8.4 % — AB (ref 4.0–5.6)

## 2023-08-20 LAB — MICROALBUMIN / CREATININE URINE RATIO
Creatinine,U: 57.8 mg/dL
Microalb Creat Ratio: 12.3 mg/g (ref 0.0–30.0)
Microalb, Ur: 7.1 mg/dL — ABNORMAL HIGH (ref 0.0–1.9)

## 2023-08-20 LAB — POCT GLUCOSE (DEVICE FOR HOME USE): POC Glucose: 173 mg/dL — AB (ref 70–99)

## 2023-08-20 MED ORDER — FENOFIBRATE 145 MG PO TABS: 145.0000 mg | ORAL_TABLET | Freq: Every day | ORAL | 3 refills | Status: DC

## 2023-08-20 MED ORDER — METFORMIN HCL ER 500 MG PO TB24
1000.0000 mg | ORAL_TABLET | Freq: Every day | ORAL | 3 refills | Status: DC
Start: 2023-08-20 — End: 2024-03-10

## 2023-08-20 MED ORDER — ACCU-CHEK GUIDE VI STRP: 1.0000 | ORAL_STRIP | Freq: Two times a day (BID) | 3 refills | Status: AC

## 2023-08-20 MED ORDER — ATORVASTATIN CALCIUM 80 MG PO TABS: 80.0000 mg | ORAL_TABLET | Freq: Every day | ORAL | 3 refills | Status: DC

## 2023-08-20 MED ORDER — TRESIBA FLEXTOUCH 100 UNIT/ML ~~LOC~~ SOPN
20.0000 [IU] | PEN_INJECTOR | Freq: Every day | SUBCUTANEOUS | 4 refills | Status: DC
Start: 2023-08-20 — End: 2024-05-05

## 2023-08-20 MED ORDER — INSULIN PEN NEEDLE 32G X 4 MM MISC
1.0000 | Freq: Every day | 3 refills | Status: AC
Start: 2023-08-20 — End: ?

## 2023-08-20 MED ORDER — EMPAGLIFLOZIN 25 MG PO TABS: 25.0000 mg | ORAL_TABLET | Freq: Every day | ORAL | 3 refills | Status: AC

## 2023-08-20 MED ORDER — GLIPIZIDE 10 MG PO TABS: 10.0000 mg | ORAL_TABLET | Freq: Two times a day (BID) | ORAL | 3 refills | Status: DC

## 2023-08-20 NOTE — Progress Notes (Signed)
Name: Kyle [name]  MRN/ DOB: [id number], 02/03/1976   Age/ Sex: 47 y.o., male    PCP: Eustaquio Boyden, MD   Reason for Endocrinology Evaluation: Type 2 Diabetes Mellitus     Date of Initial Endocrinology Visit: 04/02/2022    PATIENT IDENTIFIER: Kyle [name] is a 47 y.o. male with a past medical history of T2DM, Dyslipidemia, CAD and OSA. The patient presented for initial endocrinology clinic visit on 04/02/2022 for consultative assistance with his diabetes management.    HPI: Kyle [name] was    Diagnosed with DM in 2018 Prior Medications tried/Intolerance: Intolerant to higher doses of metformin . Was on insulin at some point.            Hemoglobin A1c has ranged from 6.8% in 2018, peaking at 11.5% in 2022.  No prior dx of pancreatitis  Has chronic GI issues with alternating bowel movement    On his initial visit to our clinic his A1c was 9.1%, he was on Jardiance and metformin which we continued and added glipizide  He was started on insulin by December 2023 due to persistent hyperglycemia    SUBJECTIVE:   During the last visit (02/12/2023): A1C 8.0%     Today (08/20/23): Kyle [name] is here for follow-up on diabetes management.  He checks his blood sugars occasionally.  The patient has not had hypoglycemic episodes since the last clinic visit.   Girlfriend had bowel obstruction with Ozempic   Denies nausea, or vomiting Continues with  chronic diarrhea since childhood alternating with constipation     HOME DIABETES REGIMEN: Jardiance 25 mg daily  Metformin 500 mg 2 tabs daily  Glipizide 10 mg daily  Tresiba  20 units daily Atorvastatin 80 mg daily Fenofibrate 145 mg daily    Statin: yes ACE-I/ARB: yes    METER DOWNLOAD SUMMARY: n/a   DIABETIC COMPLICATIONS: Microvascular complications:   Denies: CKD, neuropathy, retinopathy  Last eye exam: Completed 2023  Macrovascular complications:  CAD Denies:  PVD, CVA   PAST HISTORY: Past Medical  History:  Past Medical History:  Diagnosis Date   COVID-19 11/09/2019   Diabetes type 2, controlled (HCC)    Dr Tedd Sias   Dyslipidemia 05/25/2014   Family history of adverse reaction to anesthesia    Mother has postoperatve N&V   GERD 05/11/2007   Gout, unspecified 09/05/2009   History of kidney stones    h/o kidney stones "regularly over the past 5 years"   HTN (hypertension) 09/08/2008   IBS 01/08/2008   SLEEP APNEA 01/08/2008   cannot tolerate CPAP   Sleep apnea    Past Surgical History:  Past Surgical History:  Procedure Laterality Date   COLONOSCOPY     HERNIA REPAIR     NASAL SEPTOPLASTY W/ TURBINOPLASTY Bilateral 08/28/2018   Procedure: NASAL SEPTOPLASTY WITH TURBINATE REDUCTION;  Surgeon: [name], MD;  Location: [facility] OR;  Service: ENT;  Laterality: Bilateral;   UPPER GASTROINTESTINAL ENDOSCOPY      Social History:  reports that he quit smoking about 19 years ago. His smoking use included cigarettes. He quit smokeless tobacco use about 19 years ago. He reports current alcohol use. He reports that he does not use drugs. Family History:  Family History  Problem Relation Age of Onset   CAD Mother 74   Cancer Father 12       prostate s/p radiation seed implant   CAD Father 44   Hypothyroidism Father 36   Cancer Sister 77  cervical   Colon cancer Maternal Uncle    Cancer Maternal Uncle        colon   Stroke Other        bilateral grandfathers   Hypertension Other        strong on maternal side   CAD Other        bilateral grandfathers   Diabetes Neg Hx    Colon polyps Neg Hx    Crohn's disease Neg Hx    Esophageal cancer Neg Hx    Rectal cancer Neg Hx    Stomach cancer Neg Hx    Ulcerative colitis Neg Hx      HOME MEDICATIONS: Allergies as of 08/20/2023       Reactions   Penicillins Swelling, Other (See Comments)   SWELLING REACTION UNSPECIFIED  Has patient had a PCN reaction causing immediate rash, facial/tongue/throat swelling, SOB or  lightheadedness with hypotension: No Has patient had a PCN reaction causing severe rash involving mucus membranes or skin necrosis: No Has patient had a PCN reaction that required hospitalization: No Has patient had a PCN reaction occurring within the last 10 years: No If all of the above answers are "NO", then may proceed with Cephalosporin use.        Medication List        Accurate as of August 20, 2023  8:08 AM. If you have any questions, ask your nurse or doctor.          allopurinol 300 MG tablet Commonly known as: ZYLOPRIM Take 1 tablet (300 mg total) by mouth daily.   amLODipine 10 MG tablet Commonly known as: NORVASC TAKE 1 TABLET BY MOUTH DAILY   atorvastatin 80 MG tablet Commonly known as: LIPITOR Take 1 tablet (80 mg total) by mouth daily.   chlorhexidine 0.12 % solution Commonly known as: PERIDEX SMARTSIG:By Mouth   empagliflozin 25 MG Tabs tablet Commonly known as: JARDIANCE Take 1 tablet (25 mg total) by mouth daily.   fenofibrate 145 MG tablet Commonly known as: Tricor Take 1 tablet (145 mg total) by mouth daily.   glipiZIDE 10 MG tablet Commonly known as: GLUCOTROL TAKE 1 TABLET(10 MG) BY MOUTH TWICE DAILY BEFORE A MEAL What changed: See the new instructions.   Insulin Pen Needle 32G X 4 MM Misc 1 Device by Does not apply route daily in the afternoon.   losartan 100 MG tablet Commonly known as: COZAAR TAKE 1 TABLET BY MOUTH DAILY   metFORMIN 500 MG 24 hr tablet Commonly known as: GLUCOPHAGE-XR Take 2 tablets (1,000 mg total) by mouth daily with breakfast.   traZODone 50 MG tablet Commonly known as: DESYREL TAKE 1/2 TO 1 TABLET(25 TO 50 MG) BY MOUTH AT BEDTIME AS NEEDED FOR SLEEP   Tresiba FlexTouch 100 UNIT/ML FlexTouch Pen Generic drug: insulin degludec Inject 17 Units into the skin daily. What changed: how much to take   Vitamin D3 1.25 MG (50000 UT) Tabs Take 1 tablet by mouth once a week.         ALLERGIES: Allergies   Allergen Reactions   Penicillins Swelling and Other (See Comments)    SWELLING REACTION UNSPECIFIED  Has patient had a PCN reaction causing immediate rash, facial/tongue/throat swelling, SOB or lightheadedness with hypotension: No Has patient had a PCN reaction causing severe rash involving mucus membranes or skin necrosis: No Has patient had a PCN reaction that required hospitalization: No Has patient had a PCN reaction occurring within the last 10 years: No If all  of the above answers are "NO", then may proceed with Cephalosporin use.         OBJECTIVE:   VITAL SIGNS: BP 132/82 (BP Location: Left Arm, Patient Position: Sitting, Cuff Size: Large)   Pulse 90   Ht 6\' 2"  (1.88 m)   Wt 251 lb (113.9 kg)   SpO2 97%   BMI 32.23 kg/m    PHYSICAL EXAM:  General: Pt appears well and is in NAD  Lungs: Clear with good BS bilat with no rales, rhonchi, or wheezes  Heart: RRR   Abdomen: Normoactive bowel sounds, soft, nontender, without masses or organomegaly palpable  Extremities:  Lower extremities - No pretibial edema. No lesions.  Neuro: MS is good with appropriate affect, pt is alert and Ox3    DM foot exam: 08/20/2023  The skin of the feet is intact without sores or ulcerations. The pedal pulses are 2+ on right and 2+ on left. The sensation is intact to a screening 5.07, 10 gram monofilament bilaterally   DATA REVIEWED:  Lab Results  Component Value Date   HGBA1C 8.4 (A) 08/20/2023   HGBA1C 8.0 (A) 02/12/2023   HGBA1C 10.2 (A) 08/06/2022    Latest Reference Range & Units 08/20/23 08:23  Sodium 135 - 145 mEq/L 138  Potassium 3.5 - 5.1 mEq/L 3.8  Chloride 96 - 112 mEq/L 102  CO2 19 - 32 mEq/L 27  Glucose 70 - 99 mg/dL 132 (H)  BUN 6 - 23 mg/dL 16  Creatinine 4.40 - 1.02 mg/dL 7.25  Calcium 8.4 - 36.6 mg/dL 9.8  Alkaline Phosphatase 39 - 117 U/L 45  Albumin 3.5 - 5.2 g/dL 4.7  AST 0 - 37 U/L 24  ALT 0 - 53 U/L 41  Total Protein 6.0 - 8.3 g/dL 7.4  Total  Bilirubin 0.2 - 1.2 mg/dL 0.6  GFR >44.03 mL/min 109.71    Latest Reference Range & Units 08/20/23 08:23  Total CHOL/HDL Ratio  5  Cholesterol 0 - 200 mg/dL 474  HDL Cholesterol >25.95 mg/dL 63.87 (L)  LDL (calc) 0 - 99 mg/dL 63  MICROALB/CREAT RATIO 0.0 - 30.0 mg/g 12.3  NonHDL  111.21  Triglycerides 0.0 - 149.0 mg/dL 564.3 (H)  VLDL 0.0 - 32.9 mg/dL 51.8 (H)    Latest Reference Range & Units 08/20/23 08:23  Creatinine,U mg/dL 84.1  Microalb, Ur 0.0 - 1.9 mg/dL 7.1 (H)  MICROALB/CREAT RATIO 0.0 - 30.0 mg/g 12.3  (H): Data is abnormally high     In office BG 173 Mg/DL  ASSESSMENT / PLAN / RECOMMENDATIONS:   1) Type 2 Diabetes Mellitus, Poorly controlled, With Macrovascular  complications - Most recent A1c of 8.4 %. Goal A1c < 7.0 %.    - Praised the pt on improved glycemic control - He is unable to take the 2nd dose of Glipizide in the evening due to busy schedule but takes it in the morning  - I have offered GLP-1 agonist but his girlfriend ended up with bowel obstruction while on Ozempic and would like to avoid them - I will change metformin to extended release  - Will increase [place] s below  -CMP, MA/CR ratio normal -He was provided with Accu-Chek guide meter as well as extra strips, if this is not covered patient to check with his insurance regarding coverage   MEDICATIONS: Continue  glipizide 10 mg , 1 tablet daily  Continue metformin 500 mg, 2 tabs daily Continue  Jardiance 25 mg daily Increase Tresiba 17 units daliy  EDUCATION / INSTRUCTIONS: BG monitoring instructions: Patient is instructed to check his blood sugars 1 times a day, fasting . Call [facility] if: BG persistently < 70  I reviewed the Rule of 15 for the treatment of hypoglycemia in detail with the patient. Literature supplied.   2) Diabetic complications:  Eye: Does not have known diabetic retinopathy.  Neuro/ Feet: Does not have known diabetic peripheral neuropathy. Renal:  Patient does not have known baseline CKD. He is  on an ACEI/ARB at present.  3) Hypertriglyceridemia :   -TG continues to trend down - LDL at goal   Medication Continue atorvastatin 80 mg daily Continue fenofibrate 145 mg daily      Signed electronically by: Lyndle Herrlich, MD  [facility]  [facility] [address] [place], [place] [address] Phone: [phone] FAX: [phone]   CC: Eustaquio Boyden, MD [address] [place] [address] Phone: [phone]  Fax: [phone]    Return to Endocrinology clinic as below: Future Appointments  Date Time Provider Department Center  09/01/2023  7:30 AM [facility]  09/08/2023  2:30 PM Eustaquio Boyden, MD [facility]

## 2023-08-20 NOTE — Patient Instructions (Addendum)
-   Increase Glipizide 10 mg, 1 tablet before first meal of the day  and 1 tablet before Last meal of the day  - Continue  Jardiance 25 mg daily - Continue Metformin 500 mg, two tablets daily  - Continue Tresiba 20 units daily   Check sugar before breakfast, Supper when you can    HOW TO TREAT LOW BLOOD SUGARS (Blood sugar LESS THAN 70 MG/DL) Please follow the RULE OF 15 for the treatment of hypoglycemia treatment (when your (blood sugars are less than 70 mg/dL)   STEP 1: Take 15 grams of carbohydrates when your blood sugar is low, which includes:  3-4 GLUCOSE TABS  OR 3-4 OZ OF JUICE OR REGULAR SODA OR ONE TUBE OF GLUCOSE GEL    STEP 2: RECHECK blood sugar in 15 MINUTES STEP 3: If your blood sugar is still low at the 15 minute recheck --> then, go back to STEP 1 and treat AGAIN with another 15 grams of carbohydrates.

## 2023-08-31 ENCOUNTER — Other Ambulatory Visit: Payer: Self-pay | Admitting: Family Medicine

## 2023-08-31 DIAGNOSIS — E559 Vitamin D deficiency, unspecified: Secondary | ICD-10-CM

## 2023-08-31 DIAGNOSIS — M1A9XX0 Chronic gout, unspecified, without tophus (tophi): Secondary | ICD-10-CM

## 2023-08-31 DIAGNOSIS — E1169 Type 2 diabetes mellitus with other specified complication: Secondary | ICD-10-CM

## 2023-09-01 ENCOUNTER — Other Ambulatory Visit (INDEPENDENT_AMBULATORY_CARE_PROVIDER_SITE_OTHER): Payer: Managed Care, Other (non HMO)

## 2023-09-01 DIAGNOSIS — E559 Vitamin D deficiency, unspecified: Secondary | ICD-10-CM | POA: Diagnosis not present

## 2023-09-01 DIAGNOSIS — E1169 Type 2 diabetes mellitus with other specified complication: Secondary | ICD-10-CM | POA: Diagnosis not present

## 2023-09-01 DIAGNOSIS — M1A9XX0 Chronic gout, unspecified, without tophus (tophi): Secondary | ICD-10-CM | POA: Diagnosis not present

## 2023-09-01 DIAGNOSIS — E785 Hyperlipidemia, unspecified: Secondary | ICD-10-CM

## 2023-09-01 LAB — LIPID PANEL
Cholesterol: 169 mg/dL (ref 0–200)
HDL: 30.9 mg/dL — ABNORMAL LOW (ref >39.00)
LDL Cholesterol: 61 mg/dL (ref 0–99)
NonHDL: 138.24
Total CHOL/HDL Ratio: 5
Triglycerides: 387 mg/dL — ABNORMAL HIGH (ref 0.0–149.0)
VLDL: 77.4 mg/dL — ABNORMAL HIGH (ref 0.0–40.0)

## 2023-09-01 LAB — HEMOGLOBIN A1C: Hgb A1c MFr Bld: 8.7 % — ABNORMAL HIGH (ref 4.6–6.5)

## 2023-09-01 LAB — COMPREHENSIVE METABOLIC PANEL
ALT: 34 U/L (ref 0–53)
AST: 16 U/L (ref 0–37)
Albumin: 4.4 g/dL (ref 3.5–5.2)
Alkaline Phosphatase: 48 U/L (ref 39–117)
BUN: 15 mg/dL (ref 6–23)
CO2: 29 meq/L (ref 19–32)
Calcium: 9.7 mg/dL (ref 8.4–10.5)
Chloride: 101 meq/L (ref 96–112)
Creatinine, Ser: 0.67 mg/dL (ref 0.40–1.50)
GFR: 111.14 mL/min (ref >60.00)
Glucose, Bld: 206 mg/dL — ABNORMAL HIGH (ref 70–99)
Potassium: 3.9 meq/L (ref 3.5–5.1)
Sodium: 139 meq/L (ref 135–145)
Total Bilirubin: 0.5 mg/dL (ref 0.2–1.2)
Total Protein: 7 g/dL (ref 6.0–8.3)

## 2023-09-01 LAB — MICROALBUMIN / CREATININE URINE RATIO
Creatinine,U: 41.3 mg/dL
Microalb Creat Ratio: 8.3 mg/g (ref 0.0–30.0)
Microalb, Ur: 3.4 mg/dL — ABNORMAL HIGH (ref 0.0–1.9)

## 2023-09-01 LAB — URIC ACID: Uric Acid, Serum: 4.9 mg/dL (ref 4.0–7.8)

## 2023-09-01 LAB — VITAMIN D 25 HYDROXY (VIT D DEFICIENCY, FRACTURES): VITD: 34.94 ng/mL (ref 30.00–100.00)

## 2023-09-08 ENCOUNTER — Ambulatory Visit (INDEPENDENT_AMBULATORY_CARE_PROVIDER_SITE_OTHER): Payer: Managed Care, Other (non HMO) | Admitting: Family Medicine

## 2023-09-08 ENCOUNTER — Encounter: Payer: Self-pay | Admitting: Family Medicine

## 2023-09-08 VITALS — BP 138/88 | HR 105 | Temp 99.0°F | Ht 73.5 in | Wt 248.1 lb

## 2023-09-08 DIAGNOSIS — I10 Essential (primary) hypertension: Secondary | ICD-10-CM

## 2023-09-08 DIAGNOSIS — Z0000 Encounter for general adult medical examination without abnormal findings: Secondary | ICD-10-CM | POA: Diagnosis not present

## 2023-09-08 DIAGNOSIS — E785 Hyperlipidemia, unspecified: Secondary | ICD-10-CM

## 2023-09-08 DIAGNOSIS — M1A9XX0 Chronic gout, unspecified, without tophus (tophi): Secondary | ICD-10-CM

## 2023-09-08 DIAGNOSIS — K219 Gastro-esophageal reflux disease without esophagitis: Secondary | ICD-10-CM

## 2023-09-08 DIAGNOSIS — I2510 Atherosclerotic heart disease of native coronary artery without angina pectoris: Secondary | ICD-10-CM

## 2023-09-08 DIAGNOSIS — G4733 Obstructive sleep apnea (adult) (pediatric): Secondary | ICD-10-CM

## 2023-09-08 DIAGNOSIS — E1165 Type 2 diabetes mellitus with hyperglycemia: Secondary | ICD-10-CM

## 2023-09-08 DIAGNOSIS — E559 Vitamin D deficiency, unspecified: Secondary | ICD-10-CM

## 2023-09-08 DIAGNOSIS — E66811 Obesity, class 1: Secondary | ICD-10-CM

## 2023-09-08 DIAGNOSIS — E781 Pure hyperglyceridemia: Secondary | ICD-10-CM

## 2023-09-08 DIAGNOSIS — Z1211 Encounter for screening for malignant neoplasm of colon: Secondary | ICD-10-CM

## 2023-09-08 DIAGNOSIS — E1169 Type 2 diabetes mellitus with other specified complication: Secondary | ICD-10-CM

## 2023-09-08 MED ORDER — TRAZODONE HCL 50 MG PO TABS: 50.0000 mg | ORAL_TABLET | Freq: Every evening | ORAL | 0 refills | Status: DC | PRN

## 2023-09-08 MED ORDER — MULTIVITAMIN GUMMIES ADULT PO CHEW: 1.0000 | CHEWABLE_TABLET | Freq: Every day | ORAL | Status: DC

## 2023-09-08 MED ORDER — ALLOPURINOL 300 MG PO TABS: 300.0000 mg | ORAL_TABLET | Freq: Every day | ORAL | 4 refills | Status: DC

## 2023-09-08 MED ORDER — CO-ENZYME Q10 100 MG PO CAPS: 1.0000 | ORAL_CAPSULE | Freq: Every day | ORAL | Status: DC

## 2023-09-08 MED ORDER — AMLODIPINE BESYLATE 10 MG PO TABS: ORAL_TABLET | ORAL | 4 refills | Status: DC

## 2023-09-08 MED ORDER — VITAMIN D3 1.25 MG (50000 UT) PO TABS: 1.0000 | ORAL_TABLET | ORAL | 4 refills | Status: DC

## 2023-09-08 MED ORDER — LOSARTAN POTASSIUM 100 MG PO TABS: ORAL_TABLET | ORAL | 4 refills | Status: DC

## 2023-09-08 NOTE — Assessment & Plan Note (Signed)
Chronic, stable on allopurinol '300mg'$  daily without recent gout flare.

## 2023-09-08 NOTE — Assessment & Plan Note (Signed)
Preventative protocols reviewed and updated unless pt declined. Discussed healthy diet and lifestyle.  

## 2023-09-08 NOTE — Assessment & Plan Note (Signed)
Continue statin. 

## 2023-09-08 NOTE — Assessment & Plan Note (Addendum)
H/o OSA but could not tolerate CPAP mask.  Endorses ongoing non-restorative sleep, loud snoring, witnessed apnea (not recently), daytime somnolence.  To check with dentist about oral appliance or to consider Inspire implantable device.

## 2023-09-08 NOTE — Assessment & Plan Note (Signed)
Chronic, stable. Continue current regimen. 

## 2023-09-08 NOTE — Assessment & Plan Note (Signed)
Chronic, LDL stable on high potency statin. Given myalgias, will trial lower atorvastatin dose 40mg  (1/2 tab) daily The 10-year ASCVD risk score (Arnett DK, et al., 2019) is: 8.6%   Values used to calculate the score:     Age: 47 years     Sex: Male     Is Non-Hispanic African American: No     Diabetic: Yes     Tobacco smoker: No     Systolic Blood Pressure: 138 mmHg     Is BP treated: Yes     HDL Cholesterol: 30.9 mg/dL     Total Cholesterol: 169 mg/dL d

## 2023-09-08 NOTE — Assessment & Plan Note (Signed)
Chronic, appreciate endo care

## 2023-09-08 NOTE — Patient Instructions (Addendum)
Pass by lab to pick up stool kit.  Continue current medicines Trial Co Enzyme Q10 supplement daily for muscle health. If ongoing muscle pains, try 1/2 tablet atorvastatin, update [place] and endo with effect. Have eye doctor send [place] a copy of your diabetic eye exam.  Good to see you today Return as needed or in 6 months for follow up visit

## 2023-09-08 NOTE — Assessment & Plan Note (Signed)
Continue statin + fibrate through endo.

## 2023-09-08 NOTE — Assessment & Plan Note (Addendum)
Levels did not improve on daily 4000 international units  - improved on 50k weekly dosing - will continue this.

## 2023-09-08 NOTE — Assessment & Plan Note (Signed)
Congratulated on ongoing weight loss. He has cut out added sugars.  Watching portion sizes.  Some walking.

## 2023-09-08 NOTE — Progress Notes (Signed)
Ph: [phone] Fax: [phone]   Patient ID: Kyle [name], male    DOB: 1976-03-13, 47 y.o.   MRN: [id number]  This visit was conducted in person.  BP 138/88   Pulse (!) 105   Temp 99 F (37.2 C) (Oral)   Ht 6' 1.5" (1.867 m)   Wt 248 lb 2 oz (112.5 kg)   SpO2 98%   BMI 32.29 kg/m    CC: CPE Subjective:   HPI: Kyle [name] is a 47 y.o. male presenting on 09/08/2023 for Annual Exam   GF in and out of hospital with diverticulitis.  Recently had 2 teeth pulled for tooth infection. Had 4 courses of antibiotics after this.   Notes worsening muscle and joint pains - mostly legs (calves, thighs). He is on high potency statin through endo. No leg weakness or numbness. Notes left knee discomfort worse as day goes on.   Saw cardiology 2022 - CT coronary angio 07/2021 showing mild nonobstructive CAD with CAC score of 2.1 (77%ile). No recent chest discomfort.   DM - followed by endo Shamleffer on jardiance 25mg  daily, glipizide 10mg  bid, tresiba 20u daily and metformin XR 1000mg  daily.  Lab Results  Component Value Date   HGBA1C 8.7 (H) 09/01/2023   Gout - continues allopurinol 300mg  daily   Preventative:   Colon cancer screening - colonoscopy 2007. was rescheduled last year - had to cancer due to getting COVID.  Prostate cancer in father age 59 - father passed away from this (mets)  Lung cancer screening - not eligible  Flu shot yearly Td 2009, Tdap 2019 Pneumovax 2015 COVID vaccine - Pfizer 01/2020, 02/2020, no boosters Seat belt use discussed  Sunscreen use discussed. No changing moles.  Non smoker  Alcohol - 2-3 beers/week  Dentist Q6 mo  Eye exam yearly   Caffeine: 4 cups coffee/day Widower. Wife passed from COVID 11/2019. Lives with 2 children (adopted). As GF Occupation: Production designer, theatre/television/film at [employer]	[place] Activity: no regular exercise  Diet: good water daily, fruits/vegetables some      Relevant past medical, surgical, family and social history  reviewed and updated as indicated. Interim medical history since our last visit reviewed. Allergies and medications reviewed and updated. Outpatient Medications Prior to Visit  Medication Sig Dispense Refill   atorvastatin (LIPITOR) 80 MG tablet Take 1 tablet (80 mg total) by mouth daily. 90 tablet 3   chlorhexidine (PERIDEX) 0.12 % solution SMARTSIG:By Mouth     empagliflozin (JARDIANCE) 25 MG TABS tablet Take 1 tablet (25 mg total) by mouth daily. 90 tablet 3   fenofibrate (TRICOR) 145 MG tablet Take 1 tablet (145 mg total) by mouth daily. 90 tablet 3   glipiZIDE (GLUCOTROL) 10 MG tablet Take 1 tablet (10 mg total) by mouth 2 (two) times daily before a meal. 180 tablet 3   glucose blood (ACCU-CHEK GUIDE) test strip 1 each by Other route in the morning and at bedtime. Use as instructed 200 each 3   insulin degludec (TRESIBA FLEXTOUCH) 100 UNIT/ML FlexTouch Pen Inject 20 Units into the skin daily. 15 mL 4   Insulin Pen Needle 32G X 4 MM MISC 1 Device by Does not apply route daily in the afternoon. 100 each 3   metFORMIN (GLUCOPHAGE-XR) 500 MG 24 hr tablet Take 2 tablets (1,000 mg total) by mouth daily with breakfast. 180 tablet 3   allopurinol (ZYLOPRIM) 300 MG tablet Take 1 tablet (300 mg total) by mouth daily. 90 tablet 3  amLODipine (NORVASC) 10 MG tablet TAKE 1 TABLET BY MOUTH DAILY 90 tablet 3   Cholecalciferol (VITAMIN D3) 1.25 MG (50000 UT) TABS Take 1 tablet by mouth once a week. 12 tablet 3   losartan (COZAAR) 100 MG tablet TAKE 1 TABLET BY MOUTH DAILY 90 tablet 3   traZODone (DESYREL) 50 MG tablet TAKE 1/2 TO 1 TABLET(25 TO 50 MG) BY MOUTH AT BEDTIME AS NEEDED FOR SLEEP 90 tablet 1   No facility-administered medications prior to visit.     Per HPI unless specifically indicated in ROS section below Review of Systems  Constitutional:  Negative for activity change, appetite change, chills, fatigue, fever and unexpected weight change.  HENT:  Negative for hearing loss.   Eyes:   Negative for visual disturbance.  Respiratory:  Negative for cough, chest tightness, shortness of breath and wheezing.   Cardiovascular:  Negative for chest pain, palpitations and leg swelling.  Gastrointestinal:  Positive for constipation (alternating) and diarrhea (alternating). Negative for abdominal distention, abdominal pain, blood in stool, nausea and vomiting.  Genitourinary:  Negative for difficulty urinating and hematuria.  Musculoskeletal:  Negative for arthralgias, myalgias and neck pain.  Skin:  Negative for rash.  Neurological:  Negative for dizziness, seizures, syncope and headaches.  Hematological:  Negative for adenopathy. Does not bruise/bleed easily.  Psychiatric/Behavioral:  Negative for dysphoric mood. The patient is not nervous/anxious.     Objective:  BP 138/88   Pulse (!) 105   Temp 99 F (37.2 C) (Oral)   Ht 6' 1.5" (1.867 m)   Wt 248 lb 2 oz (112.5 kg)   SpO2 98%   BMI 32.29 kg/m   Wt Readings from Last 3 Encounters:  09/08/23 248 lb 2 oz (112.5 kg)  08/20/23 251 lb (113.9 kg)  02/19/23 261 lb (118.4 kg)      Physical Exam Vitals and nursing note reviewed.  Constitutional:      General: He is not in acute distress.    Appearance: Normal appearance. He is well-developed. He is not ill-appearing.  HENT:     Head: Normocephalic and atraumatic.     Right Ear: Hearing, tympanic membrane, ear canal and external ear normal.     Left Ear: Hearing, tympanic membrane, ear canal and external ear normal.     Mouth/Throat:     Mouth: Mucous membranes are moist.     Pharynx: Oropharynx is clear. No oropharyngeal exudate or posterior oropharyngeal erythema.  Eyes:     General: No scleral icterus.    Extraocular Movements: Extraocular movements intact.     Conjunctiva/sclera: Conjunctivae normal.     Pupils: Pupils are equal, round, and reactive to light.  Neck:     Thyroid: No thyroid mass or thyromegaly.  Cardiovascular:     Rate and Rhythm: Normal rate and  regular rhythm.     Pulses: Normal pulses.          Radial pulses are 2+ on the right side and 2+ on the left side.     Heart sounds: Normal heart sounds. No murmur heard. Pulmonary:     Effort: Pulmonary effort is normal. No respiratory distress.     Breath sounds: Normal breath sounds. No wheezing, rhonchi or rales.  Abdominal:     General: Bowel sounds are normal. There is no distension.     Palpations: Abdomen is soft. There is no mass.     Tenderness: There is no abdominal tenderness. There is no guarding or rebound.  Hernia: No hernia is present.  Musculoskeletal:        General: Normal range of motion.     Cervical back: Normal range of motion and neck supple.     Right lower leg: No edema.     Left lower leg: No edema.  Lymphadenopathy:     Cervical: No cervical adenopathy.  Skin:    General: Skin is warm and dry.     Findings: No rash.  Neurological:     General: No focal deficit present.     Mental Status: He is alert and oriented to person, place, and time.  Psychiatric:        Mood and Affect: Mood normal.        Behavior: Behavior normal.        Thought Content: Thought content normal.        Judgment: Judgment normal.       Results for orders placed or performed in visit on 09/01/23  VITAMIN D 25 Hydroxy (Vit-D Deficiency, Fractures)  Result Value Ref Range   VITD 34.94 30.00 - 100.00 ng/mL  Uric acid  Result Value Ref Range   Uric Acid, Serum 4.9 4.0 - 7.8 mg/dL  Comprehensive metabolic panel  Result Value Ref Range   Sodium 139 135 - 145 mEq/L   Potassium 3.9 3.5 - 5.1 mEq/L   Chloride 101 96 - 112 mEq/L   CO2 29 19 - 32 mEq/L   Glucose, Bld 206 (H) 70 - 99 mg/dL   BUN 15 6 - 23 mg/dL   Creatinine, Ser 0.98 0.40 - 1.50 mg/dL   Total Bilirubin 0.5 0.2 - 1.2 mg/dL   Alkaline Phosphatase 48 39 - 117 U/L   AST 16 0 - 37 U/L   ALT 34 0 - 53 U/L   Total Protein 7.0 6.0 - 8.3 g/dL   Albumin 4.4 3.5 - 5.2 g/dL   GFR 119.14 >78.29 mL/min   Calcium 9.7  8.4 - 10.5 mg/dL  Lipid panel  Result Value Ref Range   Cholesterol 169 0 - 200 mg/dL   Triglycerides 562.1 (H) 0.0 - 149.0 mg/dL   HDL 30.86 (L) >57.84 mg/dL   VLDL 69.6 (H) 0.0 - 29.5 mg/dL   LDL Cholesterol 61 0 - 99 mg/dL   Total CHOL/HDL Ratio 5    NonHDL 138.24   Microalbumin / creatinine urine ratio  Result Value Ref Range   Microalb, Ur 3.4 (H) 0.0 - 1.9 mg/dL   Creatinine,U 28.4 mg/dL   Microalb Creat Ratio 8.3 0.0 - 30.0 mg/g  Hemoglobin A1c  Result Value Ref Range   Hgb A1c MFr Bld 8.7 (H) 4.6 - 6.5 %   Lab Results  Component Value Date   PSA 0.36 08/14/2021   Assessment & Plan:   Problem List Items Addressed This Visit     Health maintenance examination - Primary (Chronic)    Preventative protocols reviewed and updated unless pt declined. Discussed healthy diet and lifestyle.       Gout    Chronic, stable on allopurinol 300mg  daily without recent gout flare.       Relevant Medications   allopurinol (ZYLOPRIM) 300 MG tablet   Essential hypertension    Chronic, stable. Continue current regimen.       Relevant Medications   amLODipine (NORVASC) 10 MG tablet   losartan (COZAAR) 100 MG tablet   GERD    Chronic, stable off medication.       OSA (obstructive sleep apnea)  H/o OSA but could not tolerate CPAP mask.  Endorses ongoing non-restorative sleep, loud snoring, witnessed apnea (not recently), daytime somnolence.  To check with dentist about oral appliance or to consider Inspire implantable device.       Obesity, Class I, BMI 30.0-34.9 (see actual BMI)    Congratulated on ongoing weight loss. He has cut out added sugars.  Watching portion sizes.  Some walking.       Type 2 diabetes mellitus with other specified complication (HCC)    Chronic, appreciate endo care       Relevant Medications   losartan (COZAAR) 100 MG tablet   Dyslipidemia associated with type 2 diabetes mellitus (HCC)    Chronic, LDL stable on high potency statin. Given  myalgias, will trial lower atorvastatin dose 40mg  (1/2 tab) daily The 10-year ASCVD risk score (Arnett DK, et al., 2019) is: 8.6%   Values used to calculate the score:     Age: 89 years     Sex: Male     Is Non-Hispanic African American: No     Diabetic: Yes     Tobacco smoker: No     Systolic Blood Pressure: 138 mmHg     Is BP treated: Yes     HDL Cholesterol: 30.9 mg/dL     Total Cholesterol: 169 mg/dL d      Relevant Medications   losartan (COZAAR) 100 MG tablet   Vitamin D deficiency    Levels did not improve on daily 4000 international units  - improved on 50k weekly dosing - will continue this.      CAD (coronary artery disease)    Continue statin.       Relevant Medications   amLODipine (NORVASC) 10 MG tablet   losartan (COZAAR) 100 MG tablet   Hypertriglyceridemia    Continue statin + fibrate through endo.       Relevant Medications   amLODipine (NORVASC) 10 MG tablet   losartan (COZAAR) 100 MG tablet   Type 2 diabetes mellitus with hyperglycemia, without long-term current use of insulin (HCC)   Relevant Medications   losartan (COZAAR) 100 MG tablet   Other Visit Diagnoses     Special screening for malignant neoplasms, colon       Relevant Orders   Fecal occult blood, imunochemical        Meds ordered this encounter  Medications   allopurinol (ZYLOPRIM) 300 MG tablet    Sig: Take 1 tablet (300 mg total) by mouth daily.    Dispense:  90 tablet    Refill:  4   amLODipine (NORVASC) 10 MG tablet    Sig: TAKE 1 TABLET BY MOUTH DAILY    Dispense:  90 tablet    Refill:  4   losartan (COZAAR) 100 MG tablet    Sig: TAKE 1 TABLET BY MOUTH DAILY    Dispense:  90 tablet    Refill:  4   Multiple Vitamins-Minerals (MULTIVITAMIN GUMMIES ADULT) CHEW    Sig: Chew 1 tablet by mouth daily.   Co-Enzyme Q10 100 MG CAPS    Sig: Take 1 capsule by mouth daily.   Cholecalciferol (VITAMIN D3) 1.25 MG (50000 UT) TABS    Sig: Take 1 tablet by mouth once a week.     Dispense:  12 tablet    Refill:  4   traZODone (DESYREL) 50 MG tablet    Sig: Take 1 tablet (50 mg total) by mouth at bedtime as needed for sleep.  Dispense:  90 tablet    Refill:  0    Orders Placed This Encounter  Procedures   Fecal occult blood, imunochemical    Standing Status:   Future    Standing Expiration Date:   09/07/2024    Patient Instructions  Pass by lab to pick up stool kit.  Continue current medicines Trial Co Enzyme Q10 supplement daily for muscle health. If ongoing muscle pains, try 1/2 tablet atorvastatin, update [place] and endo with effect. Have eye doctor send [place] a copy of your diabetic eye exam.  Good to see you today Return as needed or in 6 months for follow up visit  Follow up plan: Return in about 6 months (around 03/07/2024) for follow up visit.  Eustaquio Boyden, MD

## 2023-09-08 NOTE — Assessment & Plan Note (Signed)
Chronic, stable off medication.  

## 2023-09-17 ENCOUNTER — Other Ambulatory Visit: Payer: Self-pay | Admitting: Family Medicine

## 2023-09-17 DIAGNOSIS — M1A9XX0 Chronic gout, unspecified, without tophus (tophi): Secondary | ICD-10-CM

## 2023-09-17 DIAGNOSIS — I10 Essential (primary) hypertension: Secondary | ICD-10-CM

## 2023-09-22 IMAGING — CT CT HEART MORP W/ CTA COR W/ SCORE W/ CA W/CM &/OR W/O CM
4 of 7 series · 8 of 20 positions shown, 9 images · IV contrast (APPLIED)
Comparison: 02/01/2017 chest radiograph
COMPARISON: 02/01/2017 chest radiograph

Addendum:
EXAM:
OVER-READ INTERPRETATION  CT CHEST

The following report is an over-read performed by radiologist Dr.
over-read does not include interpretation of cardiac or coronary
anatomy or pathology. The coronary CTA interpretation by the
cardiologist is attached.
CLINICAL DATA: Chest pain
Cardiac CTA
MEDICATIONS:
Sub lingual nitro. 4mg x 2
TECHNIQUE: The patient was scanned on a Siemens [REDACTED]ice scanner. Gantry
rotation speed was 250 msecs. Collimation was 0.6 mm. A 100 kV
prospective scan was triggered in the ascending thoracic aorta at
35-75% of the R-R interval. Average HR during the scan was 60 bpm.
The 3D data set was interpreted on a dedicated work station using
MPR, MIP and VRT modes. A total of 80cc of contrast was used.

[Series 6: best diast 72 % · axial · 0.39mm/px · z∈[+1208,+1247]mm · 2 of 291 slices shown]
[im 97/291  vessel]
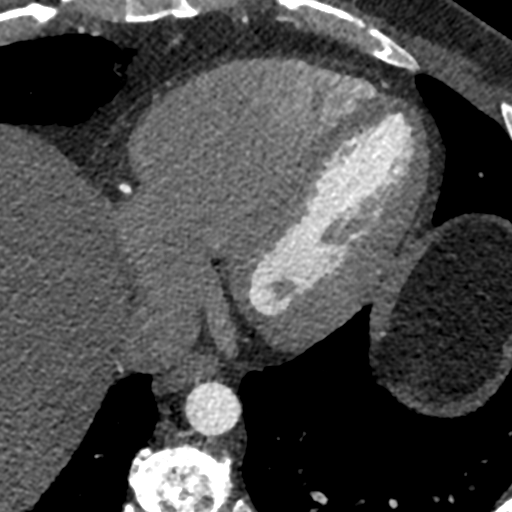
[im 194/291  vessel]
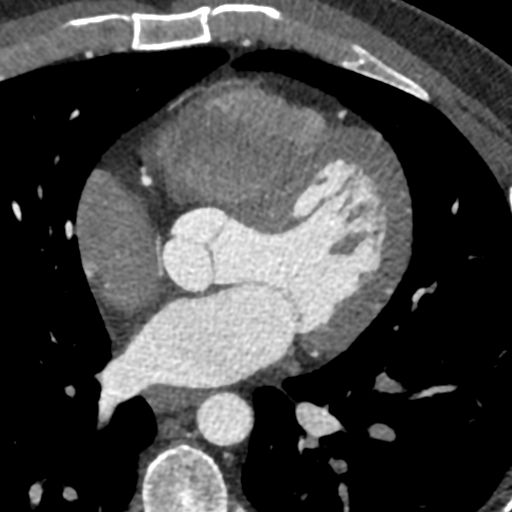

[Series 7: best syst · axial · 0.39mm/px · z∈[+1208,+1247]mm · 2 of 291 slices shown, 3 images]
[im 97/291  vessel]
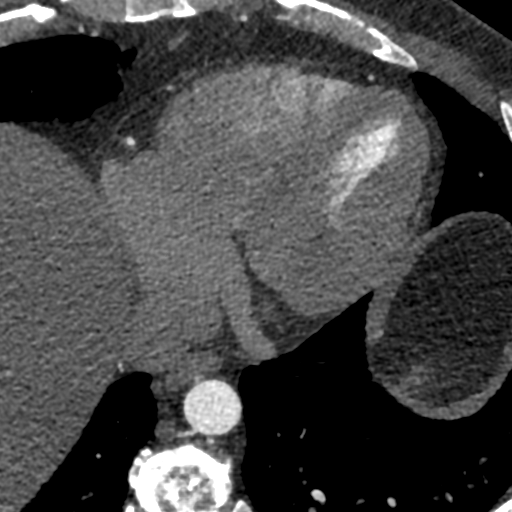
[im 97/291  lung]
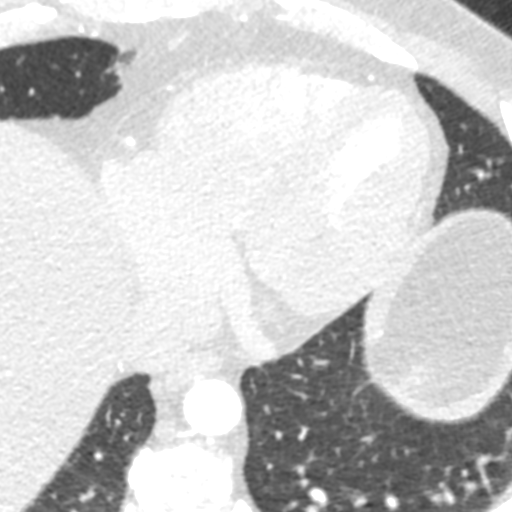
[im 194/291  vessel]
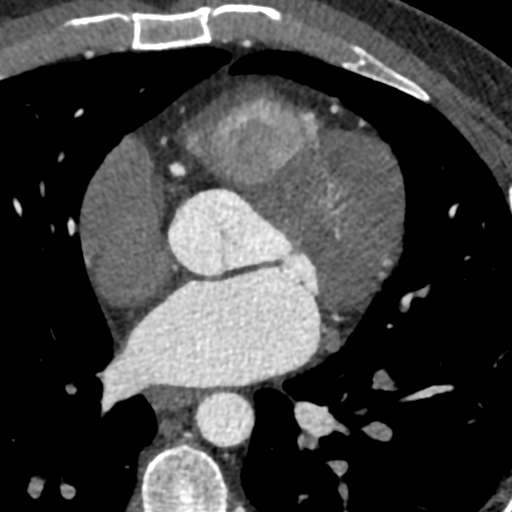

[Series 8: ts diast sharp 72 % · axial · 0.39mm/px · z∈[+1208,+1247]mm · 2 of 291 slices shown]
[im 97/291  lung]
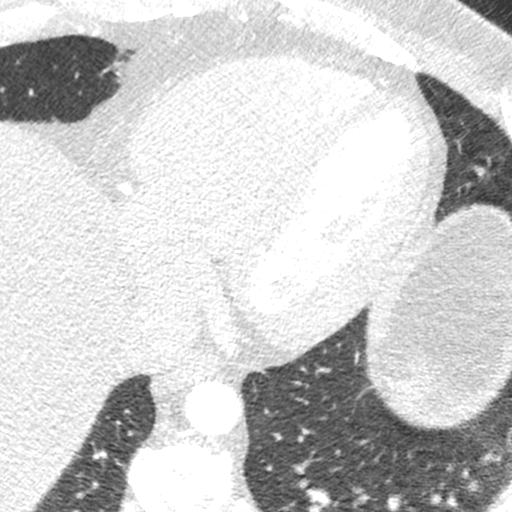
[im 194/291  lung]
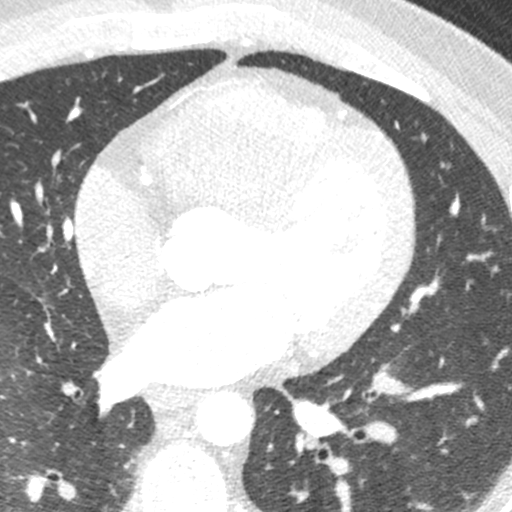

[Series 9: ts syst sharp · axial · 0.39mm/px · z∈[+1208,+1247]mm · 2 of 291 slices shown]
[im 97/291  lung]
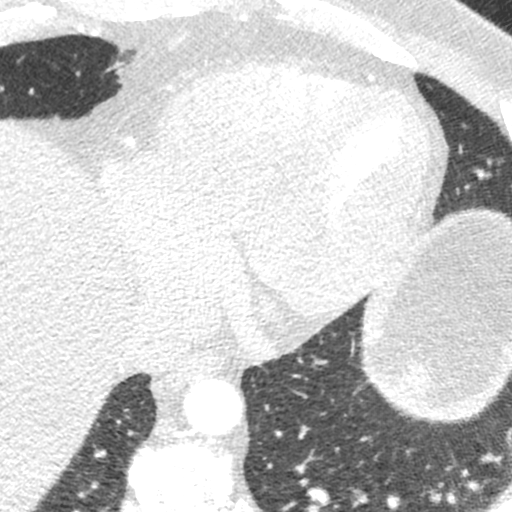
[im 194/291  lung]
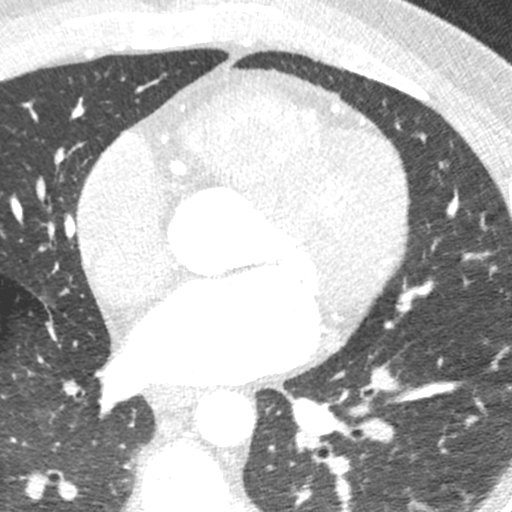

[8 of 20 positions shown; findings below may reference images not displayed]

FINDINGS: Vascular: Normal aortic caliber. No central pulmonary embolism, on
this non-dedicated study.

Mediastinum/Nodes: No imaged thoracic adenopathy.

Lungs/Pleura: No pleural fluid. Right middle lobe centrally
calcified pulmonary nodule measures 8 mm and can be presumed benign,
including on [DATE].

A subpleural lymph node along the left major fissure at 4 mm on
[DATE].

Upper Abdomen: Hepatic steatosis. Normal imaged portions of the
spleen, stomach.

Musculoskeletal: No acute osseous abnormality.
IMPRESSION: 1.  No acute findings in the imaged extracardiac chest.
2. Right middle lobe 8 mm pulmonary nodule with central
calcification, can be presumed benign. Either a granuloma or
hamartoma.
3. Hepatic steatosis.
FINDINGS: Non-cardiac: See separate report from [REDACTED].

No LA appendage thrombus. Pulmonary veins drain normally to the left
atrium.

Calcium Score: 2.1 Agatston units.

Coronary Arteries: Right dominant with no anomalies

LM: No plaque or stenosis.

LAD system: Mixed plaque proximal/mid LAD, minimal stenosis.

Circumflex system: No plaque or stenosis.  Large high OM1.

RCA system: No plaque or stenosis.
IMPRESSION: 1. Coronary artery calcium score 2.1 Agatston units. This places the
patient in the 77th percentile for age and gender, suggesting
intermediate risk for future cardiac events.

2.  Mild, nonobstructive CAD.

Hok Leung [place]

*** End of Addendum ***
EXAM:
OVER-READ INTERPRETATION  CT CHEST

The following report is an over-read performed by radiologist Dr.
over-read does not include interpretation of cardiac or coronary
anatomy or pathology. The coronary CTA interpretation by the
cardiologist is attached.
FINDINGS: Vascular: Normal aortic caliber. No central pulmonary embolism, on
this non-dedicated study.

Mediastinum/Nodes: No imaged thoracic adenopathy.

Lungs/Pleura: No pleural fluid. Right middle lobe centrally
calcified pulmonary nodule measures 8 mm and can be presumed benign,
including on [DATE].

A subpleural lymph node along the left major fissure at 4 mm on
[DATE].

Upper Abdomen: Hepatic steatosis. Normal imaged portions of the
spleen, stomach.

Musculoskeletal: No acute osseous abnormality.
IMPRESSION: 1.  No acute findings in the imaged extracardiac chest.
2. Right middle lobe 8 mm pulmonary nodule with central
calcification, can be presumed benign. Either a granuloma or
hamartoma.
3. Hepatic steatosis.

## 2023-12-04 ENCOUNTER — Ambulatory Visit: Payer: Managed Care, Other (non HMO) | Admitting: Internal Medicine

## 2024-01-20 ENCOUNTER — Ambulatory Visit: Payer: Managed Care, Other (non HMO) | Admitting: Internal Medicine

## 2024-01-20 ENCOUNTER — Encounter: Payer: Self-pay | Admitting: Internal Medicine

## 2024-01-20 VITALS — BP 126/80 | HR 84 | Wt 250.0 lb

## 2024-01-20 DIAGNOSIS — M2550 Pain in unspecified joint: Secondary | ICD-10-CM | POA: Diagnosis not present

## 2024-01-20 DIAGNOSIS — Z794 Long term (current) use of insulin: Secondary | ICD-10-CM | POA: Diagnosis not present

## 2024-01-20 DIAGNOSIS — Z7984 Long term (current) use of oral hypoglycemic drugs: Secondary | ICD-10-CM | POA: Diagnosis not present

## 2024-01-20 DIAGNOSIS — E1165 Type 2 diabetes mellitus with hyperglycemia: Secondary | ICD-10-CM

## 2024-01-20 DIAGNOSIS — E781 Pure hyperglyceridemia: Secondary | ICD-10-CM | POA: Diagnosis not present

## 2024-01-20 LAB — POCT GLYCOSYLATED HEMOGLOBIN (HGB A1C): Hemoglobin A1C: 8.9 % — AB (ref 4.0–5.6)

## 2024-01-20 MED ORDER — PIOGLITAZONE HCL 15 MG PO TABS: 15.0000 mg | ORAL_TABLET | Freq: Every day | ORAL | 3 refills | Status: DC

## 2024-01-20 MED ORDER — GLIPIZIDE 10 MG PO TABS: 10.0000 mg | ORAL_TABLET | Freq: Two times a day (BID) | ORAL | 3 refills | Status: DC

## 2024-01-20 MED ORDER — ROSUVASTATIN CALCIUM 40 MG PO TABS: 40.0000 mg | ORAL_TABLET | Freq: Every day | ORAL | 3 refills | Status: AC

## 2024-01-20 NOTE — Patient Instructions (Addendum)
-   Increase Glipizide 10 mg, 2 tablet before first meal of the day  and 2 tablet before Last meal of the day  - Start Pioglitazone ( Actos) 15 mg, 1 tablet daily  - Continue  Jardiance 25 mg daily - Continue Metformin 500 mg, two tablets daily  - Continue Tresiba 20 units daily   Start over-the-counter vitamin D3 1000 international units daily  HOW TO TREAT LOW BLOOD SUGARS (Blood sugar LESS THAN 70 MG/DL) Please follow the RULE OF 15 for the treatment of hypoglycemia treatment (when your (blood sugars are less than 70 mg/dL)   STEP 1: Take 15 grams of carbohydrates when your blood sugar is low, which includes:  3-4 GLUCOSE TABS  OR 3-4 OZ OF JUICE OR REGULAR SODA OR ONE TUBE OF GLUCOSE GEL    STEP 2: RECHECK blood sugar in 15 MINUTES STEP 3: If your blood sugar is still low at the 15 minute recheck --> then, go back to STEP 1 and treat AGAIN with another 15 grams of carbohydrates.

## 2024-01-20 NOTE — Progress Notes (Signed)
 Name: Kyle [name]  MRN/ DOB: [id number], August 18, 1976   Age/ Sex: 48 y.o., male    PCP: Kyle Boyden, MD   Reason for Endocrinology Evaluation: Type 2 Diabetes Mellitus     Date of Initial Endocrinology Visit: 04/02/2022    PATIENT IDENTIFIER: Kyle [name] is a 48 y.o. male with a past medical history of T2DM, Dyslipidemia, CAD and OSA. The patient presented for initial endocrinology clinic visit on 04/02/2022 for consultative assistance with his diabetes management.    HPI: Kyle [name] was    Diagnosed with DM in 2018 Prior Medications tried/Intolerance: Intolerant to higher doses of metformin . Was on insulin at some point.            Hemoglobin A1c has ranged from 6.8% in 2018, peaking at 11.5% in 2022.  No prior dx of pancreatitis  Has chronic GI issues with alternating bowel movement    On his initial visit to our clinic his A1c was 9.1%, he was on Jardiance and metformin which we continued and added glipizide  He was started on insulin by December 2023 due to persistent hyperglycemia    SUBJECTIVE:   During the last visit (08/20/2023): A1C 8.4%     Today (01/20/24): Kyle [name] is here for follow-up on diabetes management.  He checks his blood sugars 1x daily .  The patient has not had hypoglycemic episodes since the last clinic visit.   Denies nausea, or vomiting Continues with  chronic diarrhea since childhood alternating with constipation  Had dental infections in 09/2023 and was on multiple Abx   Has noted joint aches with increasing atorvastatin and fenofibrate     HOME DIABETES REGIMEN: Jardiance 25 mg daily  Metformin 500 mg 2 tabs daily  Glipizide 10 mg BID Tresiba  20 units daily Atorvastatin 80 mg daily Fenofibrate 145 mg daily   Statin: yes ACE-I/ARB: yes    METER DOWNLOAD SUMMARY:  159- 279     DIABETIC COMPLICATIONS: Microvascular complications:   Denies: CKD, neuropathy, retinopathy  Last eye exam: Completed  2023  Macrovascular complications:  CAD Denies:  PVD, CVA   PAST HISTORY: Past Medical History:  Past Medical History:  Diagnosis Date   COVID-19 11/09/2019   Diabetes type 2, controlled (HCC)    Kyle [name]   Dyslipidemia 05/25/2014   Family history of adverse reaction to anesthesia    Mother has postoperatve N&V   GERD 05/11/2007   Gout, unspecified 09/05/2009   History of kidney stones    h/o kidney stones "regularly over the past 5 years"   HTN (hypertension) 09/08/2008   IBS 01/08/2008   SLEEP APNEA 01/08/2008   cannot tolerate CPAP   Sleep apnea    Past Surgical History:  Past Surgical History:  Procedure Laterality Date   COLONOSCOPY     HERNIA REPAIR     NASAL SEPTOPLASTY W/ TURBINOPLASTY Bilateral 08/28/2018   Procedure: NASAL SEPTOPLASTY WITH TURBINATE REDUCTION;  Surgeon: Kyle Obey, MD;  Location: [name];  Service: [name];  Laterality: Bilateral;   UPPER GASTROINTESTINAL ENDOSCOPY      Social History:  reports that he quit smoking about 20 years ago. His smoking use included cigarettes. He quit smokeless tobacco use about 20 years ago. He reports current alcohol use. He reports that he does not use drugs. Family History:  Family History  Problem Relation Age of Onset   CAD Mother 82   Cancer Father 37       prostate s/p radiation seed implant  CAD Father 40   Hypothyroidism Father 54   Cancer Sister 32       cervical   Colon cancer Maternal Uncle    Cancer Maternal Uncle        colon   Stroke Other        bilateral grandfathers   Hypertension Other        strong on maternal side   CAD Other        bilateral grandfathers   Diabetes Neg Hx    Colon polyps Neg Hx    Crohn's disease Neg Hx    Esophageal cancer Neg Hx    Rectal cancer Neg Hx    Stomach cancer Neg Hx    Ulcerative colitis Neg Hx      HOME MEDICATIONS: Allergies as of 01/20/2024       Reactions   Penicillins Swelling, Other (See Comments)   SWELLING REACTION UNSPECIFIED  Has  patient had a PCN reaction causing immediate rash, facial/tongue/throat swelling, SOB or lightheadedness with hypotension: No Has patient had a PCN reaction causing severe rash involving mucus membranes or skin necrosis: No Has patient had a PCN reaction that required hospitalization: No Has patient had a PCN reaction occurring within the last 10 years: No If all of the above answers are "NO", then may proceed with Cephalosporin use.        Medication List        Accurate as of January 20, 2024  9:32 AM. If you have any questions, ask your nurse or doctor.          Accu-Chek Guide test strip Generic drug: glucose blood 1 each by Other route in the morning and at bedtime. Use as instructed   allopurinol 300 MG tablet Commonly known as: ZYLOPRIM Take 1 tablet (300 mg total) by mouth daily.   amLODipine 10 MG tablet Commonly known as: NORVASC TAKE 1 TABLET BY MOUTH DAILY   atorvastatin 80 MG tablet Commonly known as: LIPITOR Take 1 tablet (80 mg total) by mouth daily.   chlorhexidine 0.12 % solution Commonly known as: PERIDEX SMARTSIG:By Mouth   Co-Enzyme Q10 100 MG Caps Take 1 capsule by mouth daily.   empagliflozin 25 MG Tabs tablet Commonly known as: JARDIANCE Take 1 tablet (25 mg total) by mouth daily.   fenofibrate 145 MG tablet Commonly known as: Tricor Take 1 tablet (145 mg total) by mouth daily.   glipiZIDE 10 MG tablet Commonly known as: GLUCOTROL Take 1 tablet (10 mg total) by mouth 2 (two) times daily before a meal.   ibuprofen 800 MG tablet Commonly known as: ADVIL Take 800 mg by mouth every 6 (six) hours as needed.   Insulin Pen Needle 32G X 4 MM Misc 1 Device by Does not apply route daily in the afternoon.   losartan 100 MG tablet Commonly known as: COZAAR TAKE 1 TABLET BY MOUTH DAILY   metFORMIN 500 MG 24 hr tablet Commonly known as: GLUCOPHAGE-XR Take 2 tablets (1,000 mg total) by mouth daily with breakfast.   Multivitamin Gummies Adult  Chew Chew 1 tablet by mouth daily.   traZODone 50 MG tablet Commonly known as: DESYREL Take 1 tablet (50 mg total) by mouth at bedtime as needed for sleep.   Evaristo Bury FlexTouch 100 UNIT/ML FlexTouch Pen Generic drug: insulin degludec Inject 20 Units into the skin daily.   Vitamin D3 1.25 MG (50000 UT) Tabs Take 1 tablet by mouth once a week.  ALLERGIES: Allergies  Allergen Reactions   Penicillins Swelling and Other (See Comments)    SWELLING REACTION UNSPECIFIED  Has patient had a PCN reaction causing immediate rash, facial/tongue/throat swelling, SOB or lightheadedness with hypotension: No Has patient had a PCN reaction causing severe rash involving mucus membranes or skin necrosis: No Has patient had a PCN reaction that required hospitalization: No Has patient had a PCN reaction occurring within the last 10 years: No If all of the above answers are "NO", then may proceed with Cephalosporin use.         OBJECTIVE:   VITAL SIGNS: BP 126/80 (BP Location: Left Arm, Patient Position: Sitting, Cuff Size: Small)   Pulse 84   Wt 250 lb (113.4 kg)   SpO2 99%   BMI 32.54 kg/m    PHYSICAL EXAM:  General: Pt appears well and is in NAD  Lungs: Clear with good BS bilat with no rales, rhonchi, or wheezes  Heart: RRR   Extremities:  Lower extremities - No pretibial edema. No lesions.  Neuro: MS is good with appropriate affect, pt is alert and Ox3    DM foot exam: 08/20/2023  The skin of the feet is intact without sores or ulcerations. The pedal pulses are 2+ on right and 2+ on left. The sensation is intact to a screening 5.07, 10 gram monofilament bilaterally   DATA REVIEWED:  Lab Results  Component Value Date   HGBA1C 8.7 (H) 09/01/2023   HGBA1C 8.4 (A) 08/20/2023   HGBA1C 8.0 (A) 02/12/2023    Latest Reference Range & Units 08/20/23 08:23  Sodium 135 - 145 mEq/L 138  Potassium 3.5 - 5.1 mEq/L 3.8  Chloride 96 - 112 mEq/L 102  CO2 19 - 32 mEq/L 27   Glucose 70 - 99 mg/dL 161 (H)  BUN 6 - 23 mg/dL 16  Creatinine 0.96 - 0.45 mg/dL 4.09  Calcium 8.4 - 81.1 mg/dL 9.8  Alkaline Phosphatase 39 - 117 U/L 45  Albumin 3.5 - 5.2 g/dL 4.7  AST 0 - 37 U/L 24  ALT 0 - 53 U/L 41  Total Protein 6.0 - 8.3 g/dL 7.4  Total Bilirubin 0.2 - 1.2 mg/dL 0.6  GFR >91.47 mL/min 109.71    Latest Reference Range & Units 08/20/23 08:23  Total CHOL/HDL Ratio  5  Cholesterol 0 - 200 mg/dL 829  HDL Cholesterol >56.21 mg/dL 30.86 (L)  LDL (calc) 0 - 99 mg/dL 63  MICROALB/CREAT RATIO 0.0 - 30.0 mg/g 12.3  NonHDL  111.21  Triglycerides 0.0 - 149.0 mg/dL 578.4 (H)  VLDL 0.0 - 69.6 mg/dL 29.5 (H)    Latest Reference Range & Units 08/20/23 08:23  Creatinine,U mg/dL 28.4  Microalb, Ur 0.0 - 1.9 mg/dL 7.1 (H)  MICROALB/CREAT RATIO 0.0 - 30.0 mg/g 12.3    ASSESSMENT / PLAN / RECOMMENDATIONS:   1) Type 2 Diabetes Mellitus, Poorly controlled, With Macrovascular  complications - Most recent A1c of 8.9 %. Goal A1c < 7.0 %.    -A1c continues to trend upwards -Patient has been noted postprandial hyperglycemia, will increase glipizide as below -We discussed adding pioglitazone, cautioned against edema and weight gain -Patient declines GLP-1 agonist, would like to stay from this class as his girlfriend end up with bowel obstruction while on Ozempic -Encouraged low-carb diet and lifestyle changes with exercise    MEDICATIONS: Start pioglitazone 15 mg daily Increase glipizide 10 mg , 2 tablet twice daily Continue metformin 500 mg, 2 tabs daily Continue  Jardiance 25 mg daily Continue  Tresiba 20 units daliy   EDUCATION / INSTRUCTIONS: BG monitoring instructions: Patient is instructed to check his blood sugars 1 times a day, fasting . Call [facility] if: BG persistently < 70  I reviewed the Rule of 15 for the treatment of hypoglycemia in detail with the patient. Literature supplied.   2) Diabetic complications:  Eye: Does not have known  diabetic retinopathy.  Neuro/ Feet: Does not have known diabetic peripheral neuropathy. Renal: Patient does not have known baseline CKD. He is  on an ACEI/ARB at present.  3) Hypertriglyceridemia :   -TG continues to trend down - LDL at goal  -Patient is complaining of multiple arthralgias, he attributes this to lipid-lowering therapy -I will switch atorvastatin to rosuvastatin as below -If this remains an issue, will consider PCSK9 inhibitors -Patient to start OTC vitamin D3  Medication Stop atorvastatin 80 mg daily Start rosuvastatin 40 mg daily Continue fenofibrate 145 mg daily  Start vitamin D3 1000 IU daily    Follow-up in 3 months  Signed electronically by: Lyndle Herrlich, MD  [facility]  [facility] [address] [place] [place], [place] [address] Phone: [phone] FAX: [phone]   CC: Kyle Boyden, MD [address] [place] [address] Phone: [phone]  Fax: [phone]    Return to Endocrinology clinic as below: Future Appointments  Date Time Provider Department Center  03/17/2024  8:00 AM Kyle Boyden, MD [facility]

## 2024-03-08 ENCOUNTER — Ambulatory Visit: Payer: Managed Care, Other (non HMO) | Admitting: Family Medicine

## 2024-03-10 ENCOUNTER — Other Ambulatory Visit: Payer: Self-pay | Admitting: Internal Medicine

## 2024-03-10 DIAGNOSIS — E1165 Type 2 diabetes mellitus with hyperglycemia: Secondary | ICD-10-CM

## 2024-03-16 ENCOUNTER — Telehealth: Payer: Self-pay

## 2024-03-16 NOTE — Telephone Encounter (Signed)
 Lvm asking pt to call back letting us  know what eye doctor office he uses. We need to request latest DM eye exam report.

## 2024-03-17 ENCOUNTER — Telehealth: Payer: Self-pay | Admitting: Family Medicine

## 2024-03-17 ENCOUNTER — Encounter: Payer: Self-pay | Admitting: Family Medicine

## 2024-03-17 ENCOUNTER — Other Ambulatory Visit: Payer: Self-pay | Admitting: Internal Medicine

## 2024-03-17 ENCOUNTER — Ambulatory Visit: Payer: Managed Care, Other (non HMO) | Admitting: Family Medicine

## 2024-03-17 VITALS — BP 136/80 | HR 83 | Temp 98.6°F | Ht 73.5 in | Wt 252.2 lb

## 2024-03-17 DIAGNOSIS — E1165 Type 2 diabetes mellitus with hyperglycemia: Secondary | ICD-10-CM

## 2024-03-17 DIAGNOSIS — Z794 Long term (current) use of insulin: Secondary | ICD-10-CM

## 2024-03-17 DIAGNOSIS — M25521 Pain in right elbow: Secondary | ICD-10-CM | POA: Diagnosis not present

## 2024-03-17 DIAGNOSIS — I10 Essential (primary) hypertension: Secondary | ICD-10-CM | POA: Diagnosis not present

## 2024-03-17 DIAGNOSIS — E1129 Type 2 diabetes mellitus with other diabetic kidney complication: Secondary | ICD-10-CM

## 2024-03-17 DIAGNOSIS — R809 Proteinuria, unspecified: Secondary | ICD-10-CM

## 2024-03-17 DIAGNOSIS — E1169 Type 2 diabetes mellitus with other specified complication: Secondary | ICD-10-CM | POA: Diagnosis not present

## 2024-03-17 DIAGNOSIS — E785 Hyperlipidemia, unspecified: Secondary | ICD-10-CM

## 2024-03-17 MED ORDER — GLIPIZIDE 10 MG PO TABS: 20.0000 mg | ORAL_TABLET | Freq: Two times a day (BID) | ORAL | 3 refills | Status: DC

## 2024-03-17 NOTE — Assessment & Plan Note (Signed)
 Chronic, uncontrolled, sees endo. Most recently glipizide  increased, actos  started.  He prefers to avoid GLP1RA.

## 2024-03-17 NOTE — Progress Notes (Signed)
 Ph: [phone] Fax: [phone]   Patient ID: Kyle [name], male    DOB: 11-04-1975, 48 y.o.   MRN: [id number]  This visit was conducted in person.  BP 136/80   Pulse 83   Temp 98.6 F (37 C) (Oral)   Ht 6' 1.5" (1.867 m)   Wt 252 lb 4 oz (114.4 kg)   SpO2 95%   BMI 32.83 kg/m    CC: 6 mo f/u visit  Subjective:   HPI: Kyle [name] is a 48 y.o. male presenting on 03/17/2024 for Medical Management of Chronic Issues (Here for 6 mo f/u.)   Discussed recently discovered [facility] laboratory miscalculation - the UACR calculation in the software of the system was incorrect but the absolute levels of microalbumin and creatinine were correct.  Lab Results  Component Value Date   MICROALBUR 3.4 (H) 09/01/2023   MICROALBUR 7.1 (H) 08/20/2023  Microalb/cr ratios have been elevated 80-150 over the past year.  He is already on losartan  100mg  daily as well as jardiance  25mg  daily.   Saw Dr Geralyn Knee Suszanne Eriksson 01/2023 s/p injection to elbow. Notes worsening tennis elbow pain for the past month. Denies inciting trauma/injury. Ibuprofen 600mg  at night and tennis elbow exercises aren't helping. It is worse than last year. Has been using icy hot to area.   HTN - Compliant with current antihypertensive regimen of amlodipine  10mg  daily, losartan  100mg  daily. Does check blood pressures at home - overall stable. [name] low blood pressure readings or symptoms of dizziness/syncope. Denies HA, vision changes, chest tightness, SOB, leg swelling. Occ chest pain.   HLD - continues statin daily as well as fenofibrate  145mg  daily. CoQ10 100mg  cap trial was helpful. Endo changed atorvastastin to rosuvastatin  40mg  with some benefit in joint pains.  Lab Results  Component Value Date   CHOL 169 09/01/2023   HDL 30.90 (L) 09/01/2023   LDLCALC 61 09/01/2023   LDLDIRECT 54.0 02/12/2023   TRIG 387.0 (H) 09/01/2023   CHOLHDL 5 09/01/2023    Lab Results  Component Value Date   ALT 34 09/01/2023   AST 16  09/01/2023   ALKPHOS 48 09/01/2023   BILITOT 0.5 09/01/2023    Lab Results  Component Value Date   CKTOTAL 78 08/30/2007    DM - sees endo Dr Rosalea Collin on jardiance  25mg  daily, glipizide  20mg  bid, tresiba  20u daily, and metformin  XR 1000mg  daily, latest addition of actos  15mg  daily. DM eye exam 2024 [facility] doctor in [place] [facility] [address]. Last foot exam 07/2023. He notes dehydration after exercise can cause sugars to spike.   OSA - CPAP intolerance. To consider oral appliance or dentist versus inspire implantable device through ENT     Relevant past medical, surgical, family and social history reviewed and updated as indicated. Interim medical history since our last visit reviewed. Allergies and medications reviewed and updated. Outpatient Medications Prior to Visit  Medication Sig Dispense Refill   allopurinol  (ZYLOPRIM ) 300 MG tablet Take 1 tablet (300 mg total) by mouth daily. 90 tablet 4   amLODipine  (NORVASC ) 10 MG tablet TAKE 1 TABLET BY MOUTH DAILY 90 tablet 4   Cholecalciferol (VITAMIN D3) 1.25 MG (50000 UT) TABS Take 1 tablet by mouth once a week. 12 tablet 4   Co-Enzyme Q10 100 MG CAPS Take 1 capsule by mouth daily.     empagliflozin  (JARDIANCE ) 25 MG TABS tablet Take 1 tablet (25 mg total) by mouth daily. 90 tablet 3   fenofibrate  (TRICOR ) 145 MG tablet  Take 1 tablet (145 mg total) by mouth daily. 90 tablet 3   glipiZIDE  (GLUCOTROL ) 10 MG tablet Take 1 tablet (10 mg total) by mouth 2 (two) times daily before a meal. 360 tablet 3   glucose blood (ACCU-CHEK GUIDE) test strip 1 each by Other route in the morning and at bedtime. Use as instructed 200 each 3   insulin  degludec (TRESIBA  FLEXTOUCH) 100 UNIT/ML FlexTouch Pen Inject 20 Units into the skin daily. 15 mL 4   Insulin  Pen Needle 32G X 4 MM MISC 1 Device by Does not apply route daily in the afternoon. 100 each 3   losartan  (COZAAR ) 100 MG tablet TAKE 1 TABLET BY MOUTH DAILY 90 tablet 4   metFORMIN  (GLUCOPHAGE -XR) 500 MG 24 hr  tablet TAKE 2 TABLETS(1000 MG) BY MOUTH DAILY WITH BREAKFAST 180 tablet 1   Multiple Vitamins-Minerals (MULTIVITAMIN GUMMIES ADULT) CHEW Chew 1 tablet by mouth daily.     pioglitazone  (ACTOS ) 15 MG tablet Take 1 tablet (15 mg total) by mouth daily. 90 tablet 3   rosuvastatin  (CRESTOR ) 40 MG tablet Take 1 tablet (40 mg total) by mouth daily. 90 tablet 3   traZODone  (DESYREL ) 50 MG tablet Take 1 tablet (50 mg total) by mouth at bedtime as needed for sleep. 90 tablet 0   chlorhexidine  (PERIDEX ) 0.12 % solution SMARTSIG:By Mouth     ibuprofen (ADVIL) 800 MG tablet Take 800 mg by mouth every 6 (six) hours as needed.     [name] facility-administered medications prior to visit.     Per HPI unless specifically indicated in ROS section below Review of Systems  Objective:  BP 136/80   Pulse 83   Temp 98.6 F (37 C) (Oral)   Ht 6' 1.5" (1.867 m)   Wt 252 lb 4 oz (114.4 kg)   SpO2 95%   BMI 32.83 kg/m   Wt Readings from Last 3 Encounters:  03/17/24 252 lb 4 oz (114.4 kg)  01/20/24 250 lb (113.4 kg)  09/08/23 248 lb 2 oz (112.5 kg)      Physical Exam Vitals and nursing note reviewed.  Constitutional:      Appearance: Normal appearance. He is not ill-appearing.  HENT:     Head: Normocephalic and atraumatic.  Eyes:     Extraocular Movements: Extraocular movements intact.     Conjunctiva/sclera: Conjunctivae normal.     Pupils: Pupils are equal, round, and reactive to light.  Cardiovascular:     Rate and Rhythm: Normal rate and regular rhythm.     Pulses: Normal pulses.     Heart sounds: Normal heart sounds. [name] murmur heard. Pulmonary:     Effort: Pulmonary effort is normal. [name] respiratory distress.     Breath sounds: Normal breath sounds. [name] wheezing, rhonchi or rales.  Musculoskeletal:     Right lower leg: [name] edema.     Left lower leg: [name] edema.     Comments:  See HPI for foot exam if done L elbow WNL  [name] point tenderness to palpation of R elbow anatomy (olecranon, epicondyles) but  there is nonspecific tenderness throughout lateral elbow area from mid forearm to proximal to elbow. FROM elbow flexion/extension but stiff movements with increased pain noted. Reproducible pain with hand grip, wrist extension pronation and supination against resistance  Skin:    General: Skin is warm and dry.     Findings: [name] rash.  Neurological:     Mental Status: He is alert.  Psychiatric:  Mood and Affect: Mood normal.        Behavior: Behavior normal.       Results for orders placed or performed in visit on 01/20/24  POCT glycosylated hemoglobin (Hb A1C)   Collection Time: 01/20/24  9:34 AM  Result Value Ref Range   Hemoglobin A1C 8.9 (A) 4.0 - 5.6 %   HbA1c POC (<> result, manual entry)     HbA1c, POC (prediabetic range)     HbA1c, POC (controlled diabetic range)      Assessment & Plan:   Problem List Items Addressed This Visit     Essential hypertension   Chronic, stable. Continue current regimen.       Type 2 diabetes mellitus with other specified complication (HCC)   Chronic, followed by endo. See above.       Dyslipidemia associated with type 2 diabetes mellitus (HCC)   Chronic, doing better on Crestor  40mg  daily + CoQ10 100mg  daily - continue this along with fibrate. Atorvastatin  worsened myalgias/arthralgias       Type 2 diabetes mellitus with hyperglycemia, with long-term current use of insulin  (HCC) - Primary   Chronic, uncontrolled, sees endo. Most recently glipizide  increased, actos  started.  He prefers to avoid GLP1RA.       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  Rec voltaren gel TID, ok to continue ibuprofen 600mg  nightly, use wrist brace to avoid recurrent wrist extension, and SM f/u if not improved with this.       Microalbuminuria due to type 2 diabetes mellitus (HCC)   Reviewed recent [facility] lab error - he's actually had microalbuminuria for the past year, it is improving. He will continue  losartan  100mg  and jardiance  25mg  daily. Encouraged ongoing efforts to optimize glycemic control to prevent kidney function deterioration.         [name] orders of the defined types were placed in this encounter.   [name] orders of the defined types were placed in this encounter.   Patient Instructions  May use voltaren gel topically pea sized amount up to 3 times a day. Consider using wrist brace. Follow up with Dr Geralyn Knee for ongoing right elbow pain.  Clarify with Dr Rosalea Collin glipizide  dosing.  Continue Coenzyme q10 supplement as up to now.  Good to see you today Return as needed or in 6 months for physical.   Follow up plan: Return in about 6 months (around 09/17/2024) for annual exam, prior fasting for blood work.  Claire Crick, MD

## 2024-03-17 NOTE — Assessment & Plan Note (Addendum)
 Chronic, stable. Continue current regimen.

## 2024-03-17 NOTE — Telephone Encounter (Signed)
 Plz notify pt - I touched base with Dr Rosalea Collin - she wants pt taking glipizide  10mg  2 tablets (20mg ) before breakfast and again 2 tablets before dinner. She has updated Rx to pharmacy.

## 2024-03-17 NOTE — Telephone Encounter (Signed)
Lvm asking pt to call back.  Need to relay Dr. G's message.  

## 2024-03-17 NOTE — Patient Instructions (Addendum)
 May use voltaren gel topically pea sized amount up to 3 times a day. Consider using wrist brace. Follow up with Dr Geralyn Knee for ongoing right elbow pain.  Clarify with Dr Rosalea Collin glipizide  dosing.  Continue Coenzyme q10 supplement as up to now.  Good to see you today Return as needed or in 6 months for physical.

## 2024-03-17 NOTE — Telephone Encounter (Signed)
 Pt seen in office today. Sees [facility] [address] [facility].   Faxed request.

## 2024-03-17 NOTE — Assessment & Plan Note (Signed)
 Reviewed recent [facility] lab error - he's actually had microalbuminuria for the past year, it is improving. He will continue losartan  100mg  and jardiance  25mg  daily. Encouraged ongoing efforts to optimize glycemic control to prevent kidney function deterioration.

## 2024-03-17 NOTE — Assessment & Plan Note (Signed)
 Chronic, followed by endo. See above.

## 2024-03-17 NOTE — Assessment & Plan Note (Signed)
 Chronic, doing better on Crestor  40mg  daily + CoQ10 100mg  daily - continue this along with fibrate. Atorvastatin  worsened myalgias/arthralgias

## 2024-03-17 NOTE — Assessment & Plan Note (Signed)
 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  Rec voltaren gel TID, ok to continue ibuprofen 600mg  nightly, use wrist brace to avoid recurrent wrist extension, and SM f/u if not improved with this.

## 2024-03-18 NOTE — Telephone Encounter (Signed)
 Spoke with pt relaying Dr Ocie Belt message and notifying pt Dr Rosalea Collin sent new rx to [facility]. Pt verbalizes understanding and expresses his thanks for the update.

## 2024-03-23 NOTE — Progress Notes (Unsigned)
     Orestes Geiman T. Lupie Sawa, MD, CAQ Sports Medicine [facility] [address] [place], [address]  Phone: [phone]  FAX: [phone]  DELAN KSIAZEK - 48 y.o. male  MRN [id number]  Date of Birth: 04/11/1976  Date: 03/24/2024  PCP: Claire Crick, MD  Referral: Claire Crick, MD  No chief complaint on file.  Subjective:   TAUHEED MCFAYDEN is a 48 y.o. very pleasant male patient with There is no height or weight on file to calculate BMI. who presents with the following:  Hernandez is a pleasant gentleman who presents with some ongoing elbow pain.  I actually saw him with some lateral epicondylitis of the right elbow in April 2024.  At that time, did do a lateral epicondylitis injection.  He has had a recurrence of right-sided elbow pain.    Review of Systems is noted in the HPI, as appropriate  Objective:   There were no vitals taken for this visit.  GEN: No acute distress; alert,appropriate. PULM: Breathing comfortably in no respiratory distress PSYCH: Normally interactive.   Laboratory and Imaging Data:  Assessment and Plan:   ***

## 2024-03-24 ENCOUNTER — Encounter: Payer: Self-pay | Admitting: Family Medicine

## 2024-03-24 ENCOUNTER — Ambulatory Visit: Admitting: Family Medicine

## 2024-03-24 VITALS — BP 130/90 | HR 87 | Temp 98.8°F | Ht 73.5 in | Wt 254.1 lb

## 2024-03-24 DIAGNOSIS — M7711 Lateral epicondylitis, right elbow: Secondary | ICD-10-CM | POA: Diagnosis not present

## 2024-03-24 MED ORDER — TRIAMCINOLONE ACETONIDE 40 MG/ML IJ SUSP
20.0000 mg | Freq: Once | INTRAMUSCULAR | Status: AC
  Administered 2024-03-24: 20 mg via INTRA_ARTICULAR

## 2024-03-24 NOTE — Addendum Note (Signed)
 Addended by: Wyn Heater on: 03/24/2024 10:37 AM   Modules accepted: Orders

## 2024-03-24 NOTE — Patient Instructions (Signed)
 Voltaren 1% gel, over the counter ?You can apply up to 4 times a day ? ?This can be applied to any joint: knee, wrist, fingers, elbows, shoulders, feet and ankles. ?Can apply to any tendon: tennis elbow, achilles, tendon, rotator cuff or any other tendon. ? ?Minimal is absorbed in the bloodstream: ok with oral anti-inflammatory or a blood thinner. ? ?Cost is about 9 dollars  ?

## 2024-03-29 ENCOUNTER — Other Ambulatory Visit

## 2024-04-01 NOTE — Therapy (Addendum)
 OUTPATIENT PHYSICAL THERAPY SHOULDER/ELBOW EVALUATION   Patient Name: Kyle [name] MRN: [id number] DOB:12-23-1975, 48 y.o., male Today's Date: 04/05/2024  END OF SESSION:  PT End of Session - 04/05/24 0841     Visit Number 1    Number of Visits 4    Date for PT Re-Evaluation 06/05/24    PT Start Time 0745    PT Stop Time 0830    PT Time Calculation (min) 45 min    Activity Tolerance Patient tolerated treatment well    Behavior During Therapy Kyle [name] for tasks assessed/performed             Past Medical History:  Diagnosis Date   COVID-19 11/09/2019   Diabetes type 2, controlled (HCC)    Dr Kyle [name]   Dyslipidemia 05/25/2014   Family history of adverse reaction to anesthesia    Mother has postoperatve N&V   GERD 05/11/2007   Gout, unspecified 09/05/2009   History of kidney stones    h/o kidney stones "regularly over the past 5 years"   HTN (hypertension) 09/08/2008   IBS 01/08/2008   SLEEP APNEA 01/08/2008   cannot tolerate CPAP   Sleep apnea    Past Surgical History:  Procedure Laterality Date   COLONOSCOPY     HERNIA REPAIR     NASAL SEPTOPLASTY W/ TURBINOPLASTY Bilateral 08/28/2018   Procedure: NASAL SEPTOPLASTY WITH TURBINATE REDUCTION;  Surgeon: Kyle Golas, MD;  Location: [facility] OR;  Service: ENT;  Laterality: Bilateral;   UPPER GASTROINTESTINAL ENDOSCOPY     Patient Active Problem List   Diagnosis Date Noted   Right elbow pain 03/17/2024   Microalbuminuria due to type 2 diabetes mellitus (HCC) 03/17/2024   Hypertriglyceridemia 04/02/2022   Type 2 diabetes mellitus with hyperglycemia, with long-term current use of insulin  (HCC) 04/02/2022   CAD (coronary artery disease) 10/22/2021   Adjustment disorder with mixed anxiety and depressed mood 07/17/2020   Left flank pain 01/01/2019   Haglund's deformity of both heels 01/01/2019   Vitamin D  deficiency 06/30/2018   Fatigue 04/07/2018   History of kidney stones 07/01/2017   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    Health maintenance examination 05/25/2014   Dyslipidemia associated with type 2 diabetes mellitus (HCC) 05/25/2014   Type 2 diabetes mellitus with other specified complication (HCC) 08/27/2013   Obesity, Class I, BMI 30.0-34.9 (see actual BMI) 06/02/2013   Gout 09/05/2009   Essential hypertension 01/08/2008   OSA (obstructive sleep apnea) 01/08/2008   GERD 05/11/2007    PCP: Kyle Crick, MD   REFERRING PROVIDER: Scherrie Curt, MD  REFERRING DIAG: M77.11 (ICD-10-CM) - Lateral epicondylitis of right elbow  THERAPY DIAG:  Lateral epicondylitis of right elbow  Rationale for Evaluation and Treatment: Rehabilitation  ONSET DATE: 01/2023  SUBJECTIVE:  SUBJECTIVE STATEMENT: Patient reports a return of R elbow pain.  Had an episode 18 months ago which resolved with treatment.  Recent CSI helped but continues with a decreased amount of pain.   Hand dominance: Right  PERTINENT HISTORY: Kyle [name] is a 48 y.o. very pleasant male patient with Body mass index is 33.07 kg/m. who presents with the following:   Kyle [name] is a pleasant gentleman who presents with some ongoing elbow pain.  I actually saw him with some lateral epicondylitis of the right elbow in April 2024.  At that time, did do a lateral epicondylitis injection.   He has had a recurrence of right-sided elbow pain.   After injection, felt better quickly. Three months ago started to hurt really bad Med changes and CoQ 10 has not helped   He has started get some pain with terminal medicine and flexion and extension at the elbow.  He also has pain with flexion extension at the wrist. The topical pain with palpation is variable, but does have pain with active movement such as lifting anything even things that are such as light as a coffee  cup.  PAIN:  Are you having pain? Yes: NPRS scale: 2/10 Pain location: R elbow Pain description: ache Aggravating factors: grasping Relieving factors: rest/meds/stretching   PRECAUTIONS: None  RED FLAGS: None   WEIGHT BEARING RESTRICTIONS: No  FALLS:  Has patient fallen in last 6 months? No  OCCUPATION: Office work  PLOF: Independent  PATIENT GOALS:To manage my elbow pain  NEXT MD VISIT:   OBJECTIVE:  Note: Objective measures were completed at Evaluation unless otherwise noted.  DIAGNOSTIC FINDINGS:  none  PATIENT SURVEYS:  Quick Dash 75%   UPPER EXTREMITY ROM: WNL  Active ROM Right eval Left eval  Shoulder flexion    Shoulder extension    Shoulder abduction    Shoulder adduction    Shoulder internal rotation    Shoulder external rotation    Elbow flexion    Elbow extension    Wrist flexion    Wrist extension    Wrist ulnar deviation    Wrist radial deviation    Wrist pronation    Wrist supination    (Blank rows = not tested)  UPPER EXTREMITY MMT:  MMT Right eval Left eval  Shoulder flexion    Shoulder extension    Shoulder abduction    Shoulder adduction    Shoulder internal rotation    Shoulder external rotation    Middle trapezius    Lower trapezius    Elbow flexion    Elbow extension    Wrist flexion    Wrist extension    Wrist ulnar deviation    Wrist radial deviation    Wrist pronation    Wrist supination    Grip strength (lbs) 85 80  Key pinch 24 24  (Blank rows = not tested)   JOINT MOBILITY TESTING:  Unremarkable   PALPATION:  TTP Common wrist extensor group  TREATMENT DATE:  [name] Adult PT Treatment:                                                DATE: 04/05/24 Eval and HEP Self Care: Additional minutes spent for educating on updated Therapeutic Home Exercise Program as well as comparing  current status to condition at start of symptoms. This included exercises focusing on stretching, strengthening, with focus on eccentric aspects. Long term goals include an improvement in range of motion, strength, endurance as well as avoiding reinjury. Patient's frequency would include in 1-2 times a day, 3-5 times a week for a duration of 6-12 weeks. Proper technique shown and discussed handout in great detail. All questions were discussed and addressed.      PATIENT EDUCATION: Education details: Discussed eval findings, rehab rationale and POC and patient is in agreement  Person educated: Patient Education method: Explanation and Handouts Education comprehension: verbalized understanding and needs further education  HOME EXERCISE PROGRAM: Access Code: [email or web address] URL: [email or web address] Date: 04/05/2024 Prepared by: Gretta Leavens  Exercises - Standing Wrist Extension Stretch  - 2 x daily - 5 x weekly - 1 sets - 2 reps - 30s hold - Seated Wrist Flexion with Dumbbell  - 2 x daily - 5 x weekly - 1 sets - 30 reps - 30s hold - Seated Wrist Extension with Dumbbell  - 2 x daily - 5 x weekly - 1 sets - 10 reps - 30s hold  ASSESSMENT:  CLINICAL IMPRESSION: Patient is a 48 y.o. male who was seen today for physical therapy evaluation and treatment for Chronic R elbow pain.  Patient presents with full ROM as well as grip/pinch strength.  Palpation finds point tenderness to common extensor group muscle belly.  Patient presents with S&S of trigger point pain in R wrist extensors.   OBJECTIVE IMPAIRMENTS: decreased activity tolerance, decreased knowledge of condition, impaired perceived functional ability, increased muscle spasms, and pain.   ACTIVITY LIMITATIONS: carrying, lifting, and bed mobility  PERSONAL FACTORS: Age, Past/current experiences, and Time since onset of injury/illness/exacerbation are also affecting patient's functional outcome.   REHAB POTENTIAL:  Good  CLINICAL DECISION MAKING: Stable/uncomplicated  EVALUATION COMPLEXITY: Low   GOALS: Goals reviewed with patient? No  SHORT TERM GOALS=LONG TERM GOALS: Target date: 05/31/2024    Patient will acknowledge 1/10 pain at least once during episode of care   Baseline: 2/10 Goal status: INITIAL  2.  Patient will score at least 85% on QDASH to signify clinically meaningful improvement in functional abilities.   Baseline: 75% Goal status: INITIAL  3.  Patient to demonstrate independence in HEP  Baseline: [email or web address] Goal status: INITIAL  4.  Minimal TTP R wrist extensor group Baseline: moderate TTP Goal status: INITIAL    PLAN:  PT FREQUENCY: 1-2x/week  PT DURATION: 6 weeks  PLANNED INTERVENTIONS: 97110-Therapeutic exercises, 97530- Therapeutic activity, 97112- Neuromuscular re-education, 97535- Self Care, 04540- Manual therapy, 97760- Splinting, 97035- Ultrasound, 98119- Ionotophoresis 4mg /ml Dexamethasone , Patient/Family education, and Joint mobilization  PLAN FOR NEXT SESSION: HEP review and update, manual techniques as appropriate, aerobic tasks, ROM and flexibility activities, strengthening and PREs, TPDN, gait and balance training as needed     Eldon Greenland, PT 04/05/2024, 11:37 AM

## 2024-04-05 ENCOUNTER — Ambulatory Visit: Attending: Family Medicine

## 2024-04-05 ENCOUNTER — Other Ambulatory Visit: Payer: Self-pay

## 2024-04-05 DIAGNOSIS — M7711 Lateral epicondylitis, right elbow: Secondary | ICD-10-CM | POA: Insufficient documentation

## 2024-04-05 DIAGNOSIS — M6281 Muscle weakness (generalized): Secondary | ICD-10-CM | POA: Diagnosis present

## 2024-04-13 NOTE — Therapy (Unsigned)
 OUTPATIENT PHYSICAL THERAPY TREATMENT NOTE   Patient Name: Kyle [name] MRN: [id number] DOB:Dec 06, 1975, 48 y.o., male Today's Date: 04/15/2024  END OF SESSION:  PT End of Session - 04/15/24 0749     Visit Number 2    Number of Visits 4    Date for PT Re-Evaluation 06/05/24    Authorization Time Period [employer]    PT Start Time 0745    PT Stop Time 0825    PT Time Calculation (min) 40 min    Activity Tolerance Patient tolerated treatment well    Behavior During Therapy [facility] for tasks assessed/performed           Past Medical History:  Diagnosis Date   COVID-19 11/09/2019   Diabetes type 2, controlled (HCC)    Dr Lorelei Rogers   Dyslipidemia 05/25/2014   Family history of adverse reaction to anesthesia    Mother has postoperatve N&V   GERD 05/11/2007   Gout, unspecified 09/05/2009   History of kidney stones    h/o kidney stones regularly over the past 5 years   HTN (hypertension) 09/08/2008   IBS 01/08/2008   SLEEP APNEA 01/08/2008   cannot tolerate CPAP   Sleep apnea    Past Surgical History:  Procedure Laterality Date   COLONOSCOPY     HERNIA REPAIR     NASAL SEPTOPLASTY W/ TURBINOPLASTY Bilateral 08/28/2018   Procedure: NASAL SEPTOPLASTY WITH TURBINATE REDUCTION;  Surgeon: Vernadine Golas, MD;  Location: [facility] OR;  Service: ENT;  Laterality: Bilateral;   UPPER GASTROINTESTINAL ENDOSCOPY     Patient Active Problem List   Diagnosis Date Noted   Right elbow pain 03/17/2024   Microalbuminuria due to type 2 diabetes mellitus (HCC) 03/17/2024   Hypertriglyceridemia 04/02/2022   Type 2 diabetes mellitus with hyperglycemia, with long-term current use of insulin  (HCC) 04/02/2022   CAD (coronary artery disease) 10/22/2021   Adjustment disorder with mixed anxiety and depressed mood 07/17/2020   Left flank pain 01/01/2019   Haglund's deformity of both heels 01/01/2019   Vitamin D  deficiency 06/30/2018   Fatigue 04/07/2018   History of kidney stones 07/01/2017   Floppy eyelid  syndrome 06/27/2016   Health maintenance examination 05/25/2014   Dyslipidemia associated with type 2 diabetes mellitus (HCC) 05/25/2014   Type 2 diabetes mellitus with other specified complication (HCC) 08/27/2013   Obesity, Class I, BMI 30.0-34.9 (see actual BMI) 06/02/2013   Gout 09/05/2009   Essential hypertension 01/08/2008   OSA (obstructive sleep apnea) 01/08/2008   GERD 05/11/2007    PCP: Claire Crick, MD   REFERRING PROVIDER: Scherrie Curt, MD  REFERRING DIAG: M77.11 (ICD-10-CM) - Lateral epicondylitis of right elbow  THERAPY DIAG:  Lateral epicondylitis of right elbow  Muscle weakness (generalized)  Rationale for Evaluation and Treatment: Rehabilitation  ONSET DATE: 01/2023  SUBJECTIVE:  SUBJECTIVE STATEMENT: Overall better, symptoms 2/10 in intensity.  Has been compliant with HEP. Hand dominance: Right  PERTINENT HISTORY: Kyle [name] is a 48 y.o. very pleasant male patient with Body mass index is 33.07 kg/m. who presents with the following:   Lenzy is a pleasant gentleman who presents with some ongoing elbow pain.  I actually saw him with some lateral epicondylitis of the right elbow in April 2024.  At that time, did do a lateral epicondylitis injection.   He has had a recurrence of right-sided elbow pain.   After injection, felt better quickly. Three months ago started to hurt really bad Med changes and CoQ 10 has not helped   He has started get some pain with terminal medicine and flexion and extension at the elbow.  He also has pain with flexion extension at the wrist. The topical pain with palpation is variable, but does have pain with active movement such as lifting anything even things that are such as light as a coffee cup.  PAIN:  Are you having pain? Yes: NPRS  scale: 2/10 Pain location: R elbow Pain description: ache Aggravating factors: grasping Relieving factors: rest/meds/stretching   PRECAUTIONS: None  RED FLAGS: None   WEIGHT BEARING RESTRICTIONS: No  FALLS:  Has patient fallen in last 6 months? No  OCCUPATION: Office work  PLOF: Independent  PATIENT GOALS:To manage my elbow pain  NEXT MD VISIT:   OBJECTIVE:  Note: Objective measures were completed at Evaluation unless otherwise noted.  DIAGNOSTIC FINDINGS:  none  PATIENT SURVEYS:  Quick Dash 75%   UPPER EXTREMITY ROM: WNL  Active ROM Right eval Left eval  Shoulder flexion    Shoulder extension    Shoulder abduction    Shoulder adduction    Shoulder internal rotation    Shoulder external rotation    Elbow flexion    Elbow extension    Wrist flexion    Wrist extension    Wrist ulnar deviation    Wrist radial deviation    Wrist pronation    Wrist supination    (Blank rows = not tested)  UPPER EXTREMITY MMT:  MMT Right eval Left eval  Shoulder flexion    Shoulder extension    Shoulder abduction    Shoulder adduction    Shoulder internal rotation    Shoulder external rotation    Middle trapezius    Lower trapezius    Elbow flexion    Elbow extension    Wrist flexion    Wrist extension    Wrist ulnar deviation    Wrist radial deviation    Wrist pronation    Wrist supination    Grip strength (lbs) 85 80  Key pinch 24 24  (Blank rows = not tested)   JOINT MOBILITY TESTING:  Unremarkable   PALPATION:  TTP Common wrist extensor group  TREATMENT DATE:  [facility] Adult PT Treatment:                                                DATE: 04/15/24 Therapeutic Exercise: UBE L1 3/3 min Manual Therapy: STM to common extensor group R radial head mobs Therapeutic Activity: R wrist flex/ext with yellow wrist roller 15x2 ea. R  wrist pro/sup with yellow wrist roller 15x2 ea. R wrist rad/uln deviation with yellow wrist roller 15x2 ea.  [facility] Adult PT Treatment:                                                DATE: 04/05/24 Eval and HEP Self Care: Additional minutes spent for educating on updated Therapeutic Home Exercise Program as well as comparing current status to condition at start of symptoms. This included exercises focusing on stretching, strengthening, with focus on eccentric aspects. Long term goals include an improvement in range of motion, strength, endurance as well as avoiding reinjury. Patient's frequency would include in 1-2 times a day, 3-5 times a week for a duration of 6-12 weeks. Proper technique shown and discussed handout in great detail. All questions were discussed and addressed.      PATIENT EDUCATION: Education details: Discussed eval findings, rehab rationale and POC and patient is in agreement  Person educated: Patient Education method: Explanation and Handouts Education comprehension: verbalized understanding and needs further education  HOME EXERCISE PROGRAM: Access Code: [email or web address] URL: [email or web address] Date: 04/05/2024 Prepared by: Gretta Leavens  Exercises - Standing Wrist Extension Stretch  - 2 x daily - 5 x weekly - 1 sets - 2 reps - 30s hold - Seated Wrist Flexion with Dumbbell  - 2 x daily - 5 x weekly - 1 sets - 30 reps - 30s hold - Seated Wrist Extension with Dumbbell  - 2 x daily - 5 x weekly - 1 sets - 10 reps - 30s hold  ASSESSMENT:  CLINICAL IMPRESSION: First f/u session, focused placed on manual techniques to common extensor group f/b aerobic w/u and concentric R wrist strengthening activities. Patient is a 48 y.o. male who was seen today for physical therapy evaluation and treatment for Chronic R elbow pain.  Patient presents with full ROM as well as grip/pinch strength.  Palpation finds point tenderness to common extensor group muscle belly.  Patient  presents with S&S of trigger point pain in R wrist extensors.   OBJECTIVE IMPAIRMENTS: decreased activity tolerance, decreased knowledge of condition, impaired perceived functional ability, increased muscle spasms, and pain.   ACTIVITY LIMITATIONS: carrying, lifting, and bed mobility  PERSONAL FACTORS: Age, Past/current experiences, and Time since onset of injury/illness/exacerbation are also affecting patient's functional outcome.   REHAB POTENTIAL: Good  CLINICAL DECISION MAKING: Stable/uncomplicated  EVALUATION COMPLEXITY: Low   GOALS: Goals reviewed with patient? No  SHORT TERM GOALS=LONG TERM GOALS: Target date: 05/31/2024    Patient will acknowledge 1/10 pain at least once during episode of care   Baseline: 2/10 Goal status: INITIAL  2.  Patient will score at least 85% on QDASH to signify clinically meaningful improvement in functional abilities.   Baseline: 75% Goal status: INITIAL  3.  Patient to demonstrate independence in HEP  Baseline: [email or web address] Goal status: INITIAL  4.  Minimal TTP R wrist extensor group Baseline: moderate TTP Goal status: INITIAL    PLAN:  PT FREQUENCY: 1-2x/week  PT DURATION: 6 weeks  PLANNED INTERVENTIONS: 97110-Therapeutic exercises, 97530- Therapeutic activity, [id number]- Neuromuscular re-education, 97535- Self Care, [id number]- Manual therapy, 97760- Splinting, 97035- Ultrasound, [id number]- Ionotophoresis 4mg /ml Dexamethasone , Patient/Family education, and Joint mobilization  PLAN FOR NEXT SESSION: HEP review and update, manual techniques as appropriate, aerobic tasks, ROM and flexibility activities, strengthening and PREs, TPDN, gait and balance training as needed     Eldon Greenland, PT 04/15/2024, 8:29 AM

## 2024-04-15 ENCOUNTER — Ambulatory Visit

## 2024-04-15 DIAGNOSIS — M7711 Lateral epicondylitis, right elbow: Secondary | ICD-10-CM

## 2024-04-15 DIAGNOSIS — M6281 Muscle weakness (generalized): Secondary | ICD-10-CM

## 2024-04-26 NOTE — Therapy (Unsigned)
 OUTPATIENT PHYSICAL THERAPY TREATMENT NOTE   Patient Name: WESTYN DRIGGERS MRN: [id number] DOB:03-14-1976, 48 y.o., male Today's Date: 04/28/2024  END OF SESSION:  PT End of Session - 04/28/24 0912     Visit Number 3    Number of Visits 4    Date for PT Re-Evaluation 06/05/24    Authorization Time Period [employer]    PT Start Time 0915    PT Stop Time 0955    PT Time Calculation (min) 40 min    Activity Tolerance Patient tolerated treatment well    Behavior During Therapy [facility] for tasks assessed/performed            Past Medical History:  Diagnosis Date   COVID-19 11/09/2019   Diabetes type 2, controlled (HCC)    Dr Damian   Dyslipidemia 05/25/2014   Family history of adverse reaction to anesthesia    Mother has postoperatve N&V   GERD 05/11/2007   Gout, unspecified 09/05/2009   History of kidney stones    h/o kidney stones regularly over the past 5 years   HTN (hypertension) 09/08/2008   IBS 01/08/2008   SLEEP APNEA 01/08/2008   cannot tolerate CPAP   Sleep apnea    Past Surgical History:  Procedure Laterality Date   COLONOSCOPY     HERNIA REPAIR     NASAL SEPTOPLASTY W/ TURBINOPLASTY Bilateral 08/28/2018   Procedure: NASAL SEPTOPLASTY WITH TURBINATE REDUCTION;  Surgeon: Roark Rush, MD;  Location: [facility] OR;  Service: ENT;  Laterality: Bilateral;   UPPER GASTROINTESTINAL ENDOSCOPY     Patient Active Problem List   Diagnosis Date Noted   Right elbow pain 03/17/2024   Microalbuminuria due to type 2 diabetes mellitus (HCC) 03/17/2024   Hypertriglyceridemia 04/02/2022   Type 2 diabetes mellitus with hyperglycemia, with long-term current use of insulin  (HCC) 04/02/2022   CAD (coronary artery disease) 10/22/2021   Adjustment disorder with mixed anxiety and depressed mood 07/17/2020   Left flank pain 01/01/2019   Haglund's deformity of both heels 01/01/2019   Vitamin D  deficiency 06/30/2018   Fatigue 04/07/2018   History of kidney stones 07/01/2017   Floppy eyelid  syndrome 06/27/2016   Health maintenance examination 05/25/2014   Dyslipidemia associated with type 2 diabetes mellitus (HCC) 05/25/2014   Type 2 diabetes mellitus with other specified complication (HCC) 08/27/2013   Obesity, Class I, BMI 30.0-34.9 (see actual BMI) 06/02/2013   Gout 09/05/2009   Essential hypertension 01/08/2008   OSA (obstructive sleep apnea) 01/08/2008   GERD 05/11/2007    PCP: Rilla Baller, MD   REFERRING PROVIDER: Watt Mirza, MD  REFERRING DIAG: M77.11 (ICD-10-CM) - Lateral epicondylitis of right elbow  THERAPY DIAG:  Lateral epicondylitis of right elbow  Muscle weakness (generalized)  Rationale for Evaluation and Treatment: Rehabilitation  ONSET DATE: 01/2023  SUBJECTIVE:  SUBJECTIVE STATEMENT:  Did well following last session but tweaked elbow 4 days ago.  Symptoms have subsided but not to previous level. Hand dominance: Right  PERTINENT HISTORY: ERYN KREJCI is a 48 y.o. very pleasant male patient with Body mass index is 33.07 kg/m. who presents with the following:   Ryden is a pleasant gentleman who presents with some ongoing elbow pain.  I actually saw him with some lateral epicondylitis of the right elbow in April 2024.  At that time, did do a lateral epicondylitis injection.   He has had a recurrence of right-sided elbow pain.   After injection, felt better quickly. Three months ago started to hurt really bad Med changes and CoQ 10 has not helped   He has started get some pain with terminal medicine and flexion and extension at the elbow.  He also has pain with flexion extension at the wrist. The topical pain with palpation is variable, but does have pain with active movement such as lifting anything even things that are such as light as a coffee  cup.  PAIN:  Are you having pain? Yes: NPRS scale: 2/10 Pain location: R elbow Pain description: ache Aggravating factors: grasping Relieving factors: rest/meds/stretching   PRECAUTIONS: None  RED FLAGS: None   WEIGHT BEARING RESTRICTIONS: No  FALLS:  Has patient fallen in last 6 months? No  OCCUPATION: Office work  PLOF: Independent  PATIENT GOALS:To manage my elbow pain  NEXT MD VISIT:   OBJECTIVE:  Note: Objective measures were completed at Evaluation unless otherwise noted.  DIAGNOSTIC FINDINGS:  none  PATIENT SURVEYS:  Quick Dash 75%   UPPER EXTREMITY ROM: WNL  Active ROM Right eval Left eval  Shoulder flexion    Shoulder extension    Shoulder abduction    Shoulder adduction    Shoulder internal rotation    Shoulder external rotation    Elbow flexion    Elbow extension    Wrist flexion    Wrist extension    Wrist ulnar deviation    Wrist radial deviation    Wrist pronation    Wrist supination    (Blank rows = not tested)  UPPER EXTREMITY MMT:  MMT Right eval Left eval  Shoulder flexion    Shoulder extension    Shoulder abduction    Shoulder adduction    Shoulder internal rotation    Shoulder external rotation    Middle trapezius    Lower trapezius    Elbow flexion    Elbow extension    Wrist flexion    Wrist extension    Wrist ulnar deviation    Wrist radial deviation    Wrist pronation    Wrist supination    Grip strength (lbs) 85 80  Key pinch 24 24  (Blank rows = not tested)   JOINT MOBILITY TESTING:  Unremarkable   PALPATION:  TTP Common wrist extensor group  TREATMENT DATE:  [facility] Adult PT Treatment:                                                DATE: 04/28/24 Therapeutic Exercise: UBE L2 3/3 min Manual Therapy: STM R wrist extensors and radial head Skilled palpation to identify taught and  irritable bands in R wrist extensor group Trigger Point Dry Needling  Initial Treatment: Pt instructed on Dry Needling rational, procedures, and possible side effects. Pt instructed to expect mild to moderate muscle soreness later in the day and/or into the next day.  Pt instructed in methods to reduce muscle soreness. Pt instructed to continue prescribed HEP. Patient was educated on signs and symptoms of infection and other risk factors and advised to seek medical attention should they occur.  Patient verbalized understanding of these instructions and education.   Patient Verbal Consent Given: Yes Education Handout Provided: Yes Muscles Treated: R wrist extensors Electrical Stimulation Performed: No Treatment Response/Outcome: twitch response elicited.     Therapeutic Activity: R wrist flex/ext with yellow wrist roller 15x2 ea. R wrist pro/sup with yellow wrist roller 15x2 ea. R wrist rad/uln deviation with yellow wrist roller 15x2 ea.  [facility] Adult PT Treatment:                                                DATE: 04/15/24 Therapeutic Exercise: UBE L1 3/3 min Manual Therapy: STM to common extensor group R radial head mobs Therapeutic Activity: R wrist flex/ext with yellow wrist roller 15x2 ea. R wrist pro/sup with yellow wrist roller 15x2 ea. R wrist rad/uln deviation with yellow wrist roller 15x2 ea.  [facility] Adult PT Treatment:                                                DATE: 04/05/24 Eval and HEP Self Care: Additional minutes spent for educating on updated Therapeutic Home Exercise Program as well as comparing current status to condition at start of symptoms. This included exercises focusing on stretching, strengthening, with focus on eccentric aspects. Long term goals include an improvement in range of motion, strength, endurance as well as avoiding reinjury. Patient's frequency would include in 1-2 times a day, 3-5 times a week for a duration of 6-12 weeks. Proper technique  shown and discussed handout in great detail. All questions were discussed and addressed.      PATIENT EDUCATION: Education details: Discussed eval findings, rehab rationale and POC and patient is in agreement  Person educated: Patient Education method: Explanation and Handouts Education comprehension: verbalized understanding and needs further education  HOME EXERCISE PROGRAM: Access Code: [email or web address] URL: [email or web address] Date: 04/05/2024 Prepared by: Nhyla Nappi  Exercises - Standing Wrist Extension Stretch  - 2 x daily - 5 x weekly - 1 sets - 2 reps - 30s hold - Seated Wrist Flexion with Dumbbell  - 2 x daily - 5 x weekly - 1 sets - 30 reps - 30s hold - Seated Wrist Extension with Dumbbell  - 2 x daily - 5 x weekly - 1 sets - 10 reps - 30s hold  ASSESSMENT:  CLINICAL IMPRESSION: Good response to previous treatment but symptoms returned following an awkward movement.  Patient agreeable to trial of TPDN to R forearm extensors.  Good response elicited following technique.  Patient to follow up 1w2 to assess benefit of DN    Patient is a 48 y.o. male who was seen today for physical therapy evaluation and treatment for Chronic R elbow pain.  Patient presents with full ROM as well as grip/pinch strength.  Palpation finds point tenderness to common extensor group muscle belly.  Patient presents with S&S of trigger point pain in R wrist extensors.   OBJECTIVE IMPAIRMENTS: decreased activity tolerance, decreased knowledge of condition, impaired perceived functional ability, increased muscle spasms, and pain.   ACTIVITY LIMITATIONS: carrying, lifting, and bed mobility  PERSONAL FACTORS: Age, Past/current experiences, and Time since onset of injury/illness/exacerbation are also affecting patient's functional outcome.   REHAB POTENTIAL: Good  CLINICAL DECISION MAKING: Stable/uncomplicated  EVALUATION COMPLEXITY: Low   GOALS: Goals reviewed with patient? No  SHORT  TERM GOALS=LONG TERM GOALS: Target date: 05/31/2024    Patient will acknowledge 1/10 pain at least once during episode of care   Baseline: 2/10 Goal status: INITIAL  2.  Patient will score at least 85% on QDASH to signify clinically meaningful improvement in functional abilities.   Baseline: 75% Goal status: INITIAL  3.  Patient to demonstrate independence in HEP  Baseline: [email or web address] Goal status: INITIAL  4.  Minimal TTP R wrist extensor group Baseline: moderate TTP Goal status: INITIAL    PLAN:  PT FREQUENCY: 1-2x/week  PT DURATION: 6 weeks  PLANNED INTERVENTIONS: 97110-Therapeutic exercises, 97530- Therapeutic activity, [id number]- Neuromuscular re-education, 97535- Self Care, 02859- Manual therapy, 97760- Splinting, 97035- Ultrasound, 02966- Ionotophoresis 4mg /ml Dexamethasone , Patient/Family education, and Joint mobilization  PLAN FOR NEXT SESSION: HEP review and update, manual techniques as appropriate, aerobic tasks, ROM and flexibility activities, strengthening and PREs, TPDN, gait and balance training as needed     Reyes CHRISTELLA Kohut, PT 04/28/2024, 10:13 AM

## 2024-04-28 ENCOUNTER — Ambulatory Visit: Attending: Family Medicine

## 2024-04-28 DIAGNOSIS — M6281 Muscle weakness (generalized): Secondary | ICD-10-CM | POA: Diagnosis present

## 2024-04-28 DIAGNOSIS — M7711 Lateral epicondylitis, right elbow: Secondary | ICD-10-CM | POA: Insufficient documentation

## 2024-04-28 NOTE — Patient Instructions (Signed)

## 2024-05-05 ENCOUNTER — Ambulatory Visit: Admitting: Internal Medicine

## 2024-05-05 ENCOUNTER — Encounter: Payer: Self-pay | Admitting: Internal Medicine

## 2024-05-05 VITALS — BP 130/80 | HR 95 | Ht 73.5 in | Wt 250.0 lb

## 2024-05-05 DIAGNOSIS — Z7984 Long term (current) use of oral hypoglycemic drugs: Secondary | ICD-10-CM | POA: Diagnosis not present

## 2024-05-05 DIAGNOSIS — E781 Pure hyperglyceridemia: Secondary | ICD-10-CM | POA: Diagnosis not present

## 2024-05-05 DIAGNOSIS — Z794 Long term (current) use of insulin: Secondary | ICD-10-CM | POA: Diagnosis not present

## 2024-05-05 DIAGNOSIS — E1165 Type 2 diabetes mellitus with hyperglycemia: Secondary | ICD-10-CM

## 2024-05-05 LAB — POCT GLYCOSYLATED HEMOGLOBIN (HGB A1C): Hemoglobin A1C: 8.6 % — AB (ref 4.0–5.6)

## 2024-05-05 LAB — POCT GLUCOSE (DEVICE FOR HOME USE): POC Glucose: 167 mg/dL — AB (ref 70–99)

## 2024-05-05 MED ORDER — TRESIBA FLEXTOUCH 100 UNIT/ML ~~LOC~~ SOPN: 24.0000 [IU] | PEN_INJECTOR | Freq: Every day | SUBCUTANEOUS | 4 refills | Status: AC

## 2024-05-05 MED ORDER — PIOGLITAZONE HCL 30 MG PO TABS: 30.0000 mg | ORAL_TABLET | Freq: Every day | ORAL | 3 refills | Status: AC

## 2024-05-05 NOTE — Patient Instructions (Signed)
-   Continue  Glipizide  10 mg, 2 tablet before first meal of the day  and 2 tablet before Last meal of the day  - Increase  Pioglitazone  ( Actos ) 30 mg, 1 tablet daily  - Continue  Jardiance  25 mg daily - Continue Metformin  500 mg, two tablets daily  - Increase Tresiba  24 units daily   Check out Tandem insulin  pump    HOW TO TREAT LOW BLOOD SUGARS (Blood sugar LESS THAN 70 MG/DL) Please follow the RULE OF 15 for the treatment of hypoglycemia treatment (when your (blood sugars are less than 70 mg/dL)   STEP 1: Take 15 grams of carbohydrates when your blood sugar is low, which includes:  3-4 GLUCOSE TABS  OR 3-4 OZ OF JUICE OR REGULAR SODA OR ONE TUBE OF GLUCOSE GEL    STEP 2: RECHECK blood sugar in 15 MINUTES STEP 3: If your blood sugar is still low at the 15 minute recheck --> then, go back to STEP 1 and treat AGAIN with another 15 grams of carbohydrates.

## 2024-05-05 NOTE — Progress Notes (Signed)
 Name: Kyle [name]  MRN/ DOB: [id number], [birth year]   Age/ Sex: 48 y.o., male    PCP: Rilla Baller, MD   Reason for Endocrinology Evaluation: Type 2 Diabetes Mellitus     Date of Initial Endocrinology Visit: 04/02/2022    PATIENT IDENTIFIER: [name] is a 48 y.o. male with a past medical history of T2DM, Dyslipidemia, CAD and OSA. The patient presented for initial endocrinology clinic visit on 04/02/2022 for consultative assistance with his diabetes management.    HPI: [name] was    Diagnosed with DM in 2018 Prior Medications tried/Intolerance: Intolerant to higher doses of metformin  . Was on insulin  at some point.            Hemoglobin A1c has ranged from 6.8% in 2018, peaking at 11.5% in 2022.  No prior dx of pancreatitis  Has chronic GI issues with alternating bowel movement    On his initial visit to our clinic his A1c was 9.1%, he was on Jardiance  and metformin  which we continued and added glipizide   He was started on insulin  by December 2023 due to persistent hyperglycemia   Started pioglitazone  12/2023 with an A1c of 8.9% SUBJECTIVE:   During the last visit (01/20/2024): A1C 8.9%    Today (05/05/24): [name] is here for follow-up on diabetes management.  He checks his blood sugars 2x weekly .  The patient has not had hypoglycemic episodes.   Has noted hyperglycemia at times in the afternoon 300-400 mg/dL that he attributes to dehydration   He does have difficulty taking the evening dose of glycemic agents  Denies nausea, or vomiting  Continues with chronic diarrhea since childhood alternating with constipation  which has been improving  Joint aches and pains have improved with rosuvastatin      HOME DIABETES REGIMEN: Pioglitazone  15 mg daily Jardiance  25 mg daily  Metformin  500 mg 2 tabs daily  Glipizide  10 mg , 2 tabs BID Tresiba   20 units daily Rosuvastatin  40 mg daily Fenofibrate  145 mg daily   Statin: yes ACE-I/ARB:  yes    METER DOWNLOAD SUMMARY:  n/a    DIABETIC COMPLICATIONS: Microvascular complications:   Denies: CKD, neuropathy, retinopathy  Last eye exam: Completed 2023  Macrovascular complications:  CAD Denies:  PVD, CVA   PAST HISTORY: Past Medical History:  Past Medical History:  Diagnosis Date   COVID-19 11/09/2019   Diabetes type 2, controlled (HCC)    Dr Damian   Dyslipidemia 05/25/2014   Family history of adverse reaction to anesthesia    Mother has postoperatve N&V   GERD 05/11/2007   Gout, unspecified 09/05/2009   History of kidney stones    h/o kidney stones regularly over the past 5 years   HTN (hypertension) 09/08/2008   IBS 01/08/2008   SLEEP APNEA 01/08/2008   cannot tolerate CPAP   Sleep apnea    Past Surgical History:  Past Surgical History:  Procedure Laterality Date   COLONOSCOPY     HERNIA REPAIR     NASAL SEPTOPLASTY W/ TURBINOPLASTY Bilateral 08/28/2018   Procedure: NASAL SEPTOPLASTY WITH TURBINATE REDUCTION;  Surgeon: Roark Rush, MD;  Location: [facility] OR;  Service: ENT;  Laterality: Bilateral;   UPPER GASTROINTESTINAL ENDOSCOPY      Social History:  reports that he quit smoking about 20 years ago. His smoking use included cigarettes. He quit smokeless tobacco use about 20 years ago. He reports current alcohol use. He reports that he does not use drugs. Family History:  Family History  Problem Relation Age of Onset   CAD Mother 20   Cancer Father 79       prostate s/p radiation seed implant   CAD Father 74   Hypothyroidism Father 37   Cancer Sister 29       cervical   Colon cancer Maternal Uncle    Cancer Maternal Uncle        colon   Stroke Other        bilateral grandfathers   Hypertension Other        strong on maternal side   CAD Other        bilateral grandfathers   Diabetes Neg Hx    Colon polyps Neg Hx    Crohn's disease Neg Hx    Esophageal cancer Neg Hx    Rectal cancer Neg Hx    Stomach cancer Neg Hx    Ulcerative colitis  Neg Hx      HOME MEDICATIONS: Allergies as of 05/05/2024       Reactions   Penicillins Swelling, Other (See Comments)   SWELLING REACTION UNSPECIFIED  Has patient had a PCN reaction causing immediate rash, facial/tongue/throat swelling, SOB or lightheadedness with hypotension: No Has patient had a PCN reaction causing severe rash involving mucus membranes or skin necrosis: No Has patient had a PCN reaction that required hospitalization: No Has patient had a PCN reaction occurring within the last 10 years: No If all of the above answers are NO, then may proceed with Cephalosporin use.        Medication List        Accurate as of May 05, 2024 10:49 AM. If you have any questions, ask your nurse or doctor.          Accu-Chek Guide test strip Generic drug: glucose blood 1 each by Other route in the morning and at bedtime. Use as instructed   allopurinol  300 MG tablet Commonly known as: ZYLOPRIM  Take 1 tablet (300 mg total) by mouth daily.   amLODipine  10 MG tablet Commonly known as: NORVASC  TAKE 1 TABLET BY MOUTH DAILY   Co-Enzyme Q10 100 MG Caps Take 1 capsule by mouth daily.   empagliflozin  25 MG Tabs tablet Commonly known as: JARDIANCE  Take 1 tablet (25 mg total) by mouth daily.   fenofibrate  145 MG tablet Commonly known as: Tricor  Take 1 tablet (145 mg total) by mouth daily.   glipiZIDE  10 MG tablet Commonly known as: GLUCOTROL  Take 2 tablets (20 mg total) by mouth 2 (two) times daily before a meal.   Insulin  Pen Needle 32G X 4 MM Misc 1 Device by Does not apply route daily in the afternoon.   losartan  100 MG tablet Commonly known as: COZAAR  TAKE 1 TABLET BY MOUTH DAILY   metFORMIN  500 MG 24 hr tablet Commonly known as: GLUCOPHAGE -XR TAKE 2 TABLETS(1000 MG) BY MOUTH DAILY WITH BREAKFAST   Multivitamin Gummies Adult Chew Chew 1 tablet by mouth daily.   pioglitazone  15 MG tablet Commonly known as: Actos  Take 1 tablet (15 mg total) by mouth daily.    rosuvastatin  40 MG tablet Commonly known as: CRESTOR  Take 1 tablet (40 mg total) by mouth daily.   traZODone  50 MG tablet Commonly known as: DESYREL  Take 1 tablet (50 mg total) by mouth at bedtime as needed for sleep.   Tresiba  FlexTouch 100 UNIT/ML FlexTouch Pen Generic drug: insulin  degludec Inject 20 Units into the skin daily.   Vitamin D3 1.25 MG (50000 UT) Tabs Take 1 tablet by  mouth once a week.         ALLERGIES: Allergies  Allergen Reactions   Penicillins Swelling and Other (See Comments)    SWELLING REACTION UNSPECIFIED  Has patient had a PCN reaction causing immediate rash, facial/tongue/throat swelling, SOB or lightheadedness with hypotension: No Has patient had a PCN reaction causing severe rash involving mucus membranes or skin necrosis: No Has patient had a PCN reaction that required hospitalization: No Has patient had a PCN reaction occurring within the last 10 years: No If all of the above answers are NO, then may proceed with Cephalosporin use.         OBJECTIVE:   VITAL SIGNS: BP 130/80 (BP Location: Left Arm, Patient Position: Sitting, Cuff Size: Normal)   Pulse 95   Ht 6' 1.5 (1.867 m)   Wt 250 lb (113.4 kg)   SpO2 96%   BMI 32.54 kg/m    PHYSICAL EXAM:  General: Pt appears well and is in NAD  Lungs: Clear with good BS bilat with no rales, rhonchi, or wheezes  Heart: RRR   Extremities:  Lower extremities - No pretibial edema. No lesions.  Neuro: MS is good with appropriate affect, pt is alert and Ox3    DM foot exam: 08/20/2023  The skin of the feet is intact without sores or ulcerations. The pedal pulses are 2+ on right and 2+ on left. The sensation is intact to a screening 5.07, 10 gram monofilament bilaterally   DATA REVIEWED:  Lab Results  Component Value Date   HGBA1C 8.6 (A) 05/05/2024   HGBA1C 8.9 (A) 01/20/2024   HGBA1C 8.7 (H) 09/01/2023    Latest Reference Range & Units 08/20/23 08:23  Sodium 135 - 145 mEq/L 138   Potassium 3.5 - 5.1 mEq/L 3.8  Chloride 96 - 112 mEq/L 102  CO2 19 - 32 mEq/L 27  Glucose 70 - 99 mg/dL 857 (H)  BUN 6 - 23 mg/dL 16  Creatinine 9.59 - 8.49 mg/dL 9.29  Calcium  8.4 - 10.5 mg/dL 9.8  Alkaline Phosphatase 39 - 117 U/L 45  Albumin 3.5 - 5.2 g/dL 4.7  AST 0 - 37 U/L 24  ALT 0 - 53 U/L 41  Total Protein 6.0 - 8.3 g/dL 7.4  Total Bilirubin 0.2 - 1.2 mg/dL 0.6  GFR >39.99 mL/min 109.71    Latest Reference Range & Units 08/20/23 08:23  Total CHOL/HDL Ratio  5  Cholesterol 0 - 200 mg/dL 856  HDL Cholesterol >60.99 mg/dL 68.59 (L)  LDL (calc) 0 - 99 mg/dL 63  MICROALB/CREAT RATIO 0.0 - 30.0 mg/g 12.3  NonHDL  111.21  Triglycerides 0.0 - 149.0 mg/dL 758.9 (H)  VLDL 0.0 - 59.9 mg/dL 51.7 (H)    Latest Reference Range & Units 08/20/23 08:23  Creatinine,U mg/dL 42.1  Microalb, Ur 0.0 - 1.9 mg/dL 7.1 (H)  MICROALB/CREAT RATIO 0.0 - 30.0 mg/g 12.3    In office BG 167 mg/dL   ASSESSMENT / PLAN / RECOMMENDATIONS:   1) Type 2 Diabetes Mellitus, Poorly controlled, With Macrovascular  complications - Most recent A1c of 8.6 %. Goal A1c < 7.0 %.    -A1c continues to be above goal -Patient declined GLP-1 agonist in the past, as his fiance had a bowel obstruction while on Ozempic, today we entertain the idea of Mounjaro - I also discussed insulin  pump technology - Patient opted to increase his insulin  as well as pioglitazone  at this time, I did caution him of weight gain with these changes  would like to stay from this class as his girlfriend end up with bowel obstruction while on Ozempic -Encouraged low-carb diet and lifestyle changes with exercise    MEDICATIONS: Increase pioglitazone  30 mg daily Continue glipizide  10 mg , 2 tablet twice daily Continue metformin  500 mg, 2 tabs daily Continue  Jardiance  25 mg daily Increase Tresiba  24 units daliy   EDUCATION / INSTRUCTIONS: BG monitoring instructions: Patient is instructed to check his blood sugars 1 times a day,  fasting . Call [place] [facility] if: BG persistently < 70  I reviewed the Rule of 15 for the treatment of hypoglycemia in detail with the patient. Literature supplied.   2) Diabetic complications:  Eye: Does not have known diabetic retinopathy.  Neuro/ Feet: Does not have known diabetic peripheral neuropathy. Renal: Patient does not have known baseline CKD. He is  on an ACEI/ARB at present.  3) Hypertriglyceridemia :   -TG continues to trend down - LDL at goal  -I had switched atorvastatin  to rosuvastatin  in March, 2025 due to multiple arthralgias that was attributed to atorvastatin . -Patient has noted improvement in arthralgia on rosuvastatin   Medication rosuvastatin  40 mg daily  fenofibrate  145 mg daily   I spent 25 minutes preparing to see the patient by review of recent labs, imaging and procedures, obtaining and reviewing separately obtained history, communicating with the patient, ordering medications, tests or procedures, and documenting clinical information in the EHR including the differential Dx, treatment, and any further evaluation and other management     Follow-up in 3 months  Signed electronically by: Stefano Redgie Butts, MD  [facility] Endocrinology  [facility] [address] [place] [place], [place] [address] Phone: [phone] FAX: [phone]   CC: Rilla Baller, MD [address] [place] [address] Phone: [phone]  Fax: [phone]    Return to Endocrinology clinic as below: Future Appointments  Date Time Provider Department Center  05/11/2024  4:15 PM Zenaida Reyes CHRISTELLA ALMETA [facility]  09/27/2024  8:15 AM [facility]  10/01/2024  8:00 AM Rilla Baller, MD [facility]

## 2024-05-10 NOTE — Therapy (Deleted)
 OUTPATIENT PHYSICAL THERAPY TREATMENT NOTE   Patient Name: Kyle [name] MRN: [id number] DOB:1975-12-01, 48 y.o., male Today's Date: 05/10/2024  END OF SESSION:      Past Medical History:  Diagnosis Date   COVID-19 11/09/2019   Diabetes type 2, controlled (HCC)    Dr Damian   Dyslipidemia 05/25/2014   Family history of adverse reaction to anesthesia    Mother has postoperatve N&V   GERD 05/11/2007   Gout, unspecified 09/05/2009   History of kidney stones    h/o kidney stones regularly over the past 5 years   HTN (hypertension) 09/08/2008   IBS 01/08/2008   SLEEP APNEA 01/08/2008   cannot tolerate CPAP   Sleep apnea    Past Surgical History:  Procedure Laterality Date   COLONOSCOPY     HERNIA REPAIR     NASAL SEPTOPLASTY W/ TURBINOPLASTY Bilateral 08/28/2018   Procedure: NASAL SEPTOPLASTY WITH TURBINATE REDUCTION;  Surgeon: Roark Rush, MD;  Location: [facility];  Service: ENT;  Laterality: Bilateral;   UPPER GASTROINTESTINAL ENDOSCOPY     Patient Active Problem List   Diagnosis Date Noted   Right elbow pain 03/17/2024   Microalbuminuria due to type 2 diabetes mellitus (HCC) 03/17/2024   Hypertriglyceridemia 04/02/2022   Type 2 diabetes mellitus with hyperglycemia, with long-term current use of insulin  (HCC) 04/02/2022   CAD (coronary artery disease) 10/22/2021   Adjustment disorder with mixed anxiety and depressed mood 07/17/2020   Left flank pain 01/01/2019   Haglund's deformity of both heels 01/01/2019   Vitamin D  deficiency 06/30/2018   Fatigue 04/07/2018   History of kidney stones 07/01/2017   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  Rationale for Evaluation and Treatment: Rehabilitation  ONSET DATE: 01/2023  SUBJECTIVE:                                                                                                                                                                                      SUBJECTIVE STATEMENT:  Did well following last session but tweaked elbow 4 days ago.  Symptoms have subsided but not to previous level. Hand dominance: Right  PERTINENT HISTORY:  Kyle [name] is a 48 y.o. very pleasant male patient with Body mass index is 33.07 kg/m. who presents with the following:   Kyle [name] is a pleasant gentleman who presents with some ongoing elbow pain.  I actually saw him with some lateral epicondylitis of the right elbow in April 2024.  At that time, did do a lateral epicondylitis injection.   He has had a recurrence of right-sided elbow pain.   After injection, felt better quickly. Three months ago started to hurt really bad Med changes and CoQ 10 has not helped   He has started get some pain with terminal medicine and flexion and extension at the elbow.  He also has pain with flexion extension at the wrist. The topical pain with palpation is variable, but does have pain with active movement such as lifting anything even things that are such as light as a coffee cup.  PAIN:  Are you having pain? Yes: NPRS scale: 2/10 Pain location: R elbow Pain description: ache Aggravating factors: grasping Relieving factors: rest/meds/stretching   PRECAUTIONS: None  RED FLAGS: None   WEIGHT BEARING RESTRICTIONS: No  FALLS:  Has patient fallen in last 6 months? No  OCCUPATION: Office work  PLOF: Independent  PATIENT GOALS:To manage my elbow pain  NEXT MD VISIT:    OBJECTIVE:  Note: Objective measures were completed at Evaluation unless otherwise noted.  DIAGNOSTIC FINDINGS:  none  PATIENT SURVEYS:  Quick Dash 75%   UPPER EXTREMITY ROM: WNL  Active ROM Right eval Left eval  Shoulder flexion    Shoulder extension    Shoulder abduction    Shoulder adduction    Shoulder internal rotation    Shoulder external rotation    Elbow flexion    Elbow extension    Wrist flexion    Wrist extension    Wrist ulnar deviation    Wrist radial deviation    Wrist pronation    Wrist supination    (Blank rows = not tested)  UPPER EXTREMITY MMT:  MMT Right eval Left eval  Shoulder flexion    Shoulder extension    Shoulder abduction    Shoulder adduction    Shoulder internal rotation    Shoulder external rotation    Middle trapezius    Lower trapezius    Elbow flexion    Elbow extension    Wrist flexion    Wrist extension    Wrist ulnar deviation    Wrist radial deviation    Wrist pronation    Wrist supination    Grip strength (lbs) 85 80  Key pinch 24 24  (Blank rows = not tested)   JOINT MOBILITY TESTING:  Unremarkable   PALPATION:  TTP Common wrist extensor group                                                                                                                             TREATMENT DATE:  [facility] Adult PT Treatment:  DATE: 04/28/24 Therapeutic Exercise: UBE L2 3/3 min Manual Therapy: STM R wrist extensors and radial head Skilled palpation to identify taught and irritable bands in R wrist extensor group Trigger Point Dry Needling  Initial Treatment: Pt instructed on Dry Needling rational, procedures, and possible side effects. Pt instructed to expect mild to moderate muscle soreness later in the day and/or into the next day.  Pt instructed in methods to reduce muscle soreness. Pt instructed to continue prescribed HEP. Patient was educated on signs and symptoms of  infection and other risk factors and advised to seek medical attention should they occur.  Patient verbalized understanding of these instructions and education.   Patient Verbal Consent Given: Yes Education Handout Provided: Yes Muscles Treated: R wrist extensors Electrical Stimulation Performed: No Treatment Response/Outcome: twitch response elicited.     Therapeutic Activity: R wrist flex/ext with yellow wrist roller 15x2 ea. R wrist pro/sup with yellow wrist roller 15x2 ea. R wrist rad/uln deviation with yellow wrist roller 15x2 ea.  [facility] Adult PT Treatment:                                                DATE: 04/15/24 Therapeutic Exercise: UBE L1 3/3 min Manual Therapy: STM to common extensor group R radial head mobs Therapeutic Activity: R wrist flex/ext with yellow wrist roller 15x2 ea. R wrist pro/sup with yellow wrist roller 15x2 ea. R wrist rad/uln deviation with yellow wrist roller 15x2 ea.  [facility] Adult PT Treatment:                                                DATE: 04/05/24 Eval and HEP Self Care: Additional minutes spent for educating on updated Therapeutic Home Exercise Program as well as comparing current status to condition at start of symptoms. This included exercises focusing on stretching, strengthening, with focus on eccentric aspects. Long term goals include an improvement in range of motion, strength, endurance as well as avoiding reinjury. Patient's frequency would include in 1-2 times a day, 3-5 times a week for a duration of 6-12 weeks. Proper technique shown and discussed handout in great detail. All questions were discussed and addressed.      PATIENT EDUCATION: Education details: Discussed eval findings, rehab rationale and POC and patient is in agreement  Person educated: Patient Education method: Explanation and Handouts Education comprehension: verbalized understanding and needs further education  HOME EXERCISE PROGRAM: Access Code: [email or web address] URL:  [email or web address] Date: 04/05/2024 Prepared by: Reyes Kohut  Exercises - Standing Wrist Extension Stretch  - 2 x daily - 5 x weekly - 1 sets - 2 reps - 30s hold - Seated Wrist Flexion with Dumbbell  - 2 x daily - 5 x weekly - 1 sets - 30 reps - 30s hold - Seated Wrist Extension with Dumbbell  - 2 x daily - 5 x weekly - 1 sets - 10 reps - 30s hold  ASSESSMENT:  CLINICAL IMPRESSION: Good response to previous treatment but symptoms returned following an awkward movement.  Patient agreeable to trial of TPDN to R forearm extensors.  Good response elicited following technique.  Patient to follow up 1w2 to assess benefit of DN    Patient is a 48 y.o. male who  was seen today for physical therapy evaluation and treatment for Chronic R elbow pain.  Patient presents with full ROM as well as grip/pinch strength.  Palpation finds point tenderness to common extensor group muscle belly.  Patient presents with S&S of trigger point pain in R wrist extensors.   OBJECTIVE IMPAIRMENTS: decreased activity tolerance, decreased knowledge of condition, impaired perceived functional ability, increased muscle spasms, and pain.   ACTIVITY LIMITATIONS: carrying, lifting, and bed mobility  PERSONAL FACTORS: Age, Past/current experiences, and Time since onset of injury/illness/exacerbation are also affecting patient's functional outcome.   REHAB POTENTIAL: Good  CLINICAL DECISION MAKING: Stable/uncomplicated  EVALUATION COMPLEXITY: Low   GOALS: Goals reviewed with patient? No  SHORT TERM GOALS=LONG TERM GOALS: Target date: 05/31/2024    Patient will acknowledge 1/10 pain at least once during episode of care   Baseline: 2/10 Goal status: INITIAL  2.  Patient will score at least 85% on QDASH to signify clinically meaningful improvement in functional abilities.   Baseline: 75% Goal status: INITIAL  3.  Patient to demonstrate independence in HEP  Baseline: [email or web address] Goal status:  INITIAL  4.  Minimal TTP R wrist extensor group Baseline: moderate TTP Goal status: INITIAL    PLAN:  PT FREQUENCY: 1-2x/week  PT DURATION: 6 weeks  PLANNED INTERVENTIONS: 97110-Therapeutic exercises, 97530- Therapeutic activity, [id number]- Neuromuscular re-education, 97535- Self Care, 02859- Manual therapy, 97760- Splinting, 97035- Ultrasound, 02966- Ionotophoresis 4mg /ml Dexamethasone , Patient/Family education, and Joint mobilization  PLAN FOR NEXT SESSION: HEP review and update, manual techniques as appropriate, aerobic tasks, ROM and flexibility activities, strengthening and PREs, TPDN, gait and balance training as needed     Reyes CHRISTELLA Kohut, PT 05/10/2024, 9:35 AM

## 2024-05-11 ENCOUNTER — Telehealth: Payer: Self-pay

## 2024-05-11 ENCOUNTER — Ambulatory Visit

## 2024-05-11 NOTE — Telephone Encounter (Signed)
 TC due to missed visit.  Spoke directly to patient who was tied up at work attending to a serious employee injury.  Doing bettre but unable to reliably reschedule to work/life obligations but will call to reschedule as needed.

## 2024-05-17 ENCOUNTER — Other Ambulatory Visit: Payer: Self-pay | Admitting: Internal Medicine

## 2024-05-17 DIAGNOSIS — E1165 Type 2 diabetes mellitus with hyperglycemia: Secondary | ICD-10-CM

## 2024-05-24 ENCOUNTER — Other Ambulatory Visit: Payer: Self-pay

## 2024-05-24 DIAGNOSIS — E1165 Type 2 diabetes mellitus with hyperglycemia: Secondary | ICD-10-CM

## 2024-05-24 MED ORDER — METFORMIN HCL ER 500 MG PO TB24: ORAL_TABLET | ORAL | 1 refills | Status: DC

## 2024-08-14 ENCOUNTER — Other Ambulatory Visit: Payer: Self-pay | Admitting: Family Medicine

## 2024-08-16 NOTE — Telephone Encounter (Signed)
 Name of Medication: Trazodone  50 mg   Name of Pharmacy: [facility] Last [place] or Written Date and Quantity: 09/08/2023 Last Office Visit and Type: 03/24/24 epicondylitis of R Elbow Next Office Visit and Type: 09/27/24

## 2024-08-18 ENCOUNTER — Ambulatory Visit: Admitting: Internal Medicine

## 2024-08-18 ENCOUNTER — Encounter: Payer: Self-pay | Admitting: Internal Medicine

## 2024-08-18 ENCOUNTER — Other Ambulatory Visit

## 2024-08-18 VITALS — BP 132/88 | HR 84 | Ht 73.5 in | Wt 251.8 lb

## 2024-08-18 DIAGNOSIS — R809 Proteinuria, unspecified: Secondary | ICD-10-CM

## 2024-08-18 DIAGNOSIS — Z794 Long term (current) use of insulin: Secondary | ICD-10-CM

## 2024-08-18 DIAGNOSIS — E1165 Type 2 diabetes mellitus with hyperglycemia: Secondary | ICD-10-CM

## 2024-08-18 DIAGNOSIS — E781 Pure hyperglyceridemia: Secondary | ICD-10-CM

## 2024-08-18 DIAGNOSIS — E1129 Type 2 diabetes mellitus with other diabetic kidney complication: Secondary | ICD-10-CM

## 2024-08-18 LAB — POCT GLYCOSYLATED HEMOGLOBIN (HGB A1C): Hemoglobin A1C: 8.6 % — AB (ref 4.0–5.6)

## 2024-08-18 LAB — POCT GLUCOSE (DEVICE FOR HOME USE): Glucose Fasting, POC: 191 mg/dL — AB (ref 70–99)

## 2024-08-18 MED ORDER — ONDANSETRON HCL 4 MG PO TABS: 4.0000 mg | ORAL_TABLET | Freq: Three times a day (TID) | ORAL | 0 refills | Status: DC | PRN

## 2024-08-18 MED ORDER — TIRZEPATIDE 2.5 MG/0.5ML ~~LOC~~ SOAJ: 2.5000 mg | SUBCUTANEOUS | Status: DC

## 2024-08-18 NOTE — Progress Notes (Unsigned)
 Name: Kyle [name]  MRN/ DOB: [id number], 09/14/1976   Age/ Sex: 48 y.o., male    PCP: Rilla Baller, MD   Reason for Endocrinology Evaluation: Type 2 Diabetes Mellitus     Date of Initial Endocrinology Visit: 04/02/2022    PATIENT IDENTIFIER: [name] is a 48 y.o. male with a past medical history of T2DM, Dyslipidemia, CAD and OSA. The patient presented for initial endocrinology clinic visit on 04/02/2022 for consultative assistance with his diabetes management.    HPI: Kyle [name] was    Diagnosed with DM in 2018 Prior Medications tried/Intolerance: Intolerant to higher doses of metformin  . Was on insulin  at some point.            Hemoglobin A1c has ranged from 6.8% in 2018, peaking at 11.5% in 2022.  No prior dx of pancreatitis  Has chronic GI issues with alternating bowel movement    On his initial visit to our clinic his A1c was 9.1%, he was on Jardiance  and metformin  which we continued and added glipizide   He was started on insulin  by December 2023 due to persistent hyperglycemia   Started pioglitazone  12/2023 with an A1c of 8.9% SUBJECTIVE:   During the last visit (05/05/2024): A1C 8.6%    Today (08/18/24): Kyle [name] is here for follow-up on diabetes management.  He checks his blood sugars 2x weekly .  The patient has not had hypoglycemic episodes.   He has been traveling a lot for work and has been which has resluted in increase CHO and ETOH intake  He continues to forget evening dose of glipizide    Continues with chronic diarrhea since childhood alternating with constipation  No nausea or vomiting    HOME DIABETES REGIMEN: Pioglitazone  30 mg daily Jardiance  25 mg daily  Metformin  500 mg 2 tabs daily  Glipizide  10 mg , 2 tabs BID-takes 2 tabs in the morning Tresiba   24 units daily Rosuvastatin  40 mg daily Fenofibrate  145 mg daily   Statin: yes ACE-I/ARB: yes    METER DOWNLOAD SUMMARY:  n/a    DIABETIC COMPLICATIONS: Microvascular  complications:   Denies: CKD, neuropathy, retinopathy  Last eye exam: Completed 2024  Macrovascular complications:  CAD Denies:  PVD, CVA   PAST HISTORY: Past Medical History:  Past Medical History:  Diagnosis Date   COVID-19 11/09/2019   Diabetes type 2, controlled (HCC)    Dr Damian   Dyslipidemia 05/25/2014   Family history of adverse reaction to anesthesia    Mother has postoperatve N&V   GERD 05/11/2007   Gout, unspecified 09/05/2009   History of kidney stones    h/o kidney stones regularly over the past 5 years   HTN (hypertension) 09/08/2008   IBS 01/08/2008   SLEEP APNEA 01/08/2008   cannot tolerate CPAP   Sleep apnea    Past Surgical History:  Past Surgical History:  Procedure Laterality Date   COLONOSCOPY     HERNIA REPAIR     NASAL SEPTOPLASTY W/ TURBINOPLASTY Bilateral 08/28/2018   Procedure: NASAL SEPTOPLASTY WITH TURBINATE REDUCTION;  Surgeon: Roark Rush, MD;  Location: [facility] OR;  Service: ENT;  Laterality: Bilateral;   UPPER GASTROINTESTINAL ENDOSCOPY      Social History:  reports that he quit smoking about 20 years ago. His smoking use included cigarettes. He quit smokeless tobacco use about 20 years ago. He reports current alcohol use. He reports that he does not use drugs. Family History:  Family History  Problem Relation Age of Onset  CAD Mother 71   Cancer Father 33       prostate s/p radiation seed implant   CAD Father 68   Hypothyroidism Father 69   Cancer Sister 14       cervical   Colon cancer Maternal Uncle    Cancer Maternal Uncle        colon   Stroke Other        bilateral grandfathers   Hypertension Other        strong on maternal side   CAD Other        bilateral grandfathers   Diabetes Neg Hx    Colon polyps Neg Hx    Crohn's disease Neg Hx    Esophageal cancer Neg Hx    Rectal cancer Neg Hx    Stomach cancer Neg Hx    Ulcerative colitis Neg Hx      HOME MEDICATIONS: Allergies as of 08/18/2024       Reactions    Penicillins Swelling, Other (See Comments)   SWELLING REACTION UNSPECIFIED  Has patient had a PCN reaction causing immediate rash, facial/tongue/throat swelling, SOB or lightheadedness with hypotension: No Has patient had a PCN reaction causing severe rash involving mucus membranes or skin necrosis: No Has patient had a PCN reaction that required hospitalization: No Has patient had a PCN reaction occurring within the last 10 years: No If all of the above answers are NO, then may proceed with Cephalosporin use.        Medication List        Accurate as of August 18, 2024  8:25 AM. If you have any questions, ask your nurse or doctor.          Accu-Chek Guide test strip Generic drug: glucose blood 1 each by Other route in the morning and at bedtime. Use as instructed   allopurinol  300 MG tablet Commonly known as: ZYLOPRIM  Take 1 tablet (300 mg total) by mouth daily.   amLODipine  10 MG tablet Commonly known as: NORVASC  TAKE 1 TABLET BY MOUTH DAILY   Co-Enzyme Q10 100 MG Caps Take 1 capsule by mouth daily.   empagliflozin  25 MG Tabs tablet Commonly known as: JARDIANCE  Take 1 tablet (25 mg total) by mouth daily.   fenofibrate  145 MG tablet Commonly known as: Tricor  Take 1 tablet (145 mg total) by mouth daily.   glipiZIDE  10 MG tablet Commonly known as: GLUCOTROL  Take 2 tablets (20 mg total) by mouth 2 (two) times daily before a meal. What changed: when to take this   Insulin  Pen Needle 32G X 4 MM Misc 1 Device by Does not apply route daily in the afternoon.   losartan  100 MG tablet Commonly known as: COZAAR  TAKE 1 TABLET BY MOUTH DAILY   metFORMIN  500 MG 24 hr tablet Commonly known as: GLUCOPHAGE -XR TAKE 2 TABLETS(1000 MG) BY MOUTH DAILY WITH BREAKFAST   Multivitamin Gummies Adult Chew Chew 1 tablet by mouth daily.   pioglitazone  30 MG tablet Commonly known as: ACTOS  Take 1 tablet (30 mg total) by mouth daily.   rosuvastatin  40 MG tablet Commonly known  as: CRESTOR  Take 1 tablet (40 mg total) by mouth daily.   traZODone  50 MG tablet Commonly known as: DESYREL  Take 1 tablet (50 mg total) by mouth at bedtime as needed for sleep.   Tresiba  FlexTouch 100 UNIT/ML FlexTouch Pen Generic drug: insulin  degludec Inject 24 Units into the skin daily.   Vitamin D3 1.25 MG (50000 UT) Tabs Take 1 tablet by  mouth once a week.         ALLERGIES: Allergies  Allergen Reactions   Penicillins Swelling and Other (See Comments)    SWELLING REACTION UNSPECIFIED  Has patient had a PCN reaction causing immediate rash, facial/tongue/throat swelling, SOB or lightheadedness with hypotension: No Has patient had a PCN reaction causing severe rash involving mucus membranes or skin necrosis: No Has patient had a PCN reaction that required hospitalization: No Has patient had a PCN reaction occurring within the last 10 years: No If all of the above answers are NO, then may proceed with Cephalosporin use.         OBJECTIVE:   VITAL SIGNS: BP 132/88 (BP Location: Left Arm, Patient Position: Sitting, Cuff Size: Normal)   Pulse 84   Ht 6' 1.5 (1.867 m)   Wt 251 lb 12.8 oz (114.2 kg)   SpO2 95%   BMI 32.77 kg/m    PHYSICAL EXAM:  General: Pt appears well and is in NAD  Lungs: Clear with good BS bilat with no rales, rhonchi, or wheezes  Heart: RRR   Extremities:  Lower extremities - No pretibial edema. No lesions.  Neuro: MS is good with appropriate affect, pt is alert and Ox3    DM foot exam: 08/18/2024  The skin of the feet is intact without sores or ulcerations. The pedal pulses are 2+ on right and 2+ on left. The sensation is intact to a screening 5.07, 10 gram monofilament bilaterally   DATA REVIEWED:  Lab Results  Component Value Date   HGBA1C 8.6 (A) 08/18/2024   HGBA1C 8.6 (A) 05/05/2024   HGBA1C 8.9 (A) 01/20/2024     Latest Reference Range & Units 08/18/24 08:40  Sodium 135 - 146 mmol/L 138  Potassium 3.5 - 5.3 mmol/L 4.3   Chloride 98 - 110 mmol/L 102  CO2 20 - 32 mmol/L 27  Glucose 65 - 99 mg/dL 841 (H)  BUN 7 - 25 mg/dL 19  Creatinine 9.39 - 8.70 mg/dL 9.20  Calcium  8.6 - 10.3 mg/dL 9.9  BUN/Creatinine Ratio 6 - 22 (calc) SEE NOTE:  eGFR > OR = 60 mL/min/1.42m2 110    Latest Reference Range & Units 08/18/24 08:40  Total CHOL/HDL Ratio <5.0 (calc) 5.7 (H)  Cholesterol <200 mg/dL 835  HDL Cholesterol > OR = 40 mg/dL 29 (L)  LDL Cholesterol (Calc) mg/dL (calc) Pend  MICROALB/CREAT RATIO <30 mg/g creat 207 (H)  Non-HDL Cholesterol (Calc) <130 mg/dL (calc) 864 (H)  Triglycerides <150 mg/dL 307 (H)     Latest Reference Range & Units 08/18/24 08:40  Microalb, Ur mg/dL 87.3  MICROALB/CREAT RATIO <30 mg/g creat 207 (H)  Creatinine, Urine 20 - 320 mg/dL 61  (H): Data is abnormally high     In office BG 191 mg/dL   ASSESSMENT / PLAN / RECOMMENDATIONS:   1) Type 2 Diabetes Mellitus, Poorly controlled, With Macrovascular complications and microalbuminuria- Most recent A1c of 8.6 %. Goal A1c < 7.0 %.    -A1c continues to be above goal -Patient unable to change lifestyle due to multiple business trips -Patient declined GLP-1 agonist in the past, as his wife had a bowel obstruction while on Ozempic -I did strongly encourage him to try Mounjaro, caution against GI side effects, he was provided with #4 sample pens of Mounjaro 2.5 mg.  He was also cautioned against hypoglycemia, and to notify the office should hypoglycemia ensue  so I can adjust his glipizide /insulin . -We have discussed insulin  pump technology in the  past - A prescription for Zofran  was sent to the pharmacy in case needed for nausea - We have opted not to make any changes to his current glycemic regimen, but if he is not going to take Mounjaro then the patient will need to increase Tresiba  to 28 units - He is not able to take glipizide  in the evening, has only been able to take it in the morning, will not change - Patient to let me know  within 3 weeks if he is interested in continuing Mounjaro so I can send a prescription  MEDICATIONS: Continue pioglitazone  30 mg daily Continue glipizide  10 mg , 2 tablet daily Continue metformin  500 mg, 2 tabs daily Continue  Jardiance  25 mg daily Continue Tresiba  24 units daliy  Sample Mounjaro 2.5 mg weekly  EDUCATION / INSTRUCTIONS: BG monitoring instructions: Patient is instructed to check his blood sugars 1 times a day, fasting . Call [facility] if: BG persistently < 70  I reviewed the Rule of 15 for the treatment of hypoglycemia in detail with the patient. Literature supplied.   2) Diabetic complications:  Eye: Does not have known diabetic retinopathy.  Neuro/ Feet: Does not have known diabetic peripheral neuropathy. Renal: Patient does not have known baseline CKD. He is  on an ACEI/ARB at present.  3) Hypertriglyceridemia :   -TGs are trending up, patient admits to dietary indiscretions  -I had switched atorvastatin  to rosuvastatin  in March, 2025 due to multiple arthralgias that was attributed to atorvastatin . -Patient has noted improvement in arthralgia on rosuvastatin   Medication Continue rosuvastatin  40 mg daily Continue fenofibrate  145 mg daily   3.  Microalbuminuria:  - MA/CR ratio elevated - Patient on maximum dose losartan , as well as Jardiance  - Will continue to monitor and emphasized the importance of optimizing glucose control   Follow-up in 3 months  Signed electronically by: Stefano Redgie Butts, MD  [facility]  [facility] [address] [place] [place], [place] [address] Phone: [phone] FAX: [phone]   CC: Rilla Baller, MD [address] [place] [address] Phone: [phone]  Fax: [phone]    Return to Endocrinology clinic as below: Future Appointments  Date Time Provider Department Center  09/27/2024  8:15 AM [facility] LAB [facility] [address]  10/01/2024  8:00 AM  Rilla Baller, MD [facility] [address]

## 2024-08-18 NOTE — Patient Instructions (Addendum)
-   Take Mounjaro 2.5 mg weekly for 4 weeks, if no side effects, please send me a portal message so I can send a prescription of Mounjaro 5 mg weekly - Continue  Glipizide  10 mg, 2 tablet before first meal  - Continue Pioglitazone  ( Actos ) 30 mg, 1 tablet daily  - Continue Jardiance  25 mg daily - Continue Metformin  500 mg, two tablets daily  - Continue Tresiba  24 units daily ( if you are not going to take Mounjaro , increase insulin  to 28 units )     HOW TO TREAT LOW BLOOD SUGARS (Blood sugar LESS THAN 70 MG/DL) Please follow the RULE OF 15 for the treatment of hypoglycemia treatment (when your (blood sugars are less than 70 mg/dL)   STEP 1: Take 15 grams of carbohydrates when your blood sugar is low, which includes:  3-4 GLUCOSE TABS  OR 3-4 OZ OF JUICE OR REGULAR SODA OR ONE TUBE OF GLUCOSE GEL    STEP 2: RECHECK blood sugar in 15 MINUTES STEP 3: If your blood sugar is still low at the 15 minute recheck --> then, go back to STEP 1 and treat AGAIN with another 15 grams of carbohydrates.

## 2024-08-19 ENCOUNTER — Ambulatory Visit: Payer: Self-pay | Admitting: Internal Medicine

## 2024-08-19 DIAGNOSIS — E1129 Type 2 diabetes mellitus with other diabetic kidney complication: Secondary | ICD-10-CM | POA: Insufficient documentation

## 2024-08-19 LAB — MICROALBUMIN / CREATININE URINE RATIO
Creatinine, Urine: 61 mg/dL (ref 20–320)
Microalb Creat Ratio: 207 mg/g{creat} — ABNORMAL HIGH (ref <30)
Microalb, Ur: 12.6 mg/dL

## 2024-08-19 LAB — LIPID PANEL
Cholesterol: 164 mg/dL (ref <200)
HDL: 29 mg/dL — ABNORMAL LOW (ref >=40)
Non-HDL Cholesterol (Calc): 135 mg/dL — ABNORMAL HIGH (ref <130)
Total CHOL/HDL Ratio: 5.7 (calc) — ABNORMAL HIGH (ref <5.0)
Triglycerides: 692 mg/dL — ABNORMAL HIGH (ref <150)

## 2024-08-19 LAB — BASIC METABOLIC PANEL WITH GFR
BUN: 19 mg/dL (ref 7–25)
CO2: 27 mmol/L (ref 20–32)
Calcium: 9.9 mg/dL (ref 8.6–10.3)
Chloride: 102 mmol/L (ref 98–110)
Creat: 0.79 mg/dL (ref 0.60–1.29)
Glucose, Bld: 158 mg/dL — ABNORMAL HIGH (ref 65–99)
Potassium: 4.3 mmol/L (ref 3.5–5.3)
Sodium: 138 mmol/L (ref 135–146)
eGFR: 110 mL/min/1.73m2 (ref >=60)

## 2024-09-25 ENCOUNTER — Other Ambulatory Visit: Payer: Self-pay | Admitting: Family Medicine

## 2024-09-25 DIAGNOSIS — E781 Pure hyperglyceridemia: Secondary | ICD-10-CM

## 2024-09-25 DIAGNOSIS — E1169 Type 2 diabetes mellitus with other specified complication: Secondary | ICD-10-CM

## 2024-09-25 DIAGNOSIS — M1A9XX0 Chronic gout, unspecified, without tophus (tophi): Secondary | ICD-10-CM

## 2024-09-25 DIAGNOSIS — Z794 Long term (current) use of insulin: Secondary | ICD-10-CM

## 2024-09-25 DIAGNOSIS — E559 Vitamin D deficiency, unspecified: Secondary | ICD-10-CM

## 2024-09-27 ENCOUNTER — Other Ambulatory Visit

## 2024-09-29 ENCOUNTER — Other Ambulatory Visit

## 2024-09-29 DIAGNOSIS — E781 Pure hyperglyceridemia: Secondary | ICD-10-CM

## 2024-09-29 DIAGNOSIS — E1169 Type 2 diabetes mellitus with other specified complication: Secondary | ICD-10-CM

## 2024-09-29 DIAGNOSIS — Z794 Long term (current) use of insulin: Secondary | ICD-10-CM

## 2024-09-29 DIAGNOSIS — E1165 Type 2 diabetes mellitus with hyperglycemia: Secondary | ICD-10-CM | POA: Diagnosis not present

## 2024-09-29 DIAGNOSIS — E785 Hyperlipidemia, unspecified: Secondary | ICD-10-CM

## 2024-09-29 DIAGNOSIS — M1A9XX0 Chronic gout, unspecified, without tophus (tophi): Secondary | ICD-10-CM

## 2024-09-29 DIAGNOSIS — E559 Vitamin D deficiency, unspecified: Secondary | ICD-10-CM

## 2024-09-29 LAB — CBC WITH DIFFERENTIAL/PLATELET
Basophils Absolute: 0.1 K/uL (ref 0.0–0.1)
Basophils Relative: 1 % (ref 0.0–3.0)
Eosinophils Absolute: 0.2 K/uL (ref 0.0–0.7)
Eosinophils Relative: 3.2 % (ref 0.0–5.0)
HCT: 43.4 % (ref 39.0–52.0)
Hemoglobin: 15.3 g/dL (ref 13.0–17.0)
Lymphocytes Relative: 31.8 % (ref 12.0–46.0)
Lymphs Abs: 2.1 K/uL (ref 0.7–4.0)
MCHC: 35.2 g/dL (ref 30.0–36.0)
MCV: 83.4 fl (ref 78.0–100.0)
Monocytes Absolute: 0.5 K/uL (ref 0.1–1.0)
Monocytes Relative: 7.7 % (ref 3.0–12.0)
Neutro Abs: 3.7 K/uL (ref 1.4–7.7)
Neutrophils Relative %: 56.3 % (ref 43.0–77.0)
Platelets: 210 K/uL (ref 150.0–400.0)
RBC: 5.21 Mil/uL (ref 4.22–5.81)
RDW: 13.4 % (ref 11.5–15.5)
WBC: 6.5 K/uL (ref 4.0–10.5)

## 2024-09-29 LAB — COMPREHENSIVE METABOLIC PANEL WITH GFR
ALT: 28 U/L (ref 0–53)
AST: 17 U/L (ref 0–37)
Albumin: 4.7 g/dL (ref 3.5–5.2)
Alkaline Phosphatase: 48 U/L (ref 39–117)
BUN: 15 mg/dL (ref 6–23)
CO2: 27 meq/L (ref 19–32)
Calcium: 9.3 mg/dL (ref 8.4–10.5)
Chloride: 98 meq/L (ref 96–112)
Creatinine, Ser: 0.65 mg/dL (ref 0.40–1.50)
GFR: 111.32 mL/min (ref >60.00)
Glucose, Bld: 321 mg/dL — ABNORMAL HIGH (ref 70–99)
Potassium: 4 meq/L (ref 3.5–5.1)
Sodium: 134 meq/L — ABNORMAL LOW (ref 135–145)
Total Bilirubin: 0.7 mg/dL (ref 0.2–1.2)
Total Protein: 6.9 g/dL (ref 6.0–8.3)

## 2024-09-29 LAB — URIC ACID: Uric Acid, Serum: 6.3 mg/dL (ref 4.0–7.8)

## 2024-09-29 LAB — LIPID PANEL
Cholesterol: 167 mg/dL (ref 0–200)
HDL: 26.9 mg/dL — ABNORMAL LOW (ref >39.00)
NonHDL: 140.05
Total CHOL/HDL Ratio: 6
Triglycerides: 640 mg/dL — ABNORMAL HIGH (ref 0.0–149.0)
VLDL: 128 mg/dL — ABNORMAL HIGH (ref 0.0–40.0)

## 2024-09-29 LAB — VITAMIN B12: Vitamin B-12: 267 pg/mL (ref 211–911)

## 2024-09-29 LAB — VITAMIN D 25 HYDROXY (VIT D DEFICIENCY, FRACTURES): VITD: 20.03 ng/mL — ABNORMAL LOW (ref 30.00–100.00)

## 2024-09-29 LAB — LDL CHOLESTEROL, DIRECT: Direct LDL: 56 mg/dL

## 2024-09-29 LAB — HEMOGLOBIN A1C: Hgb A1c MFr Bld: 9.7 % — ABNORMAL HIGH (ref 4.6–6.5)

## 2024-09-29 NOTE — Addendum Note (Signed)
 Addended by: ISADORA RAISIN on: 09/29/2024 10:12 AM   Modules accepted: Orders

## 2024-09-30 ENCOUNTER — Ambulatory Visit: Payer: Self-pay | Admitting: Family Medicine

## 2024-10-01 ENCOUNTER — Ambulatory Visit

## 2024-10-01 ENCOUNTER — Encounter: Payer: Self-pay | Admitting: Family Medicine

## 2024-10-01 ENCOUNTER — Ambulatory Visit: Admitting: Family Medicine

## 2024-10-01 VITALS — BP 168/106 | HR 85 | Temp 97.8°F | Ht 73.5 in | Wt 254.1 lb

## 2024-10-01 DIAGNOSIS — E1165 Type 2 diabetes mellitus with hyperglycemia: Secondary | ICD-10-CM

## 2024-10-01 DIAGNOSIS — E1129 Type 2 diabetes mellitus with other diabetic kidney complication: Secondary | ICD-10-CM

## 2024-10-01 DIAGNOSIS — E1169 Type 2 diabetes mellitus with other specified complication: Secondary | ICD-10-CM | POA: Diagnosis not present

## 2024-10-01 DIAGNOSIS — E559 Vitamin D deficiency, unspecified: Secondary | ICD-10-CM

## 2024-10-01 DIAGNOSIS — I10 Essential (primary) hypertension: Secondary | ICD-10-CM

## 2024-10-01 DIAGNOSIS — I2510 Atherosclerotic heart disease of native coronary artery without angina pectoris: Secondary | ICD-10-CM | POA: Diagnosis not present

## 2024-10-01 DIAGNOSIS — Z8042 Family history of malignant neoplasm of prostate: Secondary | ICD-10-CM

## 2024-10-01 DIAGNOSIS — Z0001 Encounter for general adult medical examination with abnormal findings: Secondary | ICD-10-CM

## 2024-10-01 DIAGNOSIS — E781 Pure hyperglyceridemia: Secondary | ICD-10-CM

## 2024-10-01 DIAGNOSIS — Z23 Encounter for immunization: Secondary | ICD-10-CM | POA: Diagnosis not present

## 2024-10-01 DIAGNOSIS — E66811 Obesity, class 1: Secondary | ICD-10-CM

## 2024-10-01 DIAGNOSIS — G4733 Obstructive sleep apnea (adult) (pediatric): Secondary | ICD-10-CM

## 2024-10-01 DIAGNOSIS — M1A9XX0 Chronic gout, unspecified, without tophus (tophi): Secondary | ICD-10-CM | POA: Diagnosis not present

## 2024-10-01 DIAGNOSIS — E785 Hyperlipidemia, unspecified: Secondary | ICD-10-CM

## 2024-10-01 DIAGNOSIS — Z1211 Encounter for screening for malignant neoplasm of colon: Secondary | ICD-10-CM

## 2024-10-01 DIAGNOSIS — Z794 Long term (current) use of insulin: Secondary | ICD-10-CM

## 2024-10-01 MED ORDER — VITAMIN D (ERGOCALCIFEROL) 1.25 MG (50000 UNIT) PO CAPS: 50000.0000 [IU] | ORAL_CAPSULE | ORAL | 3 refills | Status: DC

## 2024-10-01 MED ORDER — LOSARTAN POTASSIUM 100 MG PO TABS: ORAL_TABLET | ORAL | 3 refills | Status: DC

## 2024-10-01 MED ORDER — TRAZODONE HCL 50 MG PO TABS: 50.0000 mg | ORAL_TABLET | Freq: Every day | ORAL | 3 refills | Status: AC

## 2024-10-01 MED ORDER — FENOFIBRATE 145 MG PO TABS: 145.0000 mg | ORAL_TABLET | Freq: Every day | ORAL | 3 refills | Status: AC

## 2024-10-01 MED ORDER — AMLODIPINE BESYLATE 10 MG PO TABS: ORAL_TABLET | ORAL | 3 refills | Status: AC

## 2024-10-01 NOTE — Addendum Note (Signed)
 Addended by: Fay Bagg C on: 10/01/2024 10:45 AM   Modules accepted: Orders

## 2024-10-01 NOTE — Assessment & Plan Note (Addendum)
 Chronic, trig markedly elevated in setting of hyperglycemia.  Previous trial Vascepa  - unsure effect. Reviewed ASCVD risk as well as elevated CCS (77%) back in 2022.  Offered PCSK9i - he declines further injectable medication at this time.  The 10-year ASCVD risk score (Arnett DK, et al., 2019) is: 10%   Values used to calculate the score:     Age: 48 years     Clincally relevant sex: Male     Is Non-Hispanic African American: No     Diabetic: Yes     Tobacco smoker: No     Systolic Blood Pressure: 132 mmHg     Is BP treated: Yes     HDL Cholesterol: 26.9 mg/dL     Total Cholesterol: 167 mg/dL

## 2024-10-01 NOTE — Assessment & Plan Note (Signed)
 Continue to encourage healthy diet and lifestyle choices to affect sustainable weight loss.

## 2024-10-01 NOTE — Assessment & Plan Note (Signed)
 Continue statin.  Consider aspirin , consider PCSK9i - see above.

## 2024-10-01 NOTE — Assessment & Plan Note (Addendum)
 Preventative protocols reviewed and updated unless pt declined. Discussed healthy diet and lifestyle.  Declines colonoscopy, agrees to Cologuard - ordered.  PSA added on, result pending

## 2024-10-01 NOTE — Patient Instructions (Addendum)
 Flu shot and prevnar-20 pneumonia shot today.  Consider shingrix vaccines in the future. Medicines refilled.  I will sign you for Cologuard.  Schedule diabetic eye exam.  Restart amlodipine  and losartan .  Consider Repatha injection medicine for cholesterol.  Return in 3-4 months for follow up visit.

## 2024-10-01 NOTE — Assessment & Plan Note (Signed)
 Persistent hyperglycemia despite 5 drug regimen.  Appreciate endo care. He decided to stop GIP/GLP1 due to intolerance.

## 2024-10-01 NOTE — Assessment & Plan Note (Addendum)
 BP markedly elevated in setting of missed amlodipine  doses. Will refill, continue this and losartan  100mg  daily.  RTC 58mo HTN f/u.

## 2024-10-01 NOTE — Assessment & Plan Note (Signed)
CPAP intolerant. 

## 2024-10-01 NOTE — Assessment & Plan Note (Signed)
 Levels low - continue D2 50k units weekly.

## 2024-10-01 NOTE — Assessment & Plan Note (Addendum)
 Chronic, he stopped allopurinol  without recurrence.

## 2024-10-01 NOTE — Assessment & Plan Note (Signed)
 Followed by endo.

## 2024-10-01 NOTE — Assessment & Plan Note (Addendum)
 See above. Persists despite fibrate and statin.  Likely due to hyperglycemia.  Reviewed increased risk of pancreatitis.

## 2024-10-01 NOTE — Progress Notes (Signed)
 Ph: [phone] Fax: [phone]   Patient ID: Kyle [name], male    DOB: 02-29-1976, 48 y.o.   MRN: [id number]  This visit was conducted in person.  BP (!) 168/106 (BP Location: Right Arm, Cuff Size: Large)   Pulse 85   Temp 97.8 F (36.6 C) (Oral)   Ht 6' 1.5 (1.867 m)   Wt 254 lb 2 oz (115.3 kg)   SpO2 97%   BMI 33.07 kg/m   BP Readings from Last 3 Encounters:  10/01/24 (!) 168/106  08/18/24 132/88  05/05/24 130/80    CC: CPE Subjective:   HPI: Kyle [name] is a 47 y.o. male presenting on 10/01/2024 for Annual Exam   He notes difficulty keeping up with is medications.   Family stressors - dealing with mom with dementia and son with legal troubles. Got married several months ago.   Has had missed doses of amlodipine  - ran out.   Notes worsening muscle and joint pains - mostly legs (calves, thighs). He is on high potency statin through endo. No leg weakness or numbness. Notes left knee discomfort worse as day goes on.    Saw cardiology 2022 - CT coronary angio 07/2021 showing mild nonobstructive CAD with CAC score of 2.1 (77%ile). No recent chest discomfort.    DM - followed by endo Kyle [name] last seen 07/2024. On jardiance  25mg  daily, glipizide  20mg  daily, actos  30mg  daily, tresiba  24u daily and metformin  XR 1000mg  daily. Tried mounjaro  caused bad abd cramping and anorexia so he stopped - also wife had bowel obstruction on Kyle [name].   Dyslipidemia with marked elevated triglycerides - on rosuvastatin  40mg  and fenofibrate  145mg  daily.   Gout - he stopped allopurinol  300mg  about 1 yr ago - no recurrent gout flares.    Preventative:   Colon cancer screening - colonoscopy 2007. was rescheduled last year - had to cancer due to getting COVID. Overdue - offered cologuard Prostate cancer in father age 49 - father passed away from this (mets) - continue yearly PSA Lung cancer screening - not eligible  Flu shot yearly Td 2009, Tdap 2019 Pneumovax 2015, prevnar-20  today COVID vaccine - Pfizer 01/2020, 02/2020, no boosters Shingrix - discussed, to consider Seat belt use discussed  Sunscreen use discussed. No changing moles.  Non smoker  Alcohol - 2-3 beers/week  Dentist Q6 mo  Eye exam yearly - DUE  Caffeine: 4 cups coffee/day Kyle [name]. First wife passed from COVID 11/2019. Lives with 2 children (adopted). New 2nd wife Occupation: production designer, theatre/television/film at [employer] [place] Activity: walks dogs daily Diet: good water daily, fruits/vegetables some      Relevant past medical, surgical, family and social history reviewed and updated as indicated. Interim medical history since our last visit reviewed. Allergies and medications reviewed and updated. Outpatient Medications Prior to Visit  Medication Sig Dispense Refill   empagliflozin  (JARDIANCE ) 25 MG TABS tablet Take 1 tablet (25 mg total) by mouth daily. 90 tablet 3   glucose blood (ACCU-CHEK GUIDE) test strip 1 each by Other route in the morning and at bedtime. Use as instructed 200 each 3   insulin  degludec (TRESIBA  FLEXTOUCH) 100 UNIT/ML FlexTouch Pen Inject 24 Units into the skin daily. 30 mL 4   Insulin  Pen Needle 32G X 4 MM MISC 1 Device by Does not apply route daily in the afternoon. 100 each 3   metFORMIN  (GLUCOPHAGE -XR) 500 MG 24 hr tablet TAKE 2 TABLETS(1000 MG) BY MOUTH DAILY WITH BREAKFAST 180 tablet 1  pioglitazone  (ACTOS ) 30 MG tablet Take 1 tablet (30 mg total) by mouth daily. 90 tablet 3   rosuvastatin  (CRESTOR ) 40 MG tablet Take 1 tablet (40 mg total) by mouth daily. 90 tablet 3   amLODipine  (NORVASC ) 10 MG tablet TAKE 1 TABLET BY MOUTH DAILY 90 tablet 4   Cholecalciferol (VITAMIN D3) 1.25 MG (50000 UT) TABS Take 1 tablet by mouth once a week. 12 tablet 4   fenofibrate  (TRICOR ) 145 MG tablet Take 1 tablet (145 mg total) by mouth daily. 90 tablet 3   glipiZIDE  (GLUCOTROL ) 10 MG tablet Take 2 tablets (20 mg total) by mouth 2 (two) times daily before a meal. (Patient taking differently: Take  20 mg by mouth daily before breakfast.) 360 tablet 3   losartan  (COZAAR ) 100 MG tablet TAKE 1 TABLET BY MOUTH DAILY 90 tablet 4   traZODone  (DESYREL ) 50 MG tablet TAKE 1 TABLET(50 MG) BY MOUTH AT BEDTIME AS NEEDED FOR SLEEP 90 tablet 0   glipiZIDE  (GLUCOTROL ) 10 MG tablet Take 2 tablets (20 mg total) by mouth daily before breakfast.     allopurinol  (ZYLOPRIM ) 300 MG tablet Take 1 tablet (300 mg total) by mouth daily. 90 tablet 4   Co-Enzyme Q10 100 MG CAPS Take 1 capsule by mouth daily.     Multiple Vitamins-Minerals (MULTIVITAMIN GUMMIES ADULT) CHEW Chew 1 tablet by mouth daily.     ondansetron  (ZOFRAN ) 4 MG tablet Take 1 tablet (4 mg total) by mouth every 8 (eight) hours as needed for nausea or vomiting. 20 tablet 0   tirzepatide  (MOUNJARO ) 2.5 MG/0.5ML Pen Inject 2.5 mg into the skin once a week.     No facility-administered medications prior to visit.     Per HPI unless specifically indicated in ROS section below Review of Systems  Constitutional:  Negative for activity change, appetite change, chills, fatigue, fever and unexpected weight change.  HENT:  Negative for hearing loss.   Eyes:  Negative for visual disturbance.  Respiratory:  Negative for cough, chest tightness, shortness of breath and wheezing.   Cardiovascular:  Negative for chest pain, palpitations and leg swelling.  Gastrointestinal:  Negative for abdominal distention, abdominal pain, blood in stool, constipation, diarrhea, nausea and vomiting.  Genitourinary:  Negative for difficulty urinating and hematuria.  Musculoskeletal:  Negative for arthralgias, myalgias and neck pain.  Skin:  Negative for rash.  Neurological:  Negative for dizziness, seizures, syncope and headaches.  Hematological:  Negative for adenopathy. Does not bruise/bleed easily.  Psychiatric/Behavioral:  Negative for dysphoric mood. The patient is not nervous/anxious.     Objective:  BP (!) 168/106 (BP Location: Right Arm, Cuff Size: Large)   Pulse  85   Temp 97.8 F (36.6 C) (Oral)   Ht 6' 1.5 (1.867 m)   Wt 254 lb 2 oz (115.3 kg)   SpO2 97%   BMI 33.07 kg/m   Wt Readings from Last 3 Encounters:  10/01/24 254 lb 2 oz (115.3 kg)  08/18/24 251 lb 12.8 oz (114.2 kg)  05/05/24 250 lb (113.4 kg)      Physical Exam Vitals and nursing note reviewed.  Constitutional:      General: He is not in acute distress.    Appearance: Normal appearance. He is well-developed. He is not ill-appearing.  HENT:     Head: Normocephalic and atraumatic.     Right Ear: Hearing, tympanic membrane, ear canal and external ear normal.     Left Ear: Hearing, tympanic membrane, ear canal and external ear normal.  Mouth/Throat:     Mouth: Mucous membranes are moist.     Pharynx: Oropharynx is clear. No oropharyngeal exudate or posterior oropharyngeal erythema.  Eyes:     General: No scleral icterus.    Extraocular Movements: Extraocular movements intact.     Conjunctiva/sclera: Conjunctivae normal.     Pupils: Pupils are equal, round, and reactive to light.  Neck:     Thyroid : No thyroid  mass or thyromegaly.  Cardiovascular:     Rate and Rhythm: Normal rate and regular rhythm.     Pulses: Normal pulses.          Radial pulses are 2+ on the right side and 2+ on the left side.     Heart sounds: Normal heart sounds. No murmur heard. Pulmonary:     Effort: Pulmonary effort is normal. No respiratory distress.     Breath sounds: Normal breath sounds. No wheezing, rhonchi or rales.  Abdominal:     General: Bowel sounds are normal. There is no distension.     Palpations: Abdomen is soft. There is no mass.     Tenderness: There is no abdominal tenderness. There is no guarding or rebound.     Hernia: No hernia is present.  Musculoskeletal:        General: Normal range of motion.     Cervical back: Normal range of motion and neck supple.     Right lower leg: No edema.     Left lower leg: No edema.  Lymphadenopathy:     Cervical: No cervical  adenopathy.  Skin:    General: Skin is warm and dry.     Findings: No rash.  Neurological:     General: No focal deficit present.     Mental Status: He is alert and oriented to person, place, and time.  Psychiatric:        Mood and Affect: Mood normal.        Behavior: Behavior normal.        Thought Content: Thought content normal.        Judgment: Judgment normal.       Results for orders placed or performed in visit on 09/29/24  Vitamin B12   Collection Time: 09/29/24  9:14 AM  Result Value Ref Range   Vitamin B-12 267 211 - 911 pg/mL  CBC with Differential/Platelet   Collection Time: 09/29/24  9:14 AM  Result Value Ref Range   WBC 6.5 4.0 - 10.5 K/uL   RBC 5.21 4.22 - 5.81 Mil/uL   Hemoglobin 15.3 13.0 - 17.0 g/dL   HCT 56.5 60.9 - 47.9 %   MCV 83.4 78.0 - 100.0 fl   MCHC 35.2 30.0 - 36.0 g/dL   RDW 86.5 88.4 - 84.4 %   Platelets 210.0 150.0 - 400.0 K/uL   Neutrophils Relative % 56.3 43.0 - 77.0 %   Lymphocytes Relative 31.8 12.0 - 46.0 %   Monocytes Relative 7.7 3.0 - 12.0 %   Eosinophils Relative 3.2 0.0 - 5.0 %   Basophils Relative 1.0 0.0 - 3.0 %   Neutro Abs 3.7 1.4 - 7.7 K/uL   Lymphs Abs 2.1 0.7 - 4.0 K/uL   Monocytes Absolute 0.5 0.1 - 1.0 K/uL   Eosinophils Absolute 0.2 0.0 - 0.7 K/uL   Basophils Absolute 0.1 0.0 - 0.1 K/uL  Uric acid   Collection Time: 09/29/24  9:14 AM  Result Value Ref Range   Uric Acid, Serum 6.3 4.0 - 7.8 mg/dL  VITAMIN D  25  Hydroxy (Vit-D Deficiency, Fractures)   Collection Time: 09/29/24  9:14 AM  Result Value Ref Range   VITD 20.03 (L) 30.00 - 100.00 ng/mL  Comprehensive metabolic panel with GFR   Collection Time: 09/29/24  9:14 AM  Result Value Ref Range   Sodium 134 (L) 135 - 145 mEq/L   Potassium 4.0 3.5 - 5.1 mEq/L   Chloride 98 96 - 112 mEq/L   CO2 27 19 - 32 mEq/L   Glucose, Bld 321 (H) 70 - 99 mg/dL   BUN 15 6 - 23 mg/dL   Creatinine, Ser 9.34 0.40 - 1.50 mg/dL   Total Bilirubin 0.7 0.2 - 1.2 mg/dL   Alkaline  Phosphatase 48 39 - 117 U/L   AST 17 0 - 37 U/L   ALT 28 0 - 53 U/L   Total Protein 6.9 6.0 - 8.3 g/dL   Albumin 4.7 3.5 - 5.2 g/dL   GFR 888.67 >39.99 mL/min   Calcium  9.3 8.4 - 10.5 mg/dL  Lipid panel   Collection Time: 09/29/24  9:14 AM  Result Value Ref Range   Cholesterol 167 0 - 200 mg/dL   Triglycerides (H) 0.0 - 149.0 mg/dL    359.9 Triglyceride is over 400; calculations on Lipids are invalid.   HDL 26.90 (L) >39.00 mg/dL   VLDL 871.9 (H) 0.0 - 40.0 mg/dL   Total CHOL/HDL Ratio 6    NonHDL 140.05   Hemoglobin A1c   Collection Time: 09/29/24  9:14 AM  Result Value Ref Range   Hgb A1c MFr Bld 9.7 (H) 4.6 - 6.5 %  LDL cholesterol, direct   Collection Time: 09/29/24  9:14 AM  Result Value Ref Range   Direct LDL 56.0 mg/dL    Assessment & Plan:   Problem List Items Addressed This Visit     Encounter for general adult medical examination with abnormal findings - Primary (Chronic)   Preventative protocols reviewed and updated unless pt declined. Discussed healthy diet and lifestyle.  Declines colonoscopy, agrees to Cologuard - ordered.  PSA added on, result pending      Gout   Chronic, he stopped allopurinol  without recurrence.       Essential hypertension   BP markedly elevated in setting of missed amlodipine  doses. Will refill, continue this and losartan  100mg  daily.  RTC 38mo HTN f/u.      Relevant Medications   amLODipine  (NORVASC ) 10 MG tablet   fenofibrate  (TRICOR ) 145 MG tablet   losartan  (COZAAR ) 100 MG tablet   OSA (obstructive sleep apnea)   CPAP intolerant.       Obesity, Class I, BMI 30.0-34.9 (see actual BMI)   Continue to encourage healthy diet and lifestyle choices to affect sustainable weight loss.       Dyslipidemia associated with type 2 diabetes mellitus (HCC)   Chronic, trig markedly elevated in setting of hyperglycemia.  Previous trial Vascepa  - unsure effect. Reviewed ASCVD risk as well as elevated CCS (77%) back in 2022.  Offered  PCSK9i - he declines further injectable medication at this time.  The 10-year ASCVD risk score (Arnett DK, et al., 2019) is: 10%   Values used to calculate the score:     Age: 74 years     Clincally relevant sex: Male     Is Non-Hispanic African American: No     Diabetic: Yes     Tobacco smoker: No     Systolic Blood Pressure: 132 mmHg     Is BP treated: Yes  HDL Cholesterol: 26.9 mg/dL     Total Cholesterol: 167 mg/dL       Relevant Medications   fenofibrate  (TRICOR ) 145 MG tablet   losartan  (COZAAR ) 100 MG tablet   glipiZIDE  (GLUCOTROL ) 10 MG tablet   Vitamin D  deficiency   Levels low - continue D2 50k units weekly.       CAD (coronary artery disease)   Continue statin.  Consider aspirin , consider PCSK9i - see above.       Relevant Medications   amLODipine  (NORVASC ) 10 MG tablet   fenofibrate  (TRICOR ) 145 MG tablet   losartan  (COZAAR ) 100 MG tablet   Hypertriglyceridemia   See above. Persists despite fibrate and statin.  Likely due to hyperglycemia.  Reviewed increased risk of pancreatitis.       Relevant Medications   amLODipine  (NORVASC ) 10 MG tablet   fenofibrate  (TRICOR ) 145 MG tablet   losartan  (COZAAR ) 100 MG tablet   Type 2 diabetes mellitus with hyperglycemia, with long-term current use of insulin  (HCC)   Persistent hyperglycemia despite 5 drug regimen.  Appreciate endo care. He decided to stop GIP/GLP1 due to intolerance.       Relevant Medications   fenofibrate  (TRICOR ) 145 MG tablet   losartan  (COZAAR ) 100 MG tablet   glipiZIDE  (GLUCOTROL ) 10 MG tablet   Microalbuminuria due to type 2 diabetes mellitus (HCC)   Followed by endo.       Relevant Medications   losartan  (COZAAR ) 100 MG tablet   glipiZIDE  (GLUCOTROL ) 10 MG tablet   Other Visit Diagnoses       Type 2 diabetes mellitus with hyperglycemia, without long-term current use of insulin  (HCC)       Relevant Medications   fenofibrate  (TRICOR ) 145 MG tablet   losartan  (COZAAR ) 100 MG  tablet   glipiZIDE  (GLUCOTROL ) 10 MG tablet     Special screening for malignant neoplasms, colon       Relevant Orders   Cologuard     Family hx of prostate cancer       Relevant Orders   PSA     Encounter for immunization       Relevant Orders   Flu vaccine trivalent PF, 6mos and older(Flulaval,Afluria,Fluarix,Fluzone) (Completed)   Pneumococcal conjugate vaccine 20-valent (Prevnar 20) (Completed)        Meds ordered this encounter  Medications   amLODipine  (NORVASC ) 10 MG tablet    Sig: TAKE 1 TABLET BY MOUTH DAILY    Dispense:  90 tablet    Refill:  3   fenofibrate  (TRICOR ) 145 MG tablet    Sig: Take 1 tablet (145 mg total) by mouth daily.    Dispense:  90 tablet    Refill:  3   losartan  (COZAAR ) 100 MG tablet    Sig: TAKE 1 TABLET BY MOUTH DAILY    Dispense:  90 tablet    Refill:  3   traZODone  (DESYREL ) 50 MG tablet    Sig: Take 1 tablet (50 mg total) by mouth at bedtime.    Dispense:  90 tablet    Refill:  3   Vitamin D , Ergocalciferol , (DRISDOL ) 1.25 MG (50000 UNIT) CAPS capsule    Sig: Take 1 capsule (50,000 Units total) by mouth every 7 (seven) days.    Dispense:  12 capsule    Refill:  3    Orders Placed This Encounter  Procedures   Flu vaccine trivalent PF, 6mos and older(Flulaval,Afluria,Fluarix,Fluzone)   Pneumococcal conjugate vaccine 20-valent (Prevnar 20)   Cologuard  PSA    Standing Status:   Future    Expiration Date:   10/01/2025    Patient Instructions  Flu shot and prevnar-20 pneumonia shot today.  Consider shingrix vaccines in the future. Medicines refilled.  I will sign you for Cologuard.  Schedule diabetic eye exam.  Restart amlodipine  and losartan .  Consider Repatha injection medicine for cholesterol.  Return in 3-4 months for follow up visit   Follow up plan: Return in about 3 months (around 12/30/2024) for follow up visit.  Anton Blas, MD

## 2024-10-02 ENCOUNTER — Other Ambulatory Visit: Payer: Self-pay | Admitting: Family Medicine

## 2024-10-02 ENCOUNTER — Ambulatory Visit: Payer: Self-pay | Admitting: Family Medicine

## 2024-10-02 DIAGNOSIS — I10 Essential (primary) hypertension: Secondary | ICD-10-CM

## 2024-10-02 LAB — PSA: PSA: 0.39 ng/mL (ref <=4.00)

## 2024-11-17 ENCOUNTER — Other Ambulatory Visit: Payer: Self-pay | Admitting: Internal Medicine

## 2024-11-17 ENCOUNTER — Other Ambulatory Visit: Payer: Self-pay | Admitting: Family Medicine

## 2024-11-17 DIAGNOSIS — M1A9XX0 Chronic gout, unspecified, without tophus (tophi): Secondary | ICD-10-CM

## 2024-11-19 ENCOUNTER — Other Ambulatory Visit: Payer: Self-pay

## 2024-11-19 DIAGNOSIS — E1165 Type 2 diabetes mellitus with hyperglycemia: Secondary | ICD-10-CM

## 2024-11-19 MED ORDER — METFORMIN HCL ER 500 MG PO TB24: ORAL_TABLET | ORAL | 1 refills | Status: AC

## 2024-11-22 ENCOUNTER — Ambulatory Visit: Admitting: Internal Medicine
